# Patient Record
Sex: Male | Born: 1950 | Race: White | Hispanic: No | Marital: Married | Smoking: Former smoker
Health system: Southern US, Community
[De-identification: ages and names within clinical notes are randomized; demographics above are authoritative.]

## PROBLEM LIST (undated history)

## (undated) DIAGNOSIS — D649 Anemia, unspecified: Secondary | ICD-10-CM

## (undated) DIAGNOSIS — I2510 Atherosclerotic heart disease of native coronary artery without angina pectoris: Secondary | ICD-10-CM

## (undated) DIAGNOSIS — N289 Disorder of kidney and ureter, unspecified: Secondary | ICD-10-CM

## (undated) DIAGNOSIS — N400 Enlarged prostate without lower urinary tract symptoms: Secondary | ICD-10-CM

## (undated) DIAGNOSIS — E1140 Type 2 diabetes mellitus with diabetic neuropathy, unspecified: Secondary | ICD-10-CM

## (undated) DIAGNOSIS — I219 Acute myocardial infarction, unspecified: Secondary | ICD-10-CM

## (undated) DIAGNOSIS — M109 Gout, unspecified: Secondary | ICD-10-CM

## (undated) DIAGNOSIS — G4730 Sleep apnea, unspecified: Secondary | ICD-10-CM

## (undated) DIAGNOSIS — I10 Essential (primary) hypertension: Secondary | ICD-10-CM

## (undated) DIAGNOSIS — Z9861 Coronary angioplasty status: Secondary | ICD-10-CM

## (undated) DIAGNOSIS — M1990 Unspecified osteoarthritis, unspecified site: Secondary | ICD-10-CM

## (undated) DIAGNOSIS — R0602 Shortness of breath: Secondary | ICD-10-CM

## (undated) DIAGNOSIS — E039 Hypothyroidism, unspecified: Secondary | ICD-10-CM

## (undated) DIAGNOSIS — Z91018 Allergy to other foods: Secondary | ICD-10-CM

## (undated) DIAGNOSIS — I5030 Unspecified diastolic (congestive) heart failure: Secondary | ICD-10-CM

## (undated) DIAGNOSIS — R600 Localized edema: Secondary | ICD-10-CM

## (undated) DIAGNOSIS — E7800 Pure hypercholesterolemia, unspecified: Secondary | ICD-10-CM

## (undated) DIAGNOSIS — N189 Chronic kidney disease, unspecified: Secondary | ICD-10-CM

## (undated) DIAGNOSIS — M549 Dorsalgia, unspecified: Secondary | ICD-10-CM

## (undated) HISTORY — DX: Type 2 diabetes mellitus with diabetic neuropathy, unspecified: E11.40

## (undated) HISTORY — DX: Allergy to other foods: Z91.018

## (undated) HISTORY — DX: Dorsalgia, unspecified: M54.9

## (undated) HISTORY — DX: Benign prostatic hyperplasia without lower urinary tract symptoms: N40.0

## (undated) HISTORY — PX: APPENDECTOMY: SHX54

## (undated) HISTORY — DX: Hypothyroidism, unspecified: E03.9

## (undated) HISTORY — DX: Acute myocardial infarction, unspecified: I21.9

## (undated) HISTORY — DX: Anemia, unspecified: D64.9

## (undated) HISTORY — PX: CATARACT EXTRACTION: SUR2

## (undated) HISTORY — DX: Essential (primary) hypertension: I10

## (undated) HISTORY — DX: Disorder of kidney and ureter, unspecified: N28.9

## (undated) HISTORY — DX: Chronic kidney disease, unspecified: N18.9

## (undated) HISTORY — DX: Gout, unspecified: M10.9

## (undated) HISTORY — DX: Localized edema: R60.0

## (undated) HISTORY — DX: Pure hypercholesterolemia, unspecified: E78.00

## (undated) HISTORY — DX: Shortness of breath: R06.02

## (undated) HISTORY — DX: Sleep apnea, unspecified: G47.30

## (undated) HISTORY — DX: Unspecified diastolic (congestive) heart failure: I50.30

---

## 2003-04-18 ENCOUNTER — Encounter: Payer: Self-pay | Admitting: Family Medicine

## 2003-04-18 ENCOUNTER — Ambulatory Visit (HOSPITAL_COMMUNITY): Admission: RE | Admit: 2003-04-18 | Discharge: 2003-04-18 | Payer: Self-pay | Admitting: Family Medicine

## 2003-08-17 ENCOUNTER — Ambulatory Visit (HOSPITAL_COMMUNITY): Admission: RE | Admit: 2003-08-17 | Discharge: 2003-08-17 | Payer: Self-pay | Admitting: Family Medicine

## 2003-08-17 ENCOUNTER — Encounter: Payer: Self-pay | Admitting: Family Medicine

## 2003-08-25 ENCOUNTER — Ambulatory Visit (HOSPITAL_COMMUNITY): Admission: RE | Admit: 2003-08-25 | Discharge: 2003-08-25 | Payer: Self-pay | Admitting: *Deleted

## 2003-10-28 HISTORY — PX: CORONARY ANGIOPLASTY WITH STENT PLACEMENT: SHX49

## 2004-05-08 ENCOUNTER — Inpatient Hospital Stay (HOSPITAL_COMMUNITY): Admission: AD | Admit: 2004-05-08 | Discharge: 2004-05-10 | Payer: Self-pay | Admitting: Cardiology

## 2004-05-08 ENCOUNTER — Encounter: Payer: Self-pay | Admitting: Emergency Medicine

## 2004-11-12 ENCOUNTER — Ambulatory Visit: Payer: Self-pay | Admitting: *Deleted

## 2004-12-13 ENCOUNTER — Ambulatory Visit: Payer: Self-pay | Admitting: *Deleted

## 2004-12-30 ENCOUNTER — Ambulatory Visit: Payer: Self-pay | Admitting: *Deleted

## 2005-02-14 ENCOUNTER — Ambulatory Visit: Payer: Self-pay | Admitting: *Deleted

## 2005-02-20 ENCOUNTER — Inpatient Hospital Stay (HOSPITAL_BASED_OUTPATIENT_CLINIC_OR_DEPARTMENT_OTHER): Admission: RE | Admit: 2005-02-20 | Discharge: 2005-02-20 | Payer: Self-pay | Admitting: Cardiology

## 2005-02-20 ENCOUNTER — Ambulatory Visit: Payer: Self-pay | Admitting: Cardiology

## 2005-03-04 ENCOUNTER — Ambulatory Visit: Payer: Self-pay | Admitting: Physician Assistant

## 2006-01-06 ENCOUNTER — Ambulatory Visit (HOSPITAL_COMMUNITY): Admission: RE | Admit: 2006-01-06 | Discharge: 2006-01-06 | Payer: Self-pay | Admitting: Family Medicine

## 2006-01-19 ENCOUNTER — Ambulatory Visit: Payer: Self-pay | Admitting: *Deleted

## 2007-04-12 ENCOUNTER — Ambulatory Visit: Payer: Self-pay | Admitting: Cardiovascular Disease

## 2010-11-17 ENCOUNTER — Encounter: Payer: Self-pay | Admitting: *Deleted

## 2011-03-11 NOTE — Assessment & Plan Note (Signed)
Brandon Warren CARDIOLOGY OFFICE NOTE   NAME:Brandon Warren                      MRN:          [id number]  DATE:04/12/2007                            DOB:          1951-08-27    [name] is seen today as a new patient to me.  It took me awhile to  review his chart.  He has a history of coronary artery disease with  previous stenting of the obtuse marginal branch in 2005.  He had a  followup catheterization in 2006 as part of a research trial and had  patent stents.  At the time of his initial evaluation he had tingling in  his arms and some chest pain as his anginal equivalent.  He has not had  any recurrences of this.  His coronary risk factors are reasonably well  modified.  Dr. Caron Presume has been following his lipid profile and his LDL  cholesterols have been under 50, with normal LFTs.  He is a nonsmoker.  He quit 4 years ago.  Unfortunately when he quit, his weight ballooned  by about 100 pounds.  We had a long discussion regarding his weight.  It  was fairly obvious to both of [place] that he is not very motivated to quit  as he says he loves to eat and celebrates everything with food.  He  asked me about lap-band procedures and I told him if he was convinced  that he was not going to change his habits that he should probably look  into this.  I think currently his cardiac status is stable enough to  proceed.  The patient had been on Plavix up until about a year ago, at  which time he stopped his Plavix due to cost considerations.  He wanted  me to review his medications and indeed wanted to be on generic as much  as possible.  I told him to talk to Dr. Caron Presume about the Atacand since  he seemed to specifically want the patient on this medicine and I told  him there was no generic for this.  In regards to his Toprol 25 mg  daily, I switched him to Lopressor 12.5 b.i.d.   REVIEW OF SYSTEMS:  His review of systems is  otherwise remarkable for  some lower extremity edema.  He has had Lasix in the past which seemed a  little too strong for him and caused lightheadedness. I told him I would  give him a prescription for hydrochlorothiazide and he could take 12.5  mg as needed.  We also talked about a low-salt diet and his weight is  being contributory to his edema.  Review of systems otherwise negative.   MEDICATIONS:  1. Lipitor 20 a day.  2. Plavix has been discontinued.  He takes an aspirin a day.  3. He will be on Lopressor 12.5 b.i.d.  4. Atacand 16 a day.  5. Synthroid 200 mcg a day.   PHYSICAL EXAMINATION:  His exam is remarkable for a morbidly obese white  male with an appropriate affect, in no distress.  His weight is 316  pounds.  This is actually up another 7 pounds from March of 2007.  Blood  pressure is 155/98.  He says he did not take his blood pressure  medicines this morning.  Respiratory rate is 18, pulse is 91 and  regular.  He is afebrile.  NECK:  Supple, with no thyromegaly, no lymphadenopathy, no bruits.  LUNGS:  Clear, without wheezing, normal diaphragmatic motion.  HEART:  There is an S1, S2, distant heart sounds, PMI is not palpable.  ABDOMEN:  Protuberant, no tenderness.  Bowel sounds are positive, no  hepatosplenomegaly, no hepatojugular reflux.  Abdominal aorta is not  palpable.  EXTREMITIES:  Femorals are deep and difficult to palpate.  There is no  obvious bruit.  He has +1 lower extremity edema bilaterally.  PTs were  +2.  NEURO:  Nonfocal.  MUSCULOSKELETAL:  Shows no weakness.   IMPRESSION:  1. Coronary disease, previous stent to the OM, continue aspirin      therapy.  Followup Myoview in a year.  2. Hypercholesterolemia.  Continue Lipitor 20 mg a day.  LDL within      goal with normal LFTs.  3. Hypertension.  Well controlled on Atacand.  He will continue this      as well as his beta blocker.  Blood pressure up today as he did not      take his medicines in the  morning.  4. Hypothyroidism.  Continue current dose of Synthroid, followup TSH      in 6 months.  5. Lower extremity edema secondary to significant obesity and salt      intake.  Hydrochlorothiazide on a p.r.n. basis.  6. Essential obesity.  The patient will entertain the idea of lap-band      procedure.  I think currently he could be cleared for this without      any further cardiac testing.  He has not been on Plavix for awhile      and has been doing well.  I will see him back in a year's time and      he will call me if he has any issues.     Brandon Warren. Brandon Cancel, MD, [facility]  Electronically Signed    PCN/MedQ  DD: 04/12/2007  DT: 04/12/2007  Job #: [id number]   cc:   Bonne Dolores, M.D.

## 2011-03-14 NOTE — H&P (Signed)
NAME:  Brandon Warren, Brandon Warren                         ACCOUNT NO.:  [id number]   MEDICAL RECORD NO.:  [id number]                   PATIENT TYPE:  INP   LOCATION:  3713                                 FACILITY:  [facility]   PHYSICIAN:  Signa Kell, M.D.             DATE OF BIRTH:  April 13, 1951   DATE OF ADMISSION:  05/08/2004  DATE OF DISCHARGE:                                HISTORY & PHYSICAL   PRIMARY CARE PHYSICIAN:  Bonne Dolores, M.D.   PRIMARY CARDIOLOGIST:  Scarlett Presto, M.D.   HISTORY OF PRESENT ILLNESS:  Brandon Warren is a 60 year old male with known  coronary artery disease with history of myocardial infarction approximately  13 years ago treated with percutaneous transluminal coronary angioplasty of  the left circumflex artery reducing at 95% stenosis to 20%.  At that time he  had normal left ventricular function.  Most recently he has been seen by Dr.  Wilhemina Warren in November of 2004 with complaints of increasing dyspnea on  exertion.  At that time he was scheduled for a stress Cardiolite for  evaluation, however, this was not performed.  The patient did note some  improvement over the following few months.  Approximately one to two months  ago he noted again increasing dyspnea on exertion.  He states that some days  he could do his job easily with no dyspnea, however, on other days he would  get very short of breath with walking up only four steps.  He states over  the last three days he has noted episodes of diaphoresis and shortness of  breath with exertion.  He has also noted presyncopal episode after bending  over and standing up.  He denies any chest discomfort with these episodes.  He has also been recently treated with Lasix on a PRN basis for peripheral  edema.  He presents to the emergency department today because he woke up  this morning and again noted some diaphoresis with shortness of breath.  Upon arrival to the emergency department he was still noted to be  diaphoretic, however, his dyspnea had resolved.  the patient was not given  any medications in the emergency department.  He was placed on telemetry and  had blood work drawn and also given oxygen therapy.  Chest x-ray reveals no  acute disease with mild chronic obstructive pulmonary disease.  Chest CT  scan reveals no PE.   PAST MEDICAL HISTORY:  Coronary artery disease as outlined above.  No  objective evaluation since his percutaneous transluminal coronary  angioplasty in 1993.  Hypothyroidism.  Hypertension.  Hyperlipidemia.  Clinical symptoms of obstructive sleep apnea, however, no formal sleep study  has been done and he is receiving no treatment with CPAP.   MEDICATIONS PRIOR TO ADMISSION:  1. Synthroid 150 mcg daily.  2. Lasix 20 mg every other day.  3. Effexor 75 mg daily.  4. Altace 10 mg daily.  5. Lipitor  20 mg daily.  6. Toprol 25 mg daily.  7. Aspirin 81 mg daily.   ALLERGIES:  No known drug allergies.   SOCIAL HISTORY:  The patient lives in [place], [place] with his  wife.  He works as a Water engineer.  He is married and has three children,  one of which is with him today and is a Music therapist.  He has  approximately a 50+ pack year history of smoking cigarettes and quit one  year ago.  He denies any alcohol or drug use.   FAMILY HISTORY:  Mother is alive at 14 years of age with hypertension.  Father is alive at 22 years old with history of myocardial infarction.  He  is an only child.   REVIEW OF SYMPTOMS:  Positive for diaphoresis.  No fevers or chills.  HEENT:  No congestion, vision or hearing changes.  SKIN:  No rashes or lesions are  noted.  CARDIOPULMONARY:  Refer to history of present illness.  GENITOURINARY:  No frequency or dysuria.  NEUROPSYCHIATRIC:  Generalized  weakness with episode quickly resolves.  MUSCULOSKELETAL:  No myalgias or  arthralgias.  He does have some occasional abdominal cramping and occasional  leg cramps as well.   GASTROINTESTINAL:  Some nausea and some occasional  diarrhea.  No melena or bright red blood per rectum is noted.  He has noted  increase in his weight of approximately 65 pounds since he quit smoking a  year ago.  All other systems reviewed were negative.   PHYSICAL EXAMINATION:  VITAL SIGNS:  Temperature 97.8, pulse 89,  respirations 18, blood pressure 141/90.  Oxygen saturation 97% on 2 liters.  GENERAL:  This is a well-developed, well-nourished male in no acute  distress.  HEENT:  Normocephalic, atraumatic.  Pupils equal, round, reactive to light.  Extraocular movements intact.  NECK: No jugular venous distention.  No carotid bruits.  No lymphadenopathy  noted.  CARDIOVASCULAR:  S1 and S2 normal.  Regular rate and rhythm.  No murmurs,  rubs or gallops are appreciated.  LUNGS:  Clear to auscultation bilaterally.  ABDOMEN:  Obese, soft, nontender with active bowel sounds.  SKIN:  Warm and dry with no rashes noted.  EXTREMITIES:  No cyanosis, clubbing or edema.  Distal pulses intact  bilaterally.  GENITOURINARY/RECTAL:  Examination's are deferred.  MUSCULOSKELETAL:  No joint deformity or effusions are noted.  NEUROLOGICAL:  Patient is alert and oriented X3 and cranial nerves II-XII  are grossly intact.   CLINICAL DATA:  Chest CT scan reveals no PE.  He does have a few scattered  subpleural nodular foci, nonspecific, recommend a follow up CT scan in three  months.  Chest x-ray with no acute disease, mild chronic obstructive  pulmonary disease.   Electrocardiogram reveals sinus rhythm at 86 beats per minute with normal  axis deviation, no PR interval, QRS duration and QTC.  He does have some  nonspecific ST abnormalities.  No change from previous electrocardiogram  dated October 2004.   LABORATORY DATA:  White blood cell count 8.5, hemoglobin 15.9, hematocrit 45.3, platelet count 315,000.  Sodium 134, potassium 4.1, chloride 100, cO2  23, BUN 11, creatinine 1.0, glucose 119, total  bilirubin 0.6, AST 30, ALT  42, alkaline phosphatase 81, total protein 7.6, albumin 4.3, calcium 9.7, D-  dimer 0.36. BNP level is 31.7.  ABG's show pH 7.42, pCO2 37.0, pO2 76.1,  bicarb 23.7, SAO2 95.4% on room air.  Point of care markers:  Myoglobin  216/335/240;  CK-MB 2.0/2.1/2.0; troponin less than 0.05 X3.   IMPRESSION AND PLAN:  PROBLEM #1:  This is a 60 year old obese white male  with history of inferior wall myocardial infarction in 1993 treated with  percutaneous transluminal coronary angioplasty of a 95% occluded circumflex  reducing the stenosis to 20%.  He has recurrent dyspnea on exertion with  diaphoresis and some presyncope which is likely orthostatic.  He denies any  chest discomfort.  He has also gained approximately 65 pounds over the last  one year since he discontinued tobacco which could be contributing to his  decreased exercise tolerance.  Considering his history of coronary artery  disease and no objective evaluation for ischemia since 1993, we will admit  him to the hospital, obtain serial cardiac enzymes to rule out acute  myocardial infarction.  D-dimer is within normal limits as is his BNP.  Will  go ahead and schedule for a cardiac catheterization in the morning for  further evaluation.  Will continue him on his home medications including  aspirin.  He will be given nitroglycerin as needed for chest discomfort.   PROBLEM #2:  HYPERTENSION:  Will continue current home medications and  continue to monitor.   PROBLEM #3: HYPOTHYROIDISM:  Will continue Synthroid at his current dose and  check a TSH level.   PROBLEM #4: HYPERLIPIDEMIA:  Will continue on Lipitor.   Patient was interviewed and examined by Dr. Coralie Keens.  He agrees with the  above assessment and plan.      Amy Nelida Gores, P.A. [facility]                     Signa Kell, M.D.    AB/MEDQ  D:  05/08/2004  T:  05/08/2004  Job:  [id number]

## 2011-03-14 NOTE — Cardiovascular Report (Signed)
Brandon Warren, Brandon Warren NO.:  [id number]   MEDICAL RECORD NO.:  [id number]          PATIENT TYPE:  OIB   LOCATION:  6501                         FACILITY:  [facility]   PHYSICIAN:  Ethelle Lyon, M.D. LHCDATE OF BIRTH:  May 13, 1951   DATE OF PROCEDURE:  02/20/2005  DATE OF DISCHARGE:                              CARDIAC CATHETERIZATION   PROCEDURE:  1.  Coronary angiography.  2.  Intravascular ultrasound of the circumflex for follow up in the Zomaxx      study, StarClose closure of the right common arteriotomy site.   INDICATION:  [name] is a 61 year old gentleman who underwent stenting of  the first obtuse marginal in July 2005.  At that time, he consented to  participate in the Zomaxx trial.  He is now schedule for return angiography  and intravascular ultrasound.   TECHNIQUE:  Informed consent was obtained.  Under 1% lidocaine local  anesthesia, a 6 French sheath was placed in the right common femoral artery  using the modified Seldinger technique.  Diagnostic angiography was  performed using JR4 catheter for the native right coronary and a 6 French  EBU 3.5 guide for the native left system.  Intracoronary nitroglycerin was  administered before angiography of the left system.  Anticoagulation was  then initiated with unfractionated heparin to achieve and maintain an ACT of  greater than 275 seconds.  A Luge wire was advanced to the obtuse marginal  without difficulty.  Intravascular ultrasound probe was then advanced beyond  the stent.  Automated pull-back was performed after the administration of  intracoronary nitroglycerin.   The arteriotomy was then closed using a 6 [place] StarClose device.  Complete  hemostasis was obtained.  The patient did have a vagal episode treated with  1 mg of atropine with prompt improvement.  He was then transferred to the  holding room in stable condition.   COMPLICATIONS:  None.   FINDINGS:  1.  Left main:   Angiographically normal.  2.  LAD:  Ostial 20% stenosis.  The vessel was fairly large giving rise to a      single small diagonal.  3.  Ramus intermedius:  Large vessel which is angiographically normal.  4.  Circumflex:  Moderate size vessel giving rise to a single large obtuse      marginal.  This marginal has a stent in its proximal segment.  The stent      has less than 10% end-stent restenosis.  There is approximately 15%      restenosis just proximal to the stent.  5.  RCA:  Relatively small though dominant vessel.  It is angiographically      normal.   IMPRESSION/PLAN:  Widely patent stent in the first obtuse marginal.  There  is minimal restenosis.  We will continue with current therapy.      WED/MEDQ  D:  02/20/2005  T:  02/20/2005  Job:  [id number]   cc:   Scarlett Presto, M.D.  Fax: [phone]   Bonne Dolores, M.D.  [address], [place]  [place] [address]  Fax: [phone]

## 2011-03-14 NOTE — Procedures (Signed)
   NAME:  Brandon Warren, Brandon Warren                         ACCOUNT NO.:  [id number]   MEDICAL RECORD NO.:  [id number]                   PATIENT TYPE:  OUT   LOCATION:  RAD                                  FACILITY:  [facility]   PHYSICIAN:  Scarlett Presto, M.D.                DATE OF BIRTH:  [birth year]   DATE OF PROCEDURE:  DATE OF DISCHARGE:                                  ECHOCARDIOGRAM   REFERRING PHYSICIAN:  Bonne Dolores, M.D.   TAPE [id number], tape count [id number].   HISTORY:  This is a 60 year old male with edema, shortness of breath,  hypertension, coronary artery disease, and a history of myocardial  infarction.  The technical quality of the study is limited due to the  patient's habitus.   M-MODE TRACINGS:  1. The aorta is 30 mm.  2. The left atrium is 39 mm.  3. The septum is 16 mm.  4. Posterior wall is 11 mm.  5. The left ventricular diastolic dimension is 46 mm.  6. The left ventricular systolic dimension is 30 mm.   2-D AND DOPPLER IMAGING:  The left ventricle is normal size with mild  concentric left ventricular hypertrophy.  There are no wall motion  abnormalities seen.  Diastolic function is mildly impaired as per the  transmitral Doppler.  The estimated ejection fraction is 60 to 65%.   The right ventricle is normal size with normal systolic function.   Both atria are normal size with no obvious atrial septal defect.   The aortic valve is mildly sclerotic with no evidence of stenosis or  regurgitation.   The mitral valve is morphologically unremarkable with trace to mild  insufficiency.  No stenosis is seen.   The pulmonic valve is not well seen.   The tricuspid valve is morphologically unremarkable with no stenosis or  regurgitation.   The ascending aorta appears normal.   The inferior vena cava is not well seen.   The pericardial structures were not well seen.      ___________________________________________    Scarlett Presto, M.D.   JH/MEDQ  D:  08/25/2003  T:  08/25/2003  Job:  [id number]

## 2011-03-14 NOTE — Cardiovascular Report (Signed)
NAME:  Brandon Warren, Brandon Warren                         ACCOUNT NO.:  [id number]   MEDICAL RECORD NO.:  [id number]                   PATIENT TYPE:  INP   LOCATION:  6531                                 FACILITY:  [facility]   PHYSICIAN:  Ethelle Lyon, M.D. [facility]         DATE OF BIRTH:  1950/11/21   DATE OF PROCEDURE:  05/08/2004  DATE OF DISCHARGE:                              CARDIAC CATHETERIZATION   PROCEDURE:  Drug-eluting stent placement in the first obtuse marginal as  part of the Zomax-2 trial.   INDICATION:  Unstable angina.   PROCEDURAL TECHNIQUE:  Informed consent had been obtained by Dr. Minus Breeding prior to diagnostic angiography.  His diagnostic angiography today  demonstrated an 80% stenosis of the first-obtuse marginal.  Percutaneous  intervention was recommended, and I was consulted.   Anticoagulation was initiated with heparin and double bolus eptifibatide.  Plavix 300 mg was administered.  The preexisting 5-French sheath was upsized  to a 6-French sheath over a wire.  ACLS 3.5 guide was advanced over a wire  and engaged in the ostium of the left main.  A Luge wire was advanced to the  distal vessel without difficulty.  Repeat angiography was performed after  the administration of 200 mcg of intracoronary nitroglycerin.  The lesion  was then pre dilated using a  2.25 x 9 mm Maverick at 6 atmospheres.  The lesion was then stented using a  Zomax study stent which was 2.5 x16 mm.  It was deployed at 16 atmospheres.  The entirety of the stent was then post-dilated using a 2.5 x13 mm Power-  Sail for two sequential inflations, both at 16 atmospheres.  Intravascular  ultrasound was then performed using Galaxy system and automated pullback.  This demonstrated the stent to be less than fully expanded, and a region of  calcification in its proximal two-thirds.  The Power-Sail was then  reintroduced and used for further post-dilatation at 20 atmospheres for two  sequential  inflations covering the entirety of the proximal and mid portion  of the stent, sparring the distal 2 mm of the stent.  Repeat IVUS was then  performed.  This now showed that the stent was fully apposed and fully  expanded.  There was no evidence of dissection.  Final angiography after the  administration of 100 micrograms of intracoronary nitroglycerin demonstrated  no residual stenosis and TIMI 3 flow to the distal vasculature.   The patient tolerated the procedure well and was transferred to the holding  room in stable condition.   IMPRESSION/ PLAN:  Successful drug-eluting stent placement reducing the  stenosis from 80% to 0%.  The patient should be continued on Plavix for a  minimum of six months and preferably one year given his acute presentation.  He will be continued on aspirin indefinitely.  Eptifibatide will be  continued for 18 hours.  The sheaths will be removed with the ACT is less  than  150 seconds.                                               Ethelle Lyon, M.D. [facility]    WED/MEDQ  D:  05/09/2004  T:  05/10/2004  Job:  [id number]   cc:   Bonne Dolores, M.D.  [address] [place] [address]  Fax: [phone]   E. Raymond Gurney, M.D.

## 2011-03-14 NOTE — Discharge Summary (Signed)
NAME:  Brandon Warren, Brandon Warren                         ACCOUNT NO.:  [id number]   MEDICAL RECORD NO.:  [id number]                   PATIENT TYPE:  INP   LOCATION:  6531                                 FACILITY:  [facility]   PHYSICIAN:  Ernestine Mcmurray, M.D. [facility]             DATE OF BIRTH:  [birth year]   DATE OF ADMISSION:  05/08/2004  DATE OF DISCHARGE:  05/10/2004                                 DISCHARGE SUMMARY   DISCHARGE DIAGNOSES:  1. Coronary artery disease.     a. Unstable angina treated with drug-eluting stent to the first obtuse        marginal (ZOMAX II trial).     b. History of myocardial infarction in 1993 treated with percutaneous        transluminal coronary angioplasty to the circumflex.  2. Good left ventricular function.  3. Probable sleep apnea (needs outpatient referral).  4. Hypertension.  5. Hypothyroidism.  6. Dyslipidemia.  7. Nonspecific nodular foci noted by chest CT.     a. Needs follow-up CT in three months to assess significance.   PROCEDURES PERFORMED THIS ADMISSION:  1. Cardiac catheterization by Dr. Percival Spanish.  Please see his dictated note     for complete details.  2. Percutaneous coronary intervention by Dr. Albertine Patricia with placement of drug-     eluting stent to the first obtuse marginal as part of the [place]:  Please see the admission history and physical for complete  details.  Briefly, this 60 year old male patient with a known history of  coronary artery disease presented to [facility] with complaints of  dyspnea on exertion and associated diaphoresis.  He ruled out for myocardial  infarction.  D-dimer was negative.  BNP was negative.  Chest CT revealed no  evidence of pulmonary embolism but a few scattered subpleural nodular foci,  which were nonspecific, and a follow-up CT was recommended in three months.  His circumflex showed no evidence of active disease, mild COPD, an do  definite edema or cardiomegaly.  Dr. Velora Heckler  assessed the patient and  transferred him to [facility] for cardiac catheterization.  He also  noted that the patient was having symptoms consistent with obstructive sleep  apnea.  He will need outpatient referral.   At [facility] the patient underwent cardiac catheterization by Dr.  Percival Spanish.  This revealed a high-grade circumflex lesion, and Dr. Albertine Patricia  performed drug-eluting stent placement to the first obtuse marginal as noted  above.  He tolerated the procedure well and had no immediate complications.  On the morning of May 10, 2004, he was in stable condition.  His right  groin was without hematomas or bruits.  Postprocedure CK-MBs were negative.  He was discharged to home in stable condition.  He will need to be on Plavix  and aspirin 325 mg daily for 12 months as part of the ZOMAX-2 trial.  He  will need to follow up with Dr.  Wilhemina Cash in [place] in the next two weeks.  We will need to set him up for a sleep study referral at that time to rule  out sleep apnea.  He will need to see Dr. Caron Presume in the next two to three  weeks.   LABORATORY DATA:  White count 7400, hemoglobin 13, hematocrit 37.6, platelet  count 216,000.  INR 0.9.  D-dimer 0.36.  Sodium 139, potassium 3.7, chloride  108, CO2 26, glucose 130, BUN 16, creatinine 1, calcium 8.  Total bilirubin  0.7, alkaline phosphatase 78, AST 33, ALT 47, total protein 8, albumin 4.4.  Cardiac enzymes negative.  TSH 4.031.  Chest x-ray and chest CT as noted  above.   DISCHARGE MEDICATIONS:  1. Plavix 75 mg daily.  2. Coated aspirin 325 mg daily.  3. Synthroid 0.15 mg daily.  4. Effexor 75 mg daily.  5. Altace 10 mg daily.  6. Lipitor 20 mg daily.  7. Toprol XL 25 mg daily.  8. Lasix 20 mg daily.  9. Nitroglycerin p.r.n. chest pain.   PAIN MANAGEMENT:  Tylenol as needed.  Nitroglycerin p.r.n. chest pain.  He  is to call the office of 911 for recurrent chest pain.   ACTIVITY:  No driving, heavy lifting,  exertion, or work for three days.   DIET:  Low fat, low sodium.   WOUND CARE:  The patient is to call our office in [place] for any groin  swelling, bleeding, or bruising.   SPECIAL INSTRUCTIONS:  The patient has been enrolled in the ZOMAX-2 study  and should receive aspirin 325 mg and Plavix 75 mg daily for 12 months.  Follow-up will be with Dr. Wilhemina Cash in two weeks.  The office will contact him  with an appointment.  He will need to be set up for sleep study referral at  that time to rule out sleep apnea.  The patient should see his primary care  physician, Dr. Caron Presume, in the next two to three weeks, and he should call  for an appointment.  As noted above, he will need follow-up chest CT in  three months.      Richardson Dopp, P.A.                        Ernestine Mcmurray, M.D. [facility]    SW/MEDQ  D:  05/10/2004  T:  05/10/2004  Job:  [id number]   cc:   Scarlett Presto, M.D.  Fax: [phone]   Bonne Dolores, M.D.  [address], [place]  [place] [address]  Fax: [phone]

## 2011-03-14 NOTE — Cardiovascular Report (Signed)
NAME:  Brandon Warren, Brandon Warren                         ACCOUNT NO.:  [id number]   MEDICAL RECORD NO.:  [id number]                   PATIENT TYPE:  INP   LOCATION:  6531                                 FACILITY:  [facility]   PHYSICIAN:  Minus Breeding, M.D.                DATE OF BIRTH:  [birth year]   DATE OF PROCEDURE:  05/09/2004  DATE OF DISCHARGE:                              CARDIAC CATHETERIZATION   PRIMARY CARE PHYSICIAN:  Bonne Dolores, M.D.   PROCEDURE:  Left heart catheterization, coronary arteriography.   INDICATION:  Evaluate patient with unstable angina.  He had previous  percutaneous transluminal coronary angioplasty of the circumflex lesion in  1993.   PROCEDURE NOTE:  Left heart catheterization was performed via the right  femoral artery.  The artery was cannulated using a Smart needle.  A 5 French  arterial sheath was inserted via the modified Seldinger technique.  Preformed Judkins and a pigtail catheter were utilized.  The patient  tolerated the procedure.  He did have a vagal episode at the beginning of  the procedure, but recovered from this quickly.   RESULTS:   HEMODYNAMICS:  1. LV 136/14.  2. Aortic 138/84.   CORONARIES:  The left main had distal 25% stenosis with calcification.  The  LAD had mid long 25% stenosis and was calcified.  There were distal luminal  irregularities.  A mid diagonal was moderate size and normal.  The  circumflex and the proximal AV groove had luminal irregularities.  The  remainder of the vessel in the AV groove was small after mid obtuse  marginal.  There was a very large ramus intermediate which was branching and  had luminal irregularities.  There was large mid obtuse marginal with mid  90% stenosis.   Right coronary artery:  The right coronary artery is dominant though not  particularly large vessel.  There was a large RV branch.  There was small  PDA and posterior lateral.  This was a normal vessel.   LEFT VENTRICULOGRAM:  Left  ventriculogram was obtained in the RAO  projection.  The EF was 65% with normal wall motion.   CONCLUSION:  1. Severe single vessel coronary artery disease.  2. Well preserved ejection fraction.   PLAN:  I will review the films with Dr. Albertine Patricia to discuss percutaneous  revascularization of the circumflex.  The patient will have secondary risk  reduction.                                               Minus Breeding, M.D.    JH/MEDQ  D:  05/09/2004  T:  05/09/2004  Job:  [id number]   cc:   Bonne Dolores, M.D.  [address], [place] [address]  Fax: [phone]  [facility] in [place]

## 2013-05-02 DIAGNOSIS — H53001 Unspecified amblyopia, right eye: Secondary | ICD-10-CM | POA: Insufficient documentation

## 2013-05-02 DIAGNOSIS — H25819 Combined forms of age-related cataract, unspecified eye: Secondary | ICD-10-CM | POA: Insufficient documentation

## 2013-05-02 DIAGNOSIS — H53009 Unspecified amblyopia, unspecified eye: Secondary | ICD-10-CM | POA: Insufficient documentation

## 2013-05-19 ENCOUNTER — Encounter: Payer: Self-pay | Admitting: *Deleted

## 2013-05-19 DIAGNOSIS — E785 Hyperlipidemia, unspecified: Secondary | ICD-10-CM

## 2013-05-19 DIAGNOSIS — I25119 Atherosclerotic heart disease of native coronary artery with unspecified angina pectoris: Secondary | ICD-10-CM | POA: Insufficient documentation

## 2013-05-19 DIAGNOSIS — I10 Essential (primary) hypertension: Secondary | ICD-10-CM | POA: Insufficient documentation

## 2013-05-19 DIAGNOSIS — E669 Obesity, unspecified: Secondary | ICD-10-CM

## 2013-05-19 DIAGNOSIS — I25810 Atherosclerosis of coronary artery bypass graft(s) without angina pectoris: Secondary | ICD-10-CM

## 2013-05-19 DIAGNOSIS — R739 Hyperglycemia, unspecified: Secondary | ICD-10-CM | POA: Insufficient documentation

## 2013-05-19 DIAGNOSIS — E039 Hypothyroidism, unspecified: Secondary | ICD-10-CM | POA: Insufficient documentation

## 2013-05-20 ENCOUNTER — Encounter: Payer: Self-pay | Admitting: Cardiovascular Disease

## 2013-05-20 ENCOUNTER — Ambulatory Visit (INDEPENDENT_AMBULATORY_CARE_PROVIDER_SITE_OTHER): Payer: BC Managed Care – PPO | Admitting: Cardiovascular Disease

## 2013-05-20 VITALS — BP 118/79 | HR 52 | Ht 71.0 in | Wt 257.5 lb

## 2013-05-20 DIAGNOSIS — R42 Dizziness and giddiness: Secondary | ICD-10-CM

## 2013-05-20 DIAGNOSIS — R001 Bradycardia, unspecified: Secondary | ICD-10-CM | POA: Insufficient documentation

## 2013-05-20 DIAGNOSIS — I498 Other specified cardiac arrhythmias: Secondary | ICD-10-CM

## 2013-05-20 DIAGNOSIS — I10 Essential (primary) hypertension: Secondary | ICD-10-CM

## 2013-05-20 DIAGNOSIS — I25810 Atherosclerosis of coronary artery bypass graft(s) without angina pectoris: Secondary | ICD-10-CM

## 2013-05-20 NOTE — Patient Instructions (Addendum)
Your physician recommends that you schedule a follow-up appointment in: [place] physician has recommended you make the following change in your medication:   1) DECREASE METOPROLOL TO 12.5MG  TWICE DAILY (CUT TABLET IN HALF)

## 2013-05-20 NOTE — Progress Notes (Signed)
Patient ID: Brandon Warren, male   DOB: 01/13/1951, 62 y.o.   MRN: [id number]    CARDIOLOGY CONSULT NOTE  Patient ID: Brandon Warren MRN: [id number] DOB/AGE: [birth year] 62 y.o.  Admit date: (Not on file) Primary Physician: Elsie Lincoln, MD Reason for Consultation: CAD, bradycardia  HPI:  Brandon Warren is a 62 y.o. Gentleman with a history of coronary artery disease with  previous stenting of the obtuse marginal branch in 2005 with a Taxus DES, which occurred when he was 62 yrs old. He had a followup catheterization in 2006 as part of a research trial and had  patent stents. At the time of his initial evaluation he had tingling in  his arms and some chest pain as his anginal equivalent.   He has not had any recurrences of this. His coronary risk factors are reasonably well  modified. He has lost 46 lbs recently. He had previously put on a lot of weight when he was taking care of his wife, who was being treated for inflammatory breast cancer.  He eats 1200 calories or less a day for the past 3 months. If he's too overweight, he struggles with sciatica.  He is apparently intolerant of statins (muscle cramps).  He denies chest pain. He exercises on his exercise bike for an hour daily. He goes to the hospital every 3 days simply to climb stairs. He denies leg swelling. He denies palpitations.  He has a little bit of lightheadedness when he stands.  He's scheduled to undergo cataract surgery on Tuesday. 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   His father passed away two months ago, and had inner ear problems, and Brandon Warren thinks he has them too. He gets dizzy when he leans his head backwards and forwards.  His HR used to be in the low 80's at rest when he weighed more. Since the weight loss, his HR is in the 50's.   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  History   Social History  . Marital Status: Married    Spouse Name: N/A    Number of Children: N/A  . Years of Education: N/A   Occupational History  . Not on file.   Social History Main Topics  . Smoking status: Former Research scientist (life sciences)  . Smokeless tobacco: Not on file  . Alcohol Use: Not on file  . Drug Use: Not on file  . Sexually Active: Not on file   Other Topics Concern  . Not on file   Social History Narrative  . No narrative on file      (Not in a hospital admission)  Physical exam Blood pressure 118/79, pulse 52, height 5\' 11"  (1.803 m), weight 257 lb 8 oz (116.801 kg). General: NAD Neck: No JVD, no thyromegaly or thyroid nodule.  Lungs: Clear to auscultation bilaterally with normal respiratory effort. CV: Nondisplaced PMI.  Heart regular rhythm, bradycardic in 50's, S1/S2, no S3/S4, no murmur.  No peripheral edema.  No carotid bruit.  Normal pedal pulses.  Abdomen: Soft, nontender, no hepatosplenomegaly, no distention.  Skin: Intact without lesions or rashes.  Neurologic: Alert and oriented x 3.  Psych: Normal affect. Extremities: No clubbing or cyanosis.  HEENT: Normal.   Labs:   No results found for this basename: WBC, HGB, HCT, MCV, PLT  No results found for this basename: NA, K, CL, CO2, BUN, CREATININE, CALCIUM, LABALBU, PROT, BILITOT, ALKPHOS, ALT, AST, GLUCOSE,  in the last 168 hours No results found for this basename: CKTOTAL, CKMB, CKMBINDEX, TROPONINI    No results found for this basename: CHOL   No results found for this basename: HDL   No results found for this basename: LDLCALC   No results found for this basename: TRIG   No results found for this basename: CHOLHDL   No results found for this basename: LDLDIRECT     Lipids (7-10/2012): TC: 121 HDL: 45 LDL: 55 TG: 106  HbA1C: 5.6% TSH: 0.018  EKG: Sinus bradycardia, rate 53 bpm, axis within normal limits, intervals within normal limits, no acute  ST-T wave changes.     ASSESSMENT AND PLAN:  1. CAD with previous stenting of the OM: currently asymptomatic, and getting regular physical activity. He is on an ASA and a beta blocker, which I will reduce for his bradycardia. He is intolerant of statins (muscle cramps), and takes Zetia. I see no need for stress testing or echocardiography presently.  2. Bradycardia: I don't feel there's a need to obtain a Holter monitor at this time. I will simply reduce his Metoprolol to 12.5 mg bid. I've asked him to monitor his resting HR for the next month and to inform me of these values.  3. HTN: controlled on Amlodipine and Losartan.  4. Hypercholesterolemia: well controlled with Zetia.  5. Dizziness: may be due to inner ear disease. I've encouraged him to perform Epley maneuvers. Reducing his Metoprolol should also help.  Signed: Kate Sable, M.D., F.A.C.C. 05/20/2013, 2:52 PM

## 2013-05-23 ENCOUNTER — Encounter: Payer: Self-pay | Admitting: Cardiovascular Disease

## 2013-05-25 DIAGNOSIS — Z9849 Cataract extraction status, unspecified eye: Secondary | ICD-10-CM | POA: Insufficient documentation

## 2013-05-25 DIAGNOSIS — Z9842 Cataract extraction status, left eye: Secondary | ICD-10-CM | POA: Insufficient documentation

## 2013-06-08 DIAGNOSIS — Z961 Presence of intraocular lens: Secondary | ICD-10-CM | POA: Insufficient documentation

## 2013-06-30 ENCOUNTER — Telehealth: Payer: Self-pay | Admitting: Cardiovascular Disease

## 2013-06-30 MED ORDER — METOPROLOL TARTRATE 25 MG PO TABS: 12.5000 mg | ORAL_TABLET | Freq: Every morning | ORAL | Status: DC

## 2013-06-30 NOTE — Telephone Encounter (Signed)
Spoke to patient concerning lab/test results/instructions from provider. Patient understood.    

## 2013-06-30 NOTE — Telephone Encounter (Signed)
If he is taking Metoprolol 12.5 mg bid, he can reduce to 12.5 mg every morning.

## 2013-06-30 NOTE — Telephone Encounter (Signed)
Patient would like return phone call to discuss medications. / tgs

## 2013-06-30 NOTE — Telephone Encounter (Signed)
Pt states that he weighed 305 lbs in January, he now weighs 235 lbs. Since pt last was seen, he has lost another 10 lbs. Last appt was May 20, 2013. At that appt his HR was in the 50's and MD decreased metoprolol in 1/2. Pt states that it has been working but since losing the weight he might need at med change. Pt states he did not take meds last night or this morning and BP was 106/66 and heartrate of 52. Please advise.

## 2013-10-06 ENCOUNTER — Ambulatory Visit (INDEPENDENT_AMBULATORY_CARE_PROVIDER_SITE_OTHER): Payer: BC Managed Care – PPO | Admitting: Otolaryngology

## 2013-10-06 ENCOUNTER — Encounter (INDEPENDENT_AMBULATORY_CARE_PROVIDER_SITE_OTHER): Payer: Self-pay

## 2013-10-06 DIAGNOSIS — H903 Sensorineural hearing loss, bilateral: Secondary | ICD-10-CM

## 2013-12-27 ENCOUNTER — Telehealth: Payer: Self-pay

## 2013-12-27 NOTE — Telephone Encounter (Signed)
Pt was referred by Dr. Orson Ape for screening colonoscopy. He left Vm to call him.( Needs OV prior to TCS due to meds).

## 2014-01-23 NOTE — Telephone Encounter (Signed)
Called and lmom for a return call.

## 2014-01-24 ENCOUNTER — Encounter (INDEPENDENT_AMBULATORY_CARE_PROVIDER_SITE_OTHER): Payer: Self-pay

## 2014-01-24 ENCOUNTER — Encounter: Payer: Self-pay | Admitting: Gastroenterology

## 2014-01-24 ENCOUNTER — Ambulatory Visit (INDEPENDENT_AMBULATORY_CARE_PROVIDER_SITE_OTHER): Payer: 59 | Admitting: Gastroenterology

## 2014-01-24 VITALS — BP 131/77 | HR 79 | Temp 97.6°F | Ht 70.0 in | Wt 295.8 lb

## 2014-01-24 DIAGNOSIS — Z1211 Encounter for screening for malignant neoplasm of colon: Secondary | ICD-10-CM

## 2014-01-24 MED ORDER — PEG 3350-KCL-NA BICARB-NACL 420 G PO SOLR: 4000.0000 mL | ORAL | Status: DC

## 2014-01-24 NOTE — Patient Instructions (Signed)
We have scheduled you for a colonoscopy with Dr. Gala Romney.  Further recommendations to follow!

## 2014-01-24 NOTE — Progress Notes (Signed)
Primary Care Physician:  Leonides Grills, MD Primary Gastroenterologist:  Dr. Gala Romney   Chief Complaint  Patient presents with  . Colonoscopy    HPI:   Brandon Warren is a pleasant 63 year old male who presents today for a visit prior to scheduling a colonoscopy. No prior colonoscopy. No changes in bowel habits. No rectal bleeding, abdominal pain. Occasional indigestion related to food choices. No dysphagia.   Past Medical History  Diagnosis Date  . Hypertension   . Hypercholesterolemia   . Hypothyroidism   . MI (myocardial infarction)     in his 59s    Past Surgical History  Procedure Laterality Date  . Appendectomy    . Coronary angioplasty with stent placement    . Cataract extraction      Current Outpatient Prescriptions  Medication Sig Dispense Refill  . ALPRAZolam (XANAX) 0.5 MG tablet Take 0.5 mg by mouth at bedtime as needed for anxiety.      Marland Kitchen amLODipine (NORVASC) 5 MG tablet Take 5 mg by mouth 2 (two) times daily.      Marland Kitchen aspirin EC 81 MG tablet Take 81 mg by mouth daily.      . diazepam (VALIUM) 5 MG tablet Take 5 mg by mouth every 8 (eight) hours as needed for anxiety.      Marland Kitchen ezetimibe (ZETIA) 10 MG tablet Take 10 mg by mouth daily.      Marland Kitchen levothyroxine (SYNTHROID, LEVOTHROID) 200 MCG tablet Take 175 mcg by mouth daily before breakfast.       . losartan (COZAAR) 100 MG tablet Take 100 mg by mouth daily.      . metoprolol tartrate (LOPRESSOR) 25 MG tablet Take 0.5 tablets (12.5 mg total) by mouth every morning.  30 tablet  3  . tamsulosin (FLOMAX) 0.4 MG CAPS Take 0.4 mg by mouth daily.      . traZODone (DESYREL) 50 MG tablet Take 50 mg by mouth at bedtime.       No current facility-administered medications for this visit.    Allergies as of 01/24/2014 - Review Complete 01/24/2014  Allergen Reaction Noted  . Lipitor [atorvastatin]  05/19/2013  . Simvastatin  05/19/2013    Family History  Problem Relation Age of Onset  . Colon cancer Neg Hx   .  Colon polyps Mother     History   Social History  . Marital Status: Married    Spouse Name: N/A    Number of Children: N/A  . Years of Education: N/A   Occupational History  . self-employed    Social History Main Topics  . Smoking status: Former Research scientist (life sciences)  . Smokeless tobacco: Not on file  . Alcohol Use: Yes     Comment: occasional  . Drug Use: No  . Sexual Activity: Not on file   Other Topics Concern  . Not on file   Social History Narrative  . No narrative on file    Review of Systems: Gen: see HPI CV: Denies chest pain, heart palpitations, peripheral edema, syncope.  Resp: +DOE GI: see HPI GU : Denies urinary burning, urinary frequency, urinary hesitancy MS: sciatic pain, left hip pain  Derm: Denies rash, itching, dry skin Psych: anxiety Heme: Denies bruising, bleeding, and enlarged lymph nodes.  Physical Exam: BP 131/77  Pulse 79  Temp(Src) 97.6 F (36.4 C) (Oral)  Ht 5\' 10"  (1.778 m)  Wt 295 lb 12.8 oz (134.174 kg)  BMI 42.44 kg/m2 General:   Alert and oriented.  Pleasant and cooperative. Well-nourished and well-developed.  Head:  Normocephalic and atraumatic. Eyes:  Without icterus, sclera clear and conjunctiva pink.  Ears:  Normal auditory acuity. Nose:  No deformity, discharge,  or lesions. Mouth:  No deformity or lesions, oral mucosa pink.  Neck:  Supple, without mass or thyromegaly. Lungs:  Clear to auscultation bilaterally. No wheezes, rales, or rhonchi. No distress.  Heart:  S1, S2 present without murmurs appreciated.  Abdomen:  +BS, soft, obese with large AP diameter, difficult to appreciate HSM due to large AP diameter. non-tender and non-distended.  Rectal:  Deferred  Msk:  Symmetrical without gross deformities. Normal posture. Extremities:  Without clubbing or edema. Neurologic:  Alert and  oriented x4;  grossly normal neurologically. Skin:  Intact without significant lesions or rashes. Cervical Nodes:  No significant cervical  adenopathy. Psych:  Alert and cooperative. Normal mood and affect.

## 2014-01-25 NOTE — Assessment & Plan Note (Signed)
63 year old male with need for initial screening, average risk colonoscopy. No lower GI symptoms of concern. No FH of colon cancer. Due to multiple meds, he would likely do well with addition of Phenergan 12.5 mg IV at time of procedure.  Proceed with TCS with Dr. Gala Romney in near future: the risks, benefits, and alternatives have been discussed with the patient in detail. The patient states understanding and desires to proceed. Phenergan 12.5 mg IV

## 2014-01-26 NOTE — Progress Notes (Signed)
CC'd to pcp 

## 2014-01-27 ENCOUNTER — Encounter (HOSPITAL_COMMUNITY): Payer: Self-pay | Admitting: Pharmacy Technician

## 2014-02-01 ENCOUNTER — Encounter (HOSPITAL_COMMUNITY)
Admission: RE | Disposition: A | Discharge status: Home / Self Care | Payer: Self-pay | Source: Ambulatory Visit | Attending: Internal Medicine

## 2014-02-01 ENCOUNTER — Encounter (HOSPITAL_COMMUNITY): Payer: Self-pay

## 2014-02-01 ENCOUNTER — Ambulatory Visit (HOSPITAL_COMMUNITY)
Admission: RE | Admit: 2014-02-01 | Discharge: 2014-02-01 | Disposition: A | Discharge status: Home / Self Care | Payer: 59 | Source: Ambulatory Visit | Attending: Internal Medicine | Admitting: Internal Medicine

## 2014-02-01 DIAGNOSIS — D129 Benign neoplasm of anus and anal canal: Secondary | ICD-10-CM

## 2014-02-01 DIAGNOSIS — Z87891 Personal history of nicotine dependence: Secondary | ICD-10-CM | POA: Insufficient documentation

## 2014-02-01 DIAGNOSIS — K621 Rectal polyp: Secondary | ICD-10-CM

## 2014-02-01 DIAGNOSIS — E7800 Pure hypercholesterolemia, unspecified: Secondary | ICD-10-CM | POA: Insufficient documentation

## 2014-02-01 DIAGNOSIS — K620 Anal polyp: Secondary | ICD-10-CM | POA: Insufficient documentation

## 2014-02-01 DIAGNOSIS — Z79899 Other long term (current) drug therapy: Secondary | ICD-10-CM | POA: Insufficient documentation

## 2014-02-01 DIAGNOSIS — I10 Essential (primary) hypertension: Secondary | ICD-10-CM | POA: Insufficient documentation

## 2014-02-01 DIAGNOSIS — D126 Benign neoplasm of colon, unspecified: Secondary | ICD-10-CM | POA: Insufficient documentation

## 2014-02-01 DIAGNOSIS — D128 Benign neoplasm of rectum: Secondary | ICD-10-CM

## 2014-02-01 DIAGNOSIS — Z1211 Encounter for screening for malignant neoplasm of colon: Secondary | ICD-10-CM

## 2014-02-01 DIAGNOSIS — Z7982 Long term (current) use of aspirin: Secondary | ICD-10-CM | POA: Insufficient documentation

## 2014-02-01 HISTORY — DX: Unspecified osteoarthritis, unspecified site: M19.90

## 2014-02-01 HISTORY — PX: COLONOSCOPY: SHX5424

## 2014-02-01 SURGERY — COLONOSCOPY
Anesthesia: Moderate Sedation

## 2014-02-01 MED ORDER — ONDANSETRON HCL 4 MG/2ML IJ SOLN
INTRAMUSCULAR | Status: AC
  Filled 2014-02-01: qty 2

## 2014-02-01 MED ORDER — PROMETHAZINE HCL 25 MG/ML IJ SOLN
25.0000 mg | Freq: Once | INTRAMUSCULAR | Status: AC
  Administered 2014-02-01: 25 mg via INTRAVENOUS

## 2014-02-01 MED ORDER — ONDANSETRON HCL 4 MG/2ML IJ SOLN
INTRAMUSCULAR | Status: DC | PRN
  Administered 2014-02-01: 4 mg via INTRAVENOUS

## 2014-02-01 MED ORDER — MEPERIDINE HCL 100 MG/ML IJ SOLN
INTRAMUSCULAR | Status: DC | PRN
  Administered 2014-02-01: 50 mg via INTRAVENOUS
  Administered 2014-02-01: 25 mg via INTRAVENOUS
  Administered 2014-02-01: 50 mg via INTRAVENOUS

## 2014-02-01 MED ORDER — SODIUM CHLORIDE 0.9 % IV SOLN
INTRAVENOUS | Status: DC
  Administered 2014-02-01: 08:00:00 via INTRAVENOUS

## 2014-02-01 MED ORDER — MIDAZOLAM HCL 5 MG/5ML IJ SOLN
INTRAMUSCULAR | Status: DC | PRN
  Administered 2014-02-01: 2 mg via INTRAVENOUS
  Administered 2014-02-01: 1 mg via INTRAVENOUS
  Administered 2014-02-01: 2 mg via INTRAVENOUS

## 2014-02-01 MED ORDER — MIDAZOLAM HCL 5 MG/5ML IJ SOLN
INTRAMUSCULAR | Status: AC
  Filled 2014-02-01: qty 10

## 2014-02-01 MED ORDER — MEPERIDINE HCL 100 MG/ML IJ SOLN
INTRAMUSCULAR | Status: AC
  Filled 2014-02-01: qty 2

## 2014-02-01 MED ORDER — PROMETHAZINE HCL 25 MG/ML IJ SOLN
INTRAMUSCULAR | Status: AC
  Filled 2014-02-01: qty 1

## 2014-02-01 MED ORDER — STERILE WATER FOR IRRIGATION IR SOLN
Status: DC | PRN
  Administered 2014-02-01: 08:00:00

## 2014-02-01 MED ORDER — SODIUM CHLORIDE 0.9 % IJ SOLN
INTRAMUSCULAR | Status: AC
  Filled 2014-02-01: qty 10

## 2014-02-01 NOTE — Op Note (Signed)
[facility] [address] [place], [address]   COLONOSCOPY PROCEDURE REPORT  PATIENT: [name], [name]  MR#:         [id number] BIRTHDATE: December 02, 1950 , 89  yrs. old GENDER: Male ENDOSCOPIST: R.  Garfield Cornea, MD FACP FACG REFERRED BY:  Elsie Lincoln, M.D. PROCEDURE DATE:  02/01/2014 PROCEDURE:     Colonoscopy biopsy and snare polypectomy  INDICATIONS: First-ever average risk screening colonoscopy  INFORMED CONSENT:  The risks, benefits, alternatives and imponderables including but not limited to bleeding, perforation as well as the possibility of a missed lesion have been reviewed.  The potential for biopsy, lesion removal, etc. have also been discussed.  Questions have been answered.  All parties agreeable. Please see the history and physical in the medical record for more information.  MEDICATIONS: Versed 5 mg IV and Demerol 125 mg IV in divided doses. Phenergan 12.5 mg IV.  Zofran 4 mg IV  DESCRIPTION OF PROCEDURE:  After a digital rectal exam was performed, the [id number]  colonoscope was advanced from the anus through the rectum and colon to the area of the cecum, ileocecal valve and appendiceal orifice.  The cecum was deeply intubated.  These structures were well-seen and photographed for the record.  From the level of the cecum and ileocecal valve, the scope was slowly and cautiously withdrawn.  The mucosal surfaces were carefully surveyed utilizing scope tip deflection to facilitate fold flattening as needed.  The scope was pulled down into the rectum where a thorough examination including retroflexion was performed.    FINDINGS:  Adequate preparation (2) diminutive polyps at 5 cm from anal verge; otherwise, the remainder of the rectal mucosa appeared normal. Scattered left-sided diverticula; (1) 4 mm polyp in the mid ascending segment; otherwise, the remainder of the colonic mucosa appeared normal.  THERAPEUTIC / DIAGNOSTIC  MANEUVERS PERFORMED:  The ascending colon polyp was cold snare removed. Rectals polyps cold biopsy removed.  COMPLICATIONS: none  CECAL WITHDRAWAL TIME:  25 minutes  IMPRESSION:  Colonic polyps-removed as described above. Colonic diverticulosis  RECOMMENDATIONS: Followup on pathology.   _______________________________ eSigned:  R. Garfield Cornea, MD FACP [facility] 02/01/2014 9:38 AM   CC:    PATIENT NAME:  Brandon Warren, Brandon Warren MR#: [id number]

## 2014-02-01 NOTE — Discharge Instructions (Addendum)
Colonoscopy Discharge Instructions  Read the instructions outlined below and refer to this sheet in the next few weeks. These discharge instructions provide you with general information on caring for yourself after you leave the hospital. Your doctor may also give you specific instructions. While your treatment has been planned according to the most current medical practices available, unavoidable complications occasionally occur. If you have any problems or questions after discharge, call Dr. Gala Romney at [phone]. ACTIVITY  You may resume your regular activity, but move at a slower pace for the next 24 hours.   Take frequent rest periods for the next 24 hours.   Walking will help get rid of the air and reduce the bloated feeling in your belly (abdomen).   No driving for 24 hours (because of the medicine (anesthesia) used during the test).    Do not sign any important legal documents or operate any machinery for 24 hours (because of the anesthesia used during the test).  NUTRITION  Drink plenty of fluids.   You may resume your normal diet as instructed by your doctor.   Begin with a light meal and progress to your normal diet. Heavy or fried foods are harder to digest and may make you feel sick to your stomach (nauseated).   Avoid alcoholic beverages for 24 hours or as instructed.  MEDICATIONS  You may resume your normal medications unless your doctor tells you otherwise.  WHAT YOU CAN EXPECT TODAY  Some feelings of bloating in the abdomen.   Passage of more gas than usual.   Spotting of blood in your stool or on the toilet paper.  IF YOU HAD POLYPS REMOVED DURING THE COLONOSCOPY:  No aspirin products for 7 days or as instructed.   No alcohol for 7 days or as instructed.   Eat a soft diet for the next 24 hours.  FINDING OUT THE RESULTS OF YOUR TEST Not all test results are available during your visit. If your test results are not back during the visit, make an appointment  with your caregiver to find out the results. Do not assume everything is normal if you have not heard from your caregiver or the medical facility. It is important for you to follow up on all of your test results.  SEEK IMMEDIATE MEDICAL ATTENTION IF:  You have more than a spotting of blood in your stool.   Your belly is swollen (abdominal distention).   You are nauseated or vomiting.   You have a temperature over 101.   You have abdominal pain or discomfort that is severe or gets worse throughout the day.     Polyp and diverticulosis information provided  Further recommendations to follow pending review of pathology report       Diverticulosis Diverticulosis is a common condition that develops when small pouches (diverticula) form in the wall of the colon. The risk of diverticulosis increases with age. It happens more often in people who eat a low-fiber diet. Most individuals with diverticulosis have no symptoms. Those individuals with symptoms usually experience abdominal pain, constipation, or loose stools (diarrhea). HOME CARE INSTRUCTIONS   Increase the amount of fiber in your diet as directed by your caregiver or dietician. This may reduce symptoms of diverticulosis.  Your caregiver may recommend taking a dietary fiber supplement.  Drink at least 6 to 8 glasses of water each day to prevent constipation.  Try not to strain when you have a bowel movement.  Your caregiver may recommend avoiding nuts and seeds  to prevent complications, although this is still an uncertain benefit.  Only take over-the-counter or prescription medicines for pain, discomfort, or fever as directed by your caregiver. FOODS WITH HIGH FIBER CONTENT INCLUDE:  Fruits. Apple, peach, pear, tangerine, raisins, prunes.  Vegetables. Brussels sprouts, asparagus, broccoli, cabbage, carrot, cauliflower, romaine lettuce, spinach, summer squash, tomato, winter squash, zucchini.  Starchy Vegetables. Baked  beans, kidney beans, lima beans, split peas, lentils, potatoes (with skin).  Grains. Whole wheat bread, brown rice, bran flake cereal, plain oatmeal, white rice, shredded wheat, bran muffins. SEEK IMMEDIATE MEDICAL CARE IF:   You develop increasing pain or severe bloating.  You have an oral temperature above 102 F (38.9 C), not controlled by medicine.  You develop vomiting or bowel movements that are bloody or black. Document Released: 07/10/2004 Document Revised: 01/05/2012 Document Reviewed: 03/13/2010 [facility] Patient Information 2014 [place]. Colon Polyps Polyps are lumps of extra tissue growing inside the body. Polyps can grow in the large intestine (colon). Most colon polyps are noncancerous (benign). However, some colon polyps can become cancerous over time. Polyps that are larger than a pea may be harmful. To be safe, caregivers remove and test all polyps. CAUSES  Polyps form when mutations in the genes cause your cells to grow and divide even though no more tissue is needed. RISK FACTORS There are a number of risk factors that can increase your chances of getting colon polyps. They include:  Being older than 50 years.  Family history of colon polyps or colon cancer.  Long-term colon diseases, such as colitis or Crohn disease.  Being overweight.  Smoking.  Being inactive.  Drinking too much alcohol. SYMPTOMS  Most small polyps do not cause symptoms. If symptoms are present, they may include:  Blood in the stool. The stool may look dark red or black.  Constipation or diarrhea that lasts longer than 1 week. DIAGNOSIS People often do not know they have polyps until their caregiver finds them during a regular checkup. Your caregiver can use 4 tests to check for polyps:  Digital rectal exam. The caregiver wears gloves and feels inside the rectum. This test would find polyps only in the rectum.  Barium enema. The caregiver puts a liquid called barium into your  rectum before taking X-rays of your colon. Barium makes your colon look white. Polyps are dark, so they are easy to see in the X-ray pictures.  Sigmoidoscopy. A thin, flexible tube (sigmoidoscope) is placed into your rectum. The sigmoidoscope has a light and tiny camera in it. The caregiver uses the sigmoidoscope to look at the last third of your colon.  Colonoscopy. This test is like sigmoidoscopy, but the caregiver looks at the entire colon. This is the most common method for finding and removing polyps. TREATMENT  Any polyps will be removed during a sigmoidoscopy or colonoscopy. The polyps are then tested for cancer. PREVENTION  To help lower your risk of getting more colon polyps:  Eat plenty of fruits and vegetables. Avoid eating fatty foods.  Do not smoke.  Avoid drinking alcohol.  Exercise every day.  Lose weight if recommended by your caregiver.  Eat plenty of calcium and folate. Foods that are rich in calcium include milk, cheese, and broccoli. Foods that are rich in folate include chickpeas, kidney beans, and spinach. HOME CARE INSTRUCTIONS Keep all follow-up appointments as directed by your caregiver. You may need periodic exams to check for polyps. SEEK MEDICAL CARE IF: You notice bleeding during a bowel movement. Document Released:  07/09/2004 Document Revised: 01/05/2012 Document Reviewed: 12/23/2011 [facility] [facility] Patient Information 2014 [place].

## 2014-02-01 NOTE — Interval H&P Note (Signed)
History and Physical Interval Note:  02/01/2014 8:36 AM  Brandon Warren  has presented today for surgery, with the diagnosis of SCREENING COLONOSCOPY  The various methods of treatment have been discussed with the patient and family. After consideration of risks, benefits and other options for treatment, the patient has consented to  Procedure(s) with comments: COLONOSCOPY (N/A) - 8:30 as a surgical intervention .  The patient's history has been reviewed, patient examined, no change in status, stable for surgery.  I have reviewed the patient's chart and labs.  Questions were answered to the patient's satisfaction.     No change. Colonoscopy per plan.The risks, benefits, limitations, alternatives and imponderables have been reviewed with the patient. Questions have been answered. All parties are agreeable.   Brandon Warren

## 2014-02-01 NOTE — Telephone Encounter (Signed)
Pt had OV on 01/24/2014 with Laban Emperor, NP and has TCS scheduled for 02/01/2014 with Dr. Gala Romney.

## 2014-02-01 NOTE — H&P (View-Only) (Signed)
Primary Care Physician:  Leonides Grills, MD Primary Gastroenterologist:  Dr. Gala Romney   Chief Complaint  Patient presents with  . Colonoscopy    HPI:   Brandon Warren is a pleasant 63 year old male who presents today for a visit prior to scheduling a colonoscopy. No prior colonoscopy. No changes in bowel habits. No rectal bleeding, abdominal pain. Occasional indigestion related to food choices. No dysphagia.   Past Medical History  Diagnosis Date  . Hypertension   . Hypercholesterolemia   . Hypothyroidism   . MI (myocardial infarction)     in his 3s    Past Surgical History  Procedure Laterality Date  . Appendectomy    . Coronary angioplasty with stent placement    . Cataract extraction      Current Outpatient Prescriptions  Medication Sig Dispense Refill  . ALPRAZolam (XANAX) 0.5 MG tablet Take 0.5 mg by mouth at bedtime as needed for anxiety.      Marland Kitchen amLODipine (NORVASC) 5 MG tablet Take 5 mg by mouth 2 (two) times daily.      Marland Kitchen aspirin EC 81 MG tablet Take 81 mg by mouth daily.      . diazepam (VALIUM) 5 MG tablet Take 5 mg by mouth every 8 (eight) hours as needed for anxiety.      Marland Kitchen ezetimibe (ZETIA) 10 MG tablet Take 10 mg by mouth daily.      Marland Kitchen levothyroxine (SYNTHROID, LEVOTHROID) 200 MCG tablet Take 175 mcg by mouth daily before breakfast.       . losartan (COZAAR) 100 MG tablet Take 100 mg by mouth daily.      . metoprolol tartrate (LOPRESSOR) 25 MG tablet Take 0.5 tablets (12.5 mg total) by mouth every morning.  30 tablet  3  . tamsulosin (FLOMAX) 0.4 MG CAPS Take 0.4 mg by mouth daily.      . traZODone (DESYREL) 50 MG tablet Take 50 mg by mouth at bedtime.       No current facility-administered medications for this visit.    Allergies as of 01/24/2014 - Review Complete 01/24/2014  Allergen Reaction Noted  . Lipitor [atorvastatin]  05/19/2013  . Simvastatin  05/19/2013    Family History  Problem Relation Age of Onset  . Colon cancer Neg Hx   .  Colon polyps Mother     History   Social History  . Marital Status: Married    Spouse Name: N/A    Number of Children: N/A  . Years of Education: N/A   Occupational History  . self-employed    Social History Main Topics  . Smoking status: Former Research scientist (life sciences)  . Smokeless tobacco: Not on file  . Alcohol Use: Yes     Comment: occasional  . Drug Use: No  . Sexual Activity: Not on file   Other Topics Concern  . Not on file   Social History Narrative  . No narrative on file    Review of Systems: Gen: see HPI CV: Denies chest pain, heart palpitations, peripheral edema, syncope.  Resp: +DOE GI: see HPI GU : Denies urinary burning, urinary frequency, urinary hesitancy MS: sciatic pain, left hip pain  Derm: Denies rash, itching, dry skin Psych: anxiety Heme: Denies bruising, bleeding, and enlarged lymph nodes.  Physical Exam: BP 131/77  Pulse 79  Temp(Src) 97.6 F (36.4 C) (Oral)  Ht 5\' 10"  (1.778 m)  Wt 295 lb 12.8 oz (134.174 kg)  BMI 42.44 kg/m2 General:   Alert and oriented.  Pleasant and cooperative. Well-nourished and well-developed.  Head:  Normocephalic and atraumatic. Eyes:  Without icterus, sclera clear and conjunctiva pink.  Ears:  Normal auditory acuity. Nose:  No deformity, discharge,  or lesions. Mouth:  No deformity or lesions, oral mucosa pink.  Neck:  Supple, without mass or thyromegaly. Lungs:  Clear to auscultation bilaterally. No wheezes, rales, or rhonchi. No distress.  Heart:  S1, S2 present without murmurs appreciated.  Abdomen:  +BS, soft, obese with large AP diameter, difficult to appreciate HSM due to large AP diameter. non-tender and non-distended.  Rectal:  Deferred  Msk:  Symmetrical without gross deformities. Normal posture. Extremities:  Without clubbing or edema. Neurologic:  Alert and  oriented x4;  grossly normal neurologically. Skin:  Intact without significant lesions or rashes. Cervical Nodes:  No significant cervical  adenopathy. Psych:  Alert and cooperative. Normal mood and affect.

## 2014-02-02 ENCOUNTER — Encounter: Payer: Self-pay | Admitting: Internal Medicine

## 2014-02-06 ENCOUNTER — Encounter (HOSPITAL_COMMUNITY): Payer: Self-pay | Admitting: Internal Medicine

## 2014-04-10 ENCOUNTER — Telehealth: Payer: Self-pay | Admitting: General Practice

## 2014-04-10 NOTE — Telephone Encounter (Signed)
I called and lmom for patient to return my call.

## 2014-04-10 NOTE — Telephone Encounter (Signed)
According to Elmyra Ricks she spoke with Teia with [facility] [phone].  She stated the claim was denied because there was no referral for the procedure done online.

## 2014-04-10 NOTE — Telephone Encounter (Signed)
Per Brandon Warren with [facility], the patient will need to file an appeal for this procedure.

## 2014-04-10 NOTE — Telephone Encounter (Signed)
Patient called in with a question concerning his bill.  After researching his claim it looks like the insurance denied it because there was no referral entered.  I spoke with Elmyra Ricks with [employer] at [phone] and she stated she was going to give [facility] a call and get back to me.

## 2014-04-10 NOTE — Telephone Encounter (Signed)
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  This referral should have been done by the pcp.

## 2014-04-10 NOTE — Telephone Encounter (Signed)
I spoke with Debbie at the [facility] and she verified that Brandon Warren from the [facility] spoke with [place]. with [employer] on 01/31/14 at 9:13 am to verify benefits and to see if a PA was required.  She was told by the patient that the procedure was covered at 100% and no PAC required.

## 2014-04-11 NOTE — Telephone Encounter (Signed)
Pt is aware and he is going to start the appeal process with [facility] [facility].

## 2014-10-12 ENCOUNTER — Ambulatory Visit (INDEPENDENT_AMBULATORY_CARE_PROVIDER_SITE_OTHER): Payer: 59 | Admitting: Otolaryngology

## 2015-04-05 DIAGNOSIS — R972 Elevated prostate specific antigen [PSA]: Secondary | ICD-10-CM | POA: Diagnosis not present

## 2015-04-05 DIAGNOSIS — N390 Urinary tract infection, site not specified: Secondary | ICD-10-CM | POA: Diagnosis not present

## 2015-04-23 ENCOUNTER — Encounter (HOSPITAL_COMMUNITY): Payer: Self-pay | Admitting: Emergency Medicine

## 2015-04-23 ENCOUNTER — Emergency Department (HOSPITAL_COMMUNITY): Payer: Commercial Managed Care - HMO

## 2015-04-23 ENCOUNTER — Emergency Department (HOSPITAL_COMMUNITY)
Admission: EM | Admit: 2015-04-23 | Discharge: 2015-04-23 | Disposition: A | Discharge status: Home / Self Care | Payer: Commercial Managed Care - HMO | Attending: Emergency Medicine | Admitting: Emergency Medicine

## 2015-04-23 DIAGNOSIS — M25571 Pain in right ankle and joints of right foot: Secondary | ICD-10-CM | POA: Diagnosis not present

## 2015-04-23 DIAGNOSIS — Z79899 Other long term (current) drug therapy: Secondary | ICD-10-CM | POA: Insufficient documentation

## 2015-04-23 DIAGNOSIS — Y9289 Other specified places as the place of occurrence of the external cause: Secondary | ICD-10-CM | POA: Insufficient documentation

## 2015-04-23 DIAGNOSIS — S93401A Sprain of unspecified ligament of right ankle, initial encounter: Secondary | ICD-10-CM | POA: Insufficient documentation

## 2015-04-23 DIAGNOSIS — E039 Hypothyroidism, unspecified: Secondary | ICD-10-CM | POA: Diagnosis not present

## 2015-04-23 DIAGNOSIS — Y998 Other external cause status: Secondary | ICD-10-CM | POA: Insufficient documentation

## 2015-04-23 DIAGNOSIS — I10 Essential (primary) hypertension: Secondary | ICD-10-CM | POA: Diagnosis not present

## 2015-04-23 DIAGNOSIS — W010XXA Fall on same level from slipping, tripping and stumbling without subsequent striking against object, initial encounter: Secondary | ICD-10-CM | POA: Insufficient documentation

## 2015-04-23 DIAGNOSIS — Z7982 Long term (current) use of aspirin: Secondary | ICD-10-CM | POA: Insufficient documentation

## 2015-04-23 DIAGNOSIS — S99911A Unspecified injury of right ankle, initial encounter: Secondary | ICD-10-CM | POA: Diagnosis not present

## 2015-04-23 DIAGNOSIS — I252 Old myocardial infarction: Secondary | ICD-10-CM | POA: Diagnosis not present

## 2015-04-23 DIAGNOSIS — Z9861 Coronary angioplasty status: Secondary | ICD-10-CM | POA: Diagnosis not present

## 2015-04-23 DIAGNOSIS — Y9389 Activity, other specified: Secondary | ICD-10-CM | POA: Diagnosis not present

## 2015-04-23 DIAGNOSIS — E780 Pure hypercholesterolemia: Secondary | ICD-10-CM | POA: Insufficient documentation

## 2015-04-23 DIAGNOSIS — M1990 Unspecified osteoarthritis, unspecified site: Secondary | ICD-10-CM | POA: Diagnosis not present

## 2015-04-23 DIAGNOSIS — S8261XA Displaced fracture of lateral malleolus of right fibula, initial encounter for closed fracture: Secondary | ICD-10-CM

## 2015-04-23 DIAGNOSIS — Z9842 Cataract extraction status, left eye: Secondary | ICD-10-CM | POA: Insufficient documentation

## 2015-04-23 DIAGNOSIS — Z87891 Personal history of nicotine dependence: Secondary | ICD-10-CM | POA: Diagnosis not present

## 2015-04-23 MED ORDER — OXYCODONE-ACETAMINOPHEN 5-325 MG PO TABS: 1.0000 | ORAL_TABLET | ORAL | Status: DC | PRN

## 2015-04-23 NOTE — ED Provider Notes (Signed)
CSN: [id number]     Arrival date & time 04/23/15  0946 History    Chief Complaint  Patient presents with  . Ankle Pain   Patient is a 64 y.o. male presenting with ankle pain. The history is provided by the patient. No language interpreter was used.  Ankle Pain Location:  Ankle Time since incident:  1 week Injury: yes   Mechanism of injury: fall   Fall:    Entrapped after fall: no   Ankle location:  R ankle Pain details:    Radiates to:  Does not radiate   Severity:  Mild   Onset quality:  Sudden   Duration:  1 week   Timing:  Intermittent Chronicity:  New Associated symptoms: no back pain, no fatigue and no fever    PCP: Leonides Grills, MD HPI Comments: Brandon Warren is a 64 y.o. male, with PMH noted below, who presents to the Emergency Department complaining of traumatic, sudden onset, ongoing, intermittent, 2/10 left right pain onset one week ago after pt tripped and fell, rolling his right ankle. Pt reports using ASO ankle brace, a cane and lidocaine patch which he has due to chronic back pain (does not help his back) but has been very helpful for his ankle pain.   Past Medical History  Diagnosis Date  . Hypertension   . Hypercholesterolemia   . Hypothyroidism   . MI (myocardial infarction)     in his 16s  . Arthritis    Past Surgical History  Procedure Laterality Date  . Appendectomy    . Cataract extraction Left   . Coronary angioplasty with stent placement      1 stent  . Colonoscopy N/A 02/01/2014    Procedure: COLONOSCOPY;  Surgeon: Daneil Dolin, MD;  Location: [facility];  Service: Endoscopy;  Laterality: N/A;  8:30   Family History  Problem Relation Age of Onset  . Colon cancer Neg Hx   . Colon polyps Mother    History  Substance Use Topics  . Smoking status: Former Smoker -- 2.00 packs/day for 30 years    Types: Cigarettes    Quit date: 02/02/2004  . Smokeless tobacco: Not on file  . Alcohol Use: Yes     Comment: occasional    Review of  Systems  Constitutional: Negative for fever, appetite change and fatigue.  HENT: Negative for congestion, ear discharge and sinus pressure.   Eyes: Negative for discharge.  Respiratory: Negative for cough.   Cardiovascular: Negative for chest pain.  Gastrointestinal: Negative for abdominal pain and diarrhea.  Genitourinary: Negative for frequency and hematuria.  Musculoskeletal: Negative for back pain.       Right ankle pain  Skin: Negative for rash.  Neurological: Negative for seizures and headaches.  Psychiatric/Behavioral: Negative for hallucinations.      Allergies  Lipitor and Simvastatin  Home Medications   Prior to Admission medications   Medication Sig Start Date End Date Taking? Authorizing Provider  amLODipine (NORVASC) 5 MG tablet Take 5 mg by mouth 2 (two) times daily.   Yes Historical Provider, MD  aspirin EC 81 MG tablet Take 81 mg by mouth daily.   Yes Historical Provider, MD  ezetimibe (ZETIA) 10 MG tablet Take 10 mg by mouth at bedtime.    Yes Historical Provider, MD  levothyroxine (SYNTHROID, LEVOTHROID) 200 MCG tablet Take 200 mcg by mouth daily before breakfast.   Yes Historical Provider, MD  lidocaine (LIDODERM) 5 % Place 1 patch onto the skin  daily. Remove & Discard patch within 12 hours or as directed by MD   Yes Historical Provider, MD  losartan (COZAAR) 100 MG tablet Take 100 mg by mouth daily.   Yes Historical Provider, MD  metoprolol tartrate (LOPRESSOR) 25 MG tablet Take 12.5 mg by mouth 2 (two) times daily. 06/30/13  Yes Herminio Commons, MD  tamsulosin (FLOMAX) 0.4 MG CAPS Take 0.4 mg by mouth daily.   Yes Historical Provider, MD  traZODone (DESYREL) 50 MG tablet Take 50 mg by mouth at bedtime.   Yes Historical Provider, MD  oxyCODONE-acetaminophen (PERCOCET/ROXICET) 5-325 MG per tablet Take 1 tablet by mouth every 4 (four) hours as needed. 04/23/15   Evalee Jefferson, PA-C   Triage Vitals: BP 150/98 mmHg  Pulse 77  Temp(Src) 97.8 F (36.6 C) (Oral)   Resp 18  Ht 5\' 11"  (1.803 m)  Wt 264 lb (119.75 kg)  BMI 36.84 kg/m2  SpO2 98% Physical Exam  Constitutional: He appears well-developed and well-nourished.  HENT:  Head: Normocephalic.  Cardiovascular: Normal rate and intact distal pulses.  Exam reveals no decreased pulses.   Pulses:      Dorsalis pedis pulses are 2+ on the right side, and 2+ on the left side.       Posterior tibial pulses are 2+ on the right side, and 2+ on the left side.  Musculoskeletal: He exhibits edema and tenderness.       Right ankle: He exhibits decreased range of motion and swelling. He exhibits no ecchymosis and normal pulse. Tenderness. Lateral malleolus tenderness found. No head of 5th metatarsal and no proximal fibula tenderness found. Achilles tendon normal.  Neurological: He is alert. No sensory deficit.  Skin: Skin is warm, dry and intact.  Nursing note and vitals reviewed.   ED Course  Procedures (including critical care time) DIAGNOSTIC STUDIES: Oxygen Saturation is 98% on RA, nl by my interpretation.    COORDINATION OF CARE: 8:34 PM-Discussed treatment plan which includes xray with pt at bedside and pt agreed to plan.   Labs Review Labs Reviewed - No data to display  Imaging Review No results found.   EKG Interpretation None      MDM   Final diagnoses:  Ankle sprain, right, initial encounter  Avulsion fracture of lateral malleolus of right fibula, closed, initial encounter    Patients labs and/or radiological studies were reviewed and considered during the medical decision making and disposition process.  Results were also discussed with patient. Pt was placed in a new aso, as the aso he presented was not providing him with the support he needed, he was also placed on crutches to better minimize weight bearing. RICE, referral to Dr. Aline Brochure. (states he has already contacted his office for appt).      Evalee Jefferson, PA-C 04/25/15 2037  Brandon Ferguson, MD 04/27/15 1357

## 2015-04-23 NOTE — Discharge Instructions (Signed)

## 2015-04-23 NOTE — ED Notes (Signed)
Pt reports tripped and fell x1 week ago. Pt reports left ankle pain ever since. Pt has ASO and lidocaine patch applied to site at time of arrival.

## 2015-04-24 ENCOUNTER — Ambulatory Visit: Payer: Commercial Managed Care - HMO | Admitting: Orthopedic Surgery

## 2015-04-25 DIAGNOSIS — Z6837 Body mass index (BMI) 37.0-37.9, adult: Secondary | ICD-10-CM | POA: Diagnosis not present

## 2015-04-25 DIAGNOSIS — S8261XA Displaced fracture of lateral malleolus of right fibula, initial encounter for closed fracture: Secondary | ICD-10-CM | POA: Diagnosis not present

## 2015-04-26 DIAGNOSIS — S8261XA Displaced fracture of lateral malleolus of right fibula, initial encounter for closed fracture: Secondary | ICD-10-CM | POA: Diagnosis not present

## 2015-11-07 DIAGNOSIS — N400 Enlarged prostate without lower urinary tract symptoms: Secondary | ICD-10-CM | POA: Diagnosis not present

## 2015-11-07 DIAGNOSIS — Z6839 Body mass index (BMI) 39.0-39.9, adult: Secondary | ICD-10-CM | POA: Diagnosis not present

## 2015-11-07 DIAGNOSIS — E781 Pure hyperglyceridemia: Secondary | ICD-10-CM | POA: Diagnosis not present

## 2015-11-07 DIAGNOSIS — Z0001 Encounter for general adult medical examination with abnormal findings: Secondary | ICD-10-CM | POA: Diagnosis not present

## 2015-11-07 DIAGNOSIS — M7120 Synovial cyst of popliteal space [Baker], unspecified knee: Secondary | ICD-10-CM | POA: Diagnosis not present

## 2015-11-07 DIAGNOSIS — E669 Obesity, unspecified: Secondary | ICD-10-CM | POA: Diagnosis not present

## 2015-11-07 DIAGNOSIS — I10 Essential (primary) hypertension: Secondary | ICD-10-CM | POA: Diagnosis not present

## 2015-11-07 DIAGNOSIS — Z1389 Encounter for screening for other disorder: Secondary | ICD-10-CM | POA: Diagnosis not present

## 2015-11-07 DIAGNOSIS — E063 Autoimmune thyroiditis: Secondary | ICD-10-CM | POA: Diagnosis not present

## 2015-11-07 DIAGNOSIS — Z23 Encounter for immunization: Secondary | ICD-10-CM | POA: Diagnosis not present

## 2015-12-04 ENCOUNTER — Ambulatory Visit (INDEPENDENT_AMBULATORY_CARE_PROVIDER_SITE_OTHER): Payer: Commercial Managed Care - HMO | Admitting: Urology

## 2015-12-04 ENCOUNTER — Other Ambulatory Visit: Payer: Self-pay

## 2015-12-04 VITALS — BP 145/79 | HR 70 | Ht 70.0 in | Wt 282.3 lb

## 2015-12-04 DIAGNOSIS — R972 Elevated prostate specific antigen [PSA]: Secondary | ICD-10-CM | POA: Diagnosis not present

## 2015-12-04 DIAGNOSIS — R3129 Other microscopic hematuria: Secondary | ICD-10-CM

## 2015-12-04 LAB — URINALYSIS, COMPLETE
Bilirubin, UA: NEGATIVE
Glucose, UA: NEGATIVE
Ketones, UA: NEGATIVE
Leukocytes, UA: NEGATIVE
NITRITE UA: NEGATIVE
SPEC GRAV UA: 1.02 (ref 1.005–1.030)
Urobilinogen, Ur: 0.2 mg/dL (ref 0.2–1.0)
pH, UA: 5.5 (ref 5.0–7.5)

## 2015-12-04 LAB — MICROSCOPIC EXAMINATION

## 2015-12-04 MED ORDER — CIPROFLOXACIN HCL 500 MG PO TABS: 500.0000 mg | ORAL_TABLET | Freq: Two times a day (BID) | ORAL | Status: AC

## 2015-12-04 NOTE — Progress Notes (Signed)
12/04/2015 11:08 AM   Brandon Warren 1950-11-21 [id number]  Referring provider: Elsie Lincoln, MD [address] [place], [place] [address]  Chief Complaint  Patient presents with  . Elevated PSA    HPI: Mr Brandon Warren is a 65yo seen in consultation today for elevated PSA. His PSA on referral is 6.4. No family hx of prostate cancer. He had a PSA 18 months ago which was 5.8. He was placed on cipro at that time and it came back at <4.0.  He was placed on cipro again since the PSA of 6.4.  He has nocturia 2x, frequency q2-3hrs, weak stream. He denies hematuria, dysuria, feeling of incomplete emptying.  He has issues getting and maintaining an erection. He had an MI in 52. He has 1 cardiac stent. Since then he was placed on BP meds and has had ED. He gets mildy SOB climbing a flight of stairs.  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  He has issues with decreased libido. He was placed on TRT 3 years ago for 6 months and did notice a change in libido and LUTS.      PMH: Past Medical History  Diagnosis Date  . Hypertension   . Hypercholesterolemia   . Hypothyroidism   . MI (myocardial infarction)     in his 48s  . Arthritis     Surgical History: Past Surgical History  Procedure Laterality Date  . Appendectomy    . Cataract extraction Left   . Coronary angioplasty with stent placement      1 stent  . Colonoscopy N/A 02/01/2014    Procedure: COLONOSCOPY;  Surgeon: Daneil Dolin, MD;  [facility];  Service: Endoscopy;  Laterality: N/A;  8:30    Home Medications:    Medication List       This list is accurate as of: 12/04/15 11:08 AM.  Always use your most recent med list.               ALPRAZolam 0.5 MG tablet  Commonly known as:  XANAX  0.5 mg. Reported on 12/04/2015     amLODipine 5 MG tablet  Commonly known as:  NORVASC  Take 5 mg by mouth 2 (two) times daily.     aspirin EC 81 MG tablet  Take 81 mg by mouth daily.     ciprofloxacin 500 MG  tablet  Commonly known as:  CIPRO  Reported on 12/04/2015     DHA-EPA-VITAMIN E PO  Reported on 12/04/2015     diazepam 10 MG tablet  Commonly known as:  VALIUM  Take 10 mg by mouth every 6 (six) hours as needed for anxiety.     ezetimibe 10 MG tablet  Commonly known as:  ZETIA  Take 10 mg by mouth at bedtime.     FLOMAX 0.4 MG Caps capsule  Generic drug:  tamsulosin  Take 0.4 mg by mouth daily.     furosemide 20 MG tablet  Commonly known as:  LASIX  Take 20 mg by mouth as needed.     levothyroxine 200 MCG tablet  Commonly known as:  SYNTHROID, LEVOTHROID  Take 200 mcg by mouth daily before breakfast.     losartan 100 MG tablet  Commonly known as:  COZAAR  Take 100 mg by mouth daily.     metoprolol tartrate 25 MG tablet  Commonly known as:  LOPRESSOR  Take 12.5 mg by mouth 2 (two) times daily.     traZODone  50 MG tablet  Commonly known as:  DESYREL  Take 50 mg by mouth at bedtime.        Allergies:  Allergies  Allergen Reactions  . Lipitor [Atorvastatin] Other (See Comments)    Muscle cramps  . Simvastatin Other (See Comments)    Muscle cramps     Family History: Family History  Problem Relation Age of Onset  . Colon cancer Neg Hx   . Colon polyps Mother     Social History:  reports that he quit smoking about 11 years ago. His smoking use included Cigarettes. He has a 60 pack-year smoking history. He does not have any smokeless tobacco history on file. He reports that he drinks alcohol. He reports that he does not use illicit drugs.  ROS: UROLOGY Frequent Urination?: Yes Hard to postpone urination?: Yes Burning/pain with urination?: No Get up at night to urinate?: Yes Leakage of urine?: Yes Urine stream starts and stops?: No Trouble starting stream?: No Do you have to strain to urinate?: No Blood in urine?: No Urinary tract infection?: No Sexually transmitted disease?: No Injury to kidneys or bladder?: No Painful intercourse?: No Weak stream?:  Yes Erection problems?: Yes Penile pain?: Yes  Gastrointestinal Nausea?: No Vomiting?: No Indigestion/heartburn?: No Diarrhea?: No Constipation?: No  Constitutional Fever: No Night sweats?: No Weight loss?: No Fatigue?: No  Skin Skin rash/lesions?: No Itching?: No  Eyes Blurred vision?: No Double vision?: No  Ears/Nose/Throat Sore throat?: No Sinus problems?: No  Hematologic/Lymphatic Swollen glands?: No Easy bruising?: No  Cardiovascular Leg swelling?: No Chest pain?: No  Respiratory Cough?: No Shortness of breath?: Yes  Endocrine Excessive thirst?: No  Musculoskeletal Back pain?: Yes Joint pain?: Yes  Neurological Headaches?: No Dizziness?: No  Psychologic Depression?: No Anxiety?: Yes  Physical Exam: BP 145/79 mmHg  Pulse 70  Ht 5\' 10"  (1.778 m)  Wt 128.05 kg (282 lb 4.8 oz)  BMI 40.51 kg/m2  Constitutional:  Alert and oriented, No acute distress. HEENT: [place] AT, moist mucus membranes.  Trachea midline, no masses. Cardiovascular: No clubbing, cyanosis, or edema. Respiratory: Normal respiratory effort, no increased work of breathing. GI: Abdomen is soft, nontender, nondistended, no abdominal masses GU: No CVA tenderness. Uncircumcised phallus. No masses/lesions. 50g prostate firm bilaterally.  Skin: No rashes, bruises or suspicious lesions. Lymph: No cervical or inguinal adenopathy. Neurologic: Grossly intact, no focal deficits, moving all 4 extremities. Psychiatric: Normal mood and affect.  Laboratory Data: No results found for: WBC, HGB, HCT, MCV, PLT  No results found for: CREATININE  No results found for: PSA  No results found for: TESTOSTERONE  No results found for: HGBA1C  Urinalysis No results found for: COLORURINE, APPEARANCEUR, LABSPEC, PHURINE, GLUCOSEU, HGBUR, BILIRUBINUR, KETONESUR, PROTEINUR, UROBILINOGEN, NITRITE, LEUKOCYTESUR  Pertinent Imaging: none  Assessment & Plan:   1. Microscopic hematuria - UA normal  today. Will recheck in 3-6 months - Urinalysis, Complete  2. Elevated PSA -schedule for prostate biopsy  3. ED -pt defers treatment at this time  No Follow-up on file.  Cleon Gustin, [facility] [address] [place], [place] [address] [phone]

## 2015-12-18 ENCOUNTER — Ambulatory Visit (INDEPENDENT_AMBULATORY_CARE_PROVIDER_SITE_OTHER): Payer: Medicare HMO | Admitting: Urology

## 2015-12-18 ENCOUNTER — Other Ambulatory Visit: Payer: Self-pay | Admitting: Urology

## 2015-12-18 VITALS — BP 127/80 | HR 71 | Ht 70.0 in | Wt 283.0 lb

## 2015-12-18 DIAGNOSIS — R972 Elevated prostate specific antigen [PSA]: Secondary | ICD-10-CM | POA: Diagnosis not present

## 2015-12-18 MED ORDER — GENTAMICIN SULFATE 40 MG/ML IJ SOLN
80.0000 mg | Freq: Once | INTRAMUSCULAR | Status: AC
  Administered 2015-12-18: 80 mg via INTRAMUSCULAR

## 2015-12-18 NOTE — Progress Notes (Signed)
Prostate Biopsy Procedure   Informed consent was obtained after discussing risks/benefits of the procedure.  A time out was performed to ensure correct patient identity.  Pre-Procedure: - Last PSA Level: 6.4 - Gentamicin given prophylactically - Levaquin 500 mg administered PO -Transrectal Ultrasound performed revealing a 60.7 gm prostate -No significant hypoechoic or median lobe noted  Procedure: - Prostate block performed using 10 cc 1% lidocaine and biopsies taken from sextant areas, a total of 12 under ultrasound guidance.  Post-Procedure: - Patient tolerated the procedure well - He was counseled to seek immediate medical attention if experiences any severe pain, significant bleeding, or fevers - Return in one week to discuss biopsy results

## 2015-12-20 LAB — PATHOLOGY REPORT

## 2015-12-24 ENCOUNTER — Other Ambulatory Visit: Payer: Self-pay | Admitting: Urology

## 2015-12-25 ENCOUNTER — Ambulatory Visit: Payer: Commercial Managed Care - HMO

## 2016-04-20 ENCOUNTER — Other Ambulatory Visit: Payer: Self-pay

## 2016-04-20 ENCOUNTER — Emergency Department (HOSPITAL_COMMUNITY): Payer: Commercial Managed Care - HMO

## 2016-04-20 ENCOUNTER — Observation Stay (HOSPITAL_COMMUNITY)
Admission: EM | Admit: 2016-04-20 | Discharge: 2016-04-21 | Disposition: A | Discharge status: Home / Self Care | Payer: Commercial Managed Care - HMO | Attending: Family Medicine | Admitting: Family Medicine

## 2016-04-20 ENCOUNTER — Encounter (HOSPITAL_COMMUNITY): Payer: Self-pay | Admitting: Emergency Medicine

## 2016-04-20 DIAGNOSIS — R0789 Other chest pain: Principal | ICD-10-CM | POA: Insufficient documentation

## 2016-04-20 DIAGNOSIS — R079 Chest pain, unspecified: Secondary | ICD-10-CM | POA: Diagnosis present

## 2016-04-20 DIAGNOSIS — I252 Old myocardial infarction: Secondary | ICD-10-CM | POA: Diagnosis not present

## 2016-04-20 DIAGNOSIS — Z87891 Personal history of nicotine dependence: Secondary | ICD-10-CM | POA: Diagnosis not present

## 2016-04-20 DIAGNOSIS — R002 Palpitations: Secondary | ICD-10-CM | POA: Diagnosis present

## 2016-04-20 DIAGNOSIS — I2510 Atherosclerotic heart disease of native coronary artery without angina pectoris: Secondary | ICD-10-CM

## 2016-04-20 DIAGNOSIS — R0602 Shortness of breath: Secondary | ICD-10-CM | POA: Diagnosis not present

## 2016-04-20 DIAGNOSIS — E038 Other specified hypothyroidism: Secondary | ICD-10-CM

## 2016-04-20 DIAGNOSIS — Z7982 Long term (current) use of aspirin: Secondary | ICD-10-CM | POA: Insufficient documentation

## 2016-04-20 DIAGNOSIS — E785 Hyperlipidemia, unspecified: Secondary | ICD-10-CM | POA: Diagnosis present

## 2016-04-20 DIAGNOSIS — E781 Pure hyperglyceridemia: Secondary | ICD-10-CM | POA: Diagnosis present

## 2016-04-20 DIAGNOSIS — Z79899 Other long term (current) drug therapy: Secondary | ICD-10-CM | POA: Insufficient documentation

## 2016-04-20 DIAGNOSIS — I10 Essential (primary) hypertension: Secondary | ICD-10-CM | POA: Insufficient documentation

## 2016-04-20 DIAGNOSIS — F419 Anxiety disorder, unspecified: Secondary | ICD-10-CM

## 2016-04-20 DIAGNOSIS — I25119 Atherosclerotic heart disease of native coronary artery with unspecified angina pectoris: Secondary | ICD-10-CM | POA: Diagnosis present

## 2016-04-20 DIAGNOSIS — E039 Hypothyroidism, unspecified: Secondary | ICD-10-CM | POA: Diagnosis present

## 2016-04-20 LAB — I-STAT TROPONIN, ED: Troponin i, poc: 0.01 ng/mL (ref 0.00–0.08)

## 2016-04-20 LAB — BASIC METABOLIC PANEL
Anion gap: 9 (ref 5–15)
BUN: 15 mg/dL (ref 6–20)
CALCIUM: 9.9 mg/dL (ref 8.9–10.3)
CHLORIDE: 103 mmol/L (ref 101–111)
CO2: 27 mmol/L (ref 22–32)
CREATININE: 1.21 mg/dL (ref 0.61–1.24)
GFR calc Af Amer: >60 mL/min (ref >60)
Glucose, Bld: 126 mg/dL — ABNORMAL HIGH (ref 65–99)
Potassium: 4.2 mmol/L (ref 3.5–5.1)
SODIUM: 139 mmol/L (ref 135–145)

## 2016-04-20 LAB — CBC
HCT: 44.8 % (ref 39.0–52.0)
Hemoglobin: 15 g/dL (ref 13.0–17.0)
MCH: 28.2 pg (ref 26.0–34.0)
MCHC: 33.5 g/dL (ref 30.0–36.0)
MCV: 84.4 fL (ref 78.0–100.0)
PLATELETS: 248 10*3/uL (ref 150–400)
RBC: 5.31 MIL/uL (ref 4.22–5.81)
RDW: 13.1 % (ref 11.5–15.5)
WBC: 11.2 10*3/uL — AB (ref 4.0–10.5)

## 2016-04-20 LAB — D-DIMER, QUANTITATIVE: D-Dimer, Quant: <0.27 ug/mL-FEU (ref 0.00–0.50)

## 2016-04-20 LAB — TROPONIN I: TROPONIN I: 0.05 ng/mL — AB (ref <0.031)

## 2016-04-20 LAB — BRAIN NATRIURETIC PEPTIDE: B NATRIURETIC PEPTIDE 5: 33.7 pg/mL (ref 0.0–100.0)

## 2016-04-20 MED ORDER — ACETAMINOPHEN 325 MG PO TABS
650.0000 mg | ORAL_TABLET | ORAL | Status: DC | PRN
  Administered 2016-04-20 – 2016-04-21 (×2): 650 mg via ORAL
  Filled 2016-04-20 (×2): qty 2

## 2016-04-20 MED ORDER — LEVOTHYROXINE SODIUM 200 MCG PO TABS
200.0000 ug | ORAL_TABLET | Freq: Every day | ORAL | Status: DC
  Administered 2016-04-21: 200 ug via ORAL
  Filled 2016-04-20: qty 1

## 2016-04-20 MED ORDER — TAMSULOSIN HCL 0.4 MG PO CAPS
0.4000 mg | ORAL_CAPSULE | Freq: Every evening | ORAL | Status: DC
  Administered 2016-04-20: 0.4 mg via ORAL
  Filled 2016-04-20: qty 1

## 2016-04-20 MED ORDER — ASPIRIN EC 81 MG PO TBEC
81.0000 mg | DELAYED_RELEASE_TABLET | Freq: Every evening | ORAL | Status: DC
Start: 2016-04-21 — End: 2016-04-21

## 2016-04-20 MED ORDER — ASPIRIN 81 MG PO CHEW
324.0000 mg | CHEWABLE_TABLET | Freq: Once | ORAL | Status: AC
  Administered 2016-04-20: 324 mg via ORAL
  Filled 2016-04-20: qty 4

## 2016-04-20 MED ORDER — ONDANSETRON HCL 4 MG/2ML IJ SOLN: 4.0000 mg | Freq: Four times a day (QID) | INTRAMUSCULAR | Status: DC | PRN

## 2016-04-20 MED ORDER — AMLODIPINE BESYLATE 5 MG PO TABS
5.0000 mg | ORAL_TABLET | Freq: Two times a day (BID) | ORAL | Status: DC
  Administered 2016-04-20 – 2016-04-21 (×2): 5 mg via ORAL
  Filled 2016-04-20 (×2): qty 1

## 2016-04-20 MED ORDER — SODIUM CHLORIDE 0.9% FLUSH
3.0000 mL | Freq: Two times a day (BID) | INTRAVENOUS | Status: DC
  Administered 2016-04-20: 3 mL via INTRAVENOUS

## 2016-04-20 MED ORDER — SODIUM CHLORIDE 0.9 % IV SOLN: 250.0000 mL | INTRAVENOUS | Status: DC | PRN

## 2016-04-20 MED ORDER — EZETIMIBE 10 MG PO TABS
10.0000 mg | ORAL_TABLET | Freq: Every day | ORAL | Status: DC
  Administered 2016-04-20: 10 mg via ORAL
  Filled 2016-04-20: qty 1

## 2016-04-20 MED ORDER — SODIUM CHLORIDE 0.9% FLUSH: 3.0000 mL | INTRAVENOUS | Status: DC | PRN

## 2016-04-20 MED ORDER — LOSARTAN POTASSIUM 50 MG PO TABS
100.0000 mg | ORAL_TABLET | Freq: Every evening | ORAL | Status: DC
  Administered 2016-04-20: 100 mg via ORAL
  Filled 2016-04-20: qty 2

## 2016-04-20 MED ORDER — ALPRAZOLAM 0.25 MG PO TABS
0.5000 mg | ORAL_TABLET | Freq: Three times a day (TID) | ORAL | Status: DC | PRN
  Administered 2016-04-21: 0.5 mg via ORAL
  Filled 2016-04-20: qty 2

## 2016-04-20 MED ORDER — NITROGLYCERIN 0.4 MG SL SUBL: 0.4000 mg | SUBLINGUAL_TABLET | SUBLINGUAL | Status: DC | PRN

## 2016-04-20 MED ORDER — ALPRAZOLAM 0.25 MG PO TABS
0.5000 mg | ORAL_TABLET | Freq: Once | ORAL | Status: AC
  Administered 2016-04-20: 0.5 mg via ORAL
  Filled 2016-04-20: qty 2

## 2016-04-20 MED ORDER — METOPROLOL TARTRATE 25 MG PO TABS
12.5000 mg | ORAL_TABLET | Freq: Two times a day (BID) | ORAL | Status: DC
  Administered 2016-04-20 – 2016-04-21 (×2): 12.5 mg via ORAL
  Filled 2016-04-20 (×3): qty 1

## 2016-04-20 MED ORDER — ENOXAPARIN SODIUM 40 MG/0.4ML ~~LOC~~ SOLN
40.0000 mg | SUBCUTANEOUS | Status: DC
  Administered 2016-04-20: 40 mg via SUBCUTANEOUS
  Filled 2016-04-20: qty 0.4

## 2016-04-20 MED ORDER — ZOLPIDEM TARTRATE 5 MG PO TABS
5.0000 mg | ORAL_TABLET | Freq: Every evening | ORAL | Status: DC | PRN
  Administered 2016-04-20: 5 mg via ORAL
  Filled 2016-04-20: qty 1

## 2016-04-20 NOTE — ED Notes (Signed)
This RN attempted to move patient to a room, pt stating that he really would like to leave and does not want to go into a room, pt stating that he needs to get upstairs to see his son, pt made aware that the physician has more tests ordered for him, pt stating that he would really like to leave. This RN advised she would let MD know.  Dr. Wyvonnia Dusky made aware and advised this RN he would be over to speak with patient soon.

## 2016-04-20 NOTE — H&P (Signed)
History and Physical    Brandon Warren [id number] DOB: 02-Mar-1951 DOA: 04/20/2016  PCP: Leonides Grills, MD   Patient coming from: Home   Chief Complaint: Chest pain   HPI: Brandon Warren is a 65 y.o. male with medical history significant for hypertension, hyperlipidemia, hypothyroidism, and CAD with stent placed to the obtuse marginal were than 20 years ago now presenting to the ED with acute onset of palpitations and left-sided chest discomfort with lightheadedness, nausea, dyspnea, and diaphoresis. Patient reports being in his usual state of health until receiving news that his son was in the hospital and going to the OR with a self-inflicted gunshot wound to the head. Patient had come to the hospital to see him but developed sudden onset palpitations with a crampy discomfort in his left chest and associated lightheadedness, nausea, dyspnea, and diaphoresis. All of these symptoms resolved spontaneously after approximately 10 minutes. Patient was brought into the ED for further evaluation.   ED Course: Upon arrival to the ED, patient is found to be afebrile, saturating well on room air, mildly tachycardic in the low 100s, and with vitals otherwise stable. EKG demonstrates sinus tachycardia with rate 108 and incomplete right bundle branch block with nonspecific ST abnormality. Chest x-ray is negative for acute cardiopulmonary disease. BMP is unremarkable, CBC features a leukocytosis to 11,200, d-dimer is negative and initial troponin is undetectable. Patient was given a full dose aspirin chew in the emergency department and 0.5 mg oral Xanax. Patient remained asymptomatic in the ED and hemodynamically stable. He will be observed on the telemetry unit for ongoing evaluation and management of the aforementioned symptoms in a patient with prior MI and concern for possible acute coronary syndrome.  Review of Systems:  All other systems reviewed and apart from HPI, are negative.  Past Medical  History  Diagnosis Date  . Hypertension   . Hypercholesterolemia   . Hypothyroidism   . MI (myocardial infarction) ([place])     in his 21s  . Arthritis     Past Surgical History  Procedure Laterality Date  . Appendectomy    . Cataract extraction Left   . Coronary angioplasty with stent placement      1 stent  . Colonoscopy N/A 02/01/2014    Procedure: COLONOSCOPY;  Surgeon: Daneil Dolin, MD;  Location: [facility];  Service: Endoscopy;  Laterality: N/A;  8:30     reports that he quit smoking about 12 years ago. His smoking use included Cigarettes. He has a 60 pack-year smoking history. He does not have any smokeless tobacco history on file. He reports that he drinks alcohol. He reports that he does not use illicit drugs.  Allergies  Allergen Reactions  . Meat Extract Diarrhea, Nausea Only and Other (See Comments)    alphagal reaction  . Lipitor [Atorvastatin] Other (See Comments)    Muscle cramps  . Simvastatin Other (See Comments)    Muscle cramps     Family History  Problem Relation Age of Onset  . Colon cancer Neg Hx   . Colon polyps Mother      Prior to Admission medications   Medication Sig Start Date End Date Taking? Authorizing Provider  ALPRAZolam Duanne Moron) 0.5 MG tablet Take 0.5 mg by mouth 3 (three) times daily as needed for anxiety or sleep. Reported on 12/04/2015   Yes Historical Provider, MD  amLODipine (NORVASC) 5 MG tablet Take 5 mg by mouth 2 (two) times daily.   Yes Historical Provider, MD  aspirin  EC 81 MG tablet Take 81 mg by mouth every evening. Reported on 12/18/2015   Yes Historical Provider, MD  diazepam (VALIUM) 10 MG tablet Take 10 mg by mouth every 6 (six) hours as needed for anxiety.   Yes Historical Provider, MD  ezetimibe (ZETIA) 10 MG tablet Take 10 mg by mouth at bedtime.    Yes Historical Provider, MD  furosemide (LASIX) 20 MG tablet Take 20 mg by mouth as needed for fluid or edema.    Yes Historical Provider, MD  levothyroxine (SYNTHROID,  LEVOTHROID) 200 MCG tablet Take 200 mcg by mouth daily before breakfast.   Yes Historical Provider, MD  losartan (COZAAR) 100 MG tablet Take 100 mg by mouth every evening.    Yes Historical Provider, MD  metoprolol tartrate (LOPRESSOR) 25 MG tablet Take 12.5 mg by mouth 2 (two) times daily. 06/30/13  Yes Herminio Commons, MD  tamsulosin (FLOMAX) 0.4 MG CAPS Take 0.4 mg by mouth every evening.    Yes Historical Provider, MD    Physical Exam: Filed Vitals:   04/20/16 1831 04/20/16 1836 04/20/16 1859 04/20/16 1930  BP: 124/88   135/74  Pulse: 95 100 100 96  Temp:      TempSrc:      Resp:  19 19 17   SpO2:  94% 93% 93%      Constitutional: NAD, calm, comfortable, obese  Eyes: PERTLA, lids and conjunctivae normal ENMT: Mucous membranes are moist. Posterior pharynx clear of any exudate or lesions.   Neck: normal, supple, no masses, no thyromegaly Respiratory: clear to auscultation bilaterally, no wheezing, no crackles. Normal respiratory effort.   Cardiovascular: S1 & S2 heard, regular rate and rhythm, no significant murmurs / rubs / gallops. Trace pedal edema b/l. No significant JVD. Abdomen: No distension, no tenderness, no masses palpated. Bowel sounds normal.  Musculoskeletal: no clubbing / cyanosis. No joint deformity upper and lower extremities. Normal muscle tone.  Skin: no significant rashes, lesions, ulcers. Warm, dry, well-perfused. Neurologic: CN 2-12 grossly intact. Sensation intact, DTR normal. Strength 5/5 in all 4 limbs.  Psychiatric: Normal judgment and insight. Alert and oriented x 3. Normal mood and affect.     Labs on Admission: I have personally reviewed following labs and imaging studies  CBC:  Recent Labs Lab 04/20/16 1626  WBC 11.2*  HGB 15.0  HCT 44.8  MCV 84.4  PLT [id number]   Basic Metabolic Panel:  Recent Labs Lab 04/20/16 1626  NA 139  K 4.2  CL 103  CO2 27  GLUCOSE 126*  BUN 15  CREATININE 1.21  CALCIUM 9.9   GFR: CrCl cannot be calculated  (Unknown ideal weight.). Liver Function Tests: No results for input(s): AST, ALT, ALKPHOS, BILITOT, PROT, ALBUMIN in the last 168 hours. No results for input(s): LIPASE, AMYLASE in the last 168 hours. No results for input(s): AMMONIA in the last 168 hours. Coagulation Profile: No results for input(s): INR, PROTIME in the last 168 hours. Cardiac Enzymes:  Recent Labs Lab 04/20/16 1626  TROPONINI <0.03   BNP (last 3 results) No results for input(s): PROBNP in the last 8760 hours. HbA1C: No results for input(s): HGBA1C in the last 72 hours. CBG: No results for input(s): GLUCAP in the last 168 hours. Lipid Profile: No results for input(s): CHOL, HDL, LDLCALC, TRIG, CHOLHDL, LDLDIRECT in the last 72 hours. Thyroid Function Tests: No results for input(s): TSH, T4TOTAL, FREET4, T3FREE, THYROIDAB in the last 72 hours. Anemia Panel: No results for input(s): VITAMINB12, FOLATE, FERRITIN, TIBC,  IRON, RETICCTPCT in the last 72 hours. Urine analysis:    Component Value Date/Time   APPEARANCEUR Clear 12/04/2015 1038   GLUCOSEU Negative 12/04/2015 1038   BILIRUBINUR Negative 12/04/2015 1038   PROTEINUR 3+* 12/04/2015 1038   NITRITE Negative 12/04/2015 1038   LEUKOCYTESUR Negative 12/04/2015 1038   Sepsis Labs: @LABRCNTIP (procalcitonin:4,lacticidven:4) )No results found for this or any previous visit (from the past 240 hour(s)).   Radiological Exams on Admission: Dg Chest 2 View  04/20/2016  CLINICAL DATA:  Palpitations, left-sided chest pain onset approximately 30 minutes ago. EXAM: CHEST  2 VIEW COMPARISON:  Chest x-ray dated 01/06/2006. FINDINGS: Heart size is normal. Cardiomediastinal silhouette is normal in size and configuration. Lungs are clear. No evidence of pneumonia or pleural effusion. Osseous structures about the chest are unremarkable. IMPRESSION: No active disease. Electronically Signed   By: Franki Cabot M.D.   On: 04/20/2016 17:16    EKG: Independently reviewed. Sinus  tachycardia (rate 108), incomplete RBBB, non-specific ST abnormality   Assessment/Plan  1. Chest pain  - Occurred in the setting of exeptional psychosocial stress  - Resolved spontaneously within ~10 min - Initial EKG with incomplete RBBB and non-specific ST changes  - Initial troponin <0.03 - ASA 324 mg chew given in ED, beta-blocker ordered, statin precluded by prior intolerance  - Observe on telemetry for ischemic changes, obtain serial troponin measurements, repeat EKG in am (sooner for CP recurrence)  - Continue daily ASA 81, beta-blocker, Zetia    2. CAD - Reports hx of MI in 78's with DES to OM  - Has statin intolerance  - Continue current management with ASA 81, Lopressor, losartan, Zetia    3. Hypertension  - Mildly elevated in ED  - Continue current management with Norvasc, losartan, Lopressor   4. Hyperlipidemia  - Continue current management with Zetia    5. Hypothyroidism  - Appears to be stable; will check thyroid studies given the presentation  - Continue current-dose Synthroid for now   6. Anxiety  - Pt has chronic anxiety disorder managed with prn Xanax  - Has acute anxiety related to son currently in hospital with self-inflicted GSW  - Continue current management with prn Xanax - Consider chaplain or SW consultation prn    DVT prophylaxis: sq Lovenox Code Status: Full  Family Communication: Discussed with patient  Disposition Plan: Observe on telemetry   Consults called: None  Admission status: Observation    Vianne Bulls, MD [facility] Pager [phone]  If 7PM-7AM, please contact night-coverage [email or web address] Password [email or web address]  04/20/2016, 8:11 PM
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
## 2016-04-21 ENCOUNTER — Other Ambulatory Visit: Payer: Self-pay

## 2016-04-21 DIAGNOSIS — E039 Hypothyroidism, unspecified: Secondary | ICD-10-CM

## 2016-04-21 DIAGNOSIS — Z7982 Long term (current) use of aspirin: Secondary | ICD-10-CM | POA: Diagnosis not present

## 2016-04-21 DIAGNOSIS — E785 Hyperlipidemia, unspecified: Secondary | ICD-10-CM

## 2016-04-21 DIAGNOSIS — R079 Chest pain, unspecified: Secondary | ICD-10-CM | POA: Diagnosis not present

## 2016-04-21 DIAGNOSIS — Z87891 Personal history of nicotine dependence: Secondary | ICD-10-CM | POA: Diagnosis not present

## 2016-04-21 DIAGNOSIS — I10 Essential (primary) hypertension: Secondary | ICD-10-CM

## 2016-04-21 DIAGNOSIS — R0789 Other chest pain: Secondary | ICD-10-CM | POA: Diagnosis not present

## 2016-04-21 DIAGNOSIS — Z79899 Other long term (current) drug therapy: Secondary | ICD-10-CM | POA: Diagnosis not present

## 2016-04-21 DIAGNOSIS — R0602 Shortness of breath: Secondary | ICD-10-CM | POA: Diagnosis not present

## 2016-04-21 DIAGNOSIS — I252 Old myocardial infarction: Secondary | ICD-10-CM | POA: Diagnosis not present

## 2016-04-21 LAB — T4, FREE: Free T4: 0.97 ng/dL (ref 0.61–1.12)

## 2016-04-21 LAB — TSH: TSH: 3.6 u[IU]/mL (ref 0.350–4.500)

## 2016-04-21 LAB — TROPONIN I: TROPONIN I: 0.03 ng/mL (ref <0.031)
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
## 2016-04-21 NOTE — Care Management Obs Status (Signed)
[facility] NOTIFICATION   Patient Details  Name: Brandon Warren MRN: [id number] Date of Birth: 06-Aug-1951   Medicare Observation Status Notification Given:  Yes    Bethena Roys, RN 04/21/2016, 10:30 AM

## 2016-04-21 NOTE — Discharge Instructions (Signed)
Chest Pain Observation °It is often hard to give a specific diagnosis for the cause of chest pain. Among other possibilities your symptoms might be caused by inadequate oxygen delivery to your heart (angina). Angina that is not treated or evaluated can lead to a heart attack (myocardial infarction) or death. °Blood tests, electrocardiograms, and X-rays may have been done to help determine a possible cause of your chest pain. After evaluation and observation, your health care provider has determined that it is unlikely your pain was caused by an unstable condition that requires hospitalization. However, a full evaluation of your pain may need to be completed, with additional diagnostic testing as directed. It is very important to keep your follow-up appointments. Not keeping your follow-up appointments could result in permanent heart damage, disability, or death. If there is any problem keeping your follow-up appointments, you must call your health care provider. °HOME CARE INSTRUCTIONS  °Due to the slight chance that your pain could be angina, it is important to follow your health care provider's treatment plan and also maintain a healthy lifestyle: °· Maintain or work toward achieving a healthy weight. °· Stay physically active and exercise regularly. °· Decrease your salt intake. °· Eat a balanced, healthy diet. Talk to a dietitian to learn about heart-healthy foods. °· Increase your fiber intake by including whole grains, vegetables, fruits, and nuts in your diet. °· Avoid situations that cause stress, anger, or depression. °· Take medicines as advised by your health care provider. Report any side effects to your health care provider. Do not stop medicines or adjust the dosages on your own. °· Quit smoking. Do not use nicotine patches or gum until you check with your health care provider. °· Keep your blood pressure, blood sugar, and cholesterol levels within normal limits. °· Limit alcohol intake to no more than  1 drink per day for women who are not pregnant and 2 drinks per day for men. °· Do not abuse drugs. °SEEK IMMEDIATE MEDICAL CARE IF: °You have severe chest pain or pressure which may include symptoms such as: °· You feel pain or pressure in your arms, neck, jaw, or back. °· You have severe back or abdominal pain, feel sick to your stomach (nauseous), or throw up (vomit). °· You are sweating profusely. °· You are having a fast or irregular heartbeat. °· You feel short of breath while at rest. °· You notice increasing shortness of breath during rest, sleep, or with activity. °· You have chest pain that does not get better after rest or after taking your usual medicine. °· You wake from sleep with chest pain. °· You are unable to sleep because you cannot breathe. °· You develop a frequent cough or you are coughing up blood. °· You feel dizzy, faint, or experience extreme fatigue. °· You develop severe weakness, dizziness, fainting, or chills. °Any of these symptoms may represent a serious problem that is an emergency. Do not wait to see if the symptoms will go away. Call your local emergency services (911 in the [place] Do not drive yourself to the hospital. °MAKE SURE YOU: °· Understand these instructions. °· Will watch your condition. °· Will get help right away if you are not doing well or get worse. °  °This information is not intended to replace advice given to you by your health care provider. Make sure you discuss any questions you have with your health care provider. °  °Document Released: 11/15/2010 Document Revised: 10/18/2013 Document Reviewed: 04/14/2013 °Elsevier Interactive Patient   Education 2016 Elsevier Inc.  Chest Wall Pain Chest wall pain is pain in or around the bones and muscles of your chest. Sometimes, an injury causes this pain. Sometimes, the cause may not be known. This pain may take several weeks or longer to get better. HOME CARE Pay attention to any changes in your symptoms. Take these  actions to help with your pain:  Rest as told by your doctor.  Avoid activities that cause pain. Try not to use your chest, belly (abdominal), or side muscles to lift heavy things.  If directed, apply ice to the painful area:  Put ice in a plastic bag.  Place a towel between your skin and the bag.  Leave the ice on for 20 minutes, 2-3 times per day.  Take over-the-counter and prescription medicines only as told by your doctor.  Do not use tobacco products, including cigarettes, chewing tobacco, and e-cigarettes. If you need help quitting, ask your doctor.  Keep all follow-up visits as told by your doctor. This is important. GET HELP IF:  You have a fever.  Your chest pain gets worse.  You have new symptoms. GET HELP RIGHT AWAY IF:  You feel sick to your stomach (nauseous) or you throw up (vomit).  You feel sweaty or light-headed.  You have a cough with phlegm (sputum) or you cough up blood.  You are short of breath.   This information is not intended to replace advice given to you by your health care provider. Make sure you discuss any questions you have with your health care provider.   Document Released: 03/31/2008 Document Revised: 07/04/2015 Document Reviewed: 01/08/2015 Elsevier Interactive Patient Education 2016 Elsevier Inc.   Nonspecific Chest Pain It is often hard to find the cause of chest pain. There is always a chance that your pain could be related to something serious, such as a heart attack or a blood clot in your lungs. Chest pain can also be caused by conditions that are not life-threatening. If you have chest pain, it is very important to follow up with your doctor.  HOME CARE  If you were prescribed an antibiotic medicine, finish it all even if you start to feel better.  Avoid any activities that cause chest pain.  Do not use any tobacco products, including cigarettes, chewing tobacco, or electronic cigarettes. If you need help quitting, ask your  doctor.  Do not drink alcohol.  Take medicines only as told by your doctor.  Keep all follow-up visits as told by your doctor. This is important. This includes any further testing if your chest pain does not go away.  Your doctor may tell you to keep your head raised (elevated) while you sleep.  Make lifestyle changes as told by your doctor. These may include:  Getting regular exercise. Ask your doctor to suggest some activities that are safe for you.  Eating a heart-healthy diet. Your doctor or a diet specialist (dietitian) can help you to learn healthy eating options.  Maintaining a healthy weight.  Managing diabetes, if necessary.  Reducing stress. GET HELP IF:  Your chest pain does not go away, even after treatment.  You have a rash with blisters on your chest.  You have a fever. GET HELP RIGHT AWAY IF:  Your chest pain is worse.  You have an increasing cough, or you cough up blood.  You have severe belly (abdominal) pain.  You feel extremely weak.  You pass out (faint).  You have chills.  You have  sudden, unexplained chest discomfort.  You have sudden, unexplained discomfort in your arms, back, neck, or jaw.  You have shortness of breath at any time.  You suddenly start to sweat, or your skin gets clammy.  You feel nauseous.  You vomit.  You suddenly feel light-headed or dizzy.  Your heart begins to beat quickly, or it feels like it is skipping beats. These symptoms may be an emergency. Do not wait to see if the symptoms will go away. Get medical help right away. Call your local emergency services (911 in the [place] Do not drive yourself to the hospital.   This information is not intended to replace advice given to you by your health care provider. Make sure you discuss any questions you have with your health care provider.   Document Released: 03/31/2008 Document Revised: 11/03/2014 Document Reviewed: 05/19/2014 Elsevier Interactive Patient  Education [employer].

## 2016-04-21 NOTE — Discharge Summary (Signed)
Physician Discharge Summary  Brandon Warren [id number] DOB: December 01, 1950 DOA: 04/20/2016  PCP: Leonides Grills, MD  Admit date: 04/20/2016 Discharge date: 04/21/2016  Admitted From: Home Disposition:  Home  Recommendations for Outpatient Follow-up:  1. Follow up with PCP in 1-2 weeks  Discharge Condition: Stable CODE STATUS: FULL Diet recommendation: Heart Healthy  Brief/Interim Summary: Brandon Warren is a 65 y.o. male with a history of CAD s/p DES to OM, HTN, HL and hypothyroidism who consulted for chest pain.   History of coronary artery disease with  previous stenting of the obtuse marginal branch in 2005 with a Taxus DES in setting of MI, which occurred when he was 65 yrs old. He had a followup catheterization in 2006 as part of a research trial and had  patent stents. At the time of his initial evaluation he had tingling in  his arms and some chest pain as his anginal equivalent.   Last seen by Dr. Bronson Ing 05/20/13. At that time he was doing great on cardiac stand point without any chest pain and his beta blocker reduced due to bradycardia.  He was in usual state of health up until yesterday when he received the news that his son is in hospital and going to OR with a self inflected gunshot to his head. He drove from [facility] to [facility] in relatively short period of time. He rushed into the hospital and was outside the waiting area when he has started to having shortness of breath, diaphoresis, lightheadedness and palpitations with some crampy achy pain on his left sided chest. He told one of the nurse to check his blood pressure which was elevated and taken to the ER for further evaluation. EKG shows sinus tachycardia at rate of 108 bpm, incomplete RBBB, nonspecific ST abnormality. Point of care troponin was negative. He was admitted to hospitalist service for observation. All episode lasted less than 20 minutes. This pain is very different from his  prior cardiac pain when he had a stent placement. 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 Denies fever, chills, shortness of breath, orthopnea, PND, syncope, lower extremity edema.  Troponin 0.03--> 0.05-->0.03. Chest x-ray clear. D-dimer negative. TSH normal. BNP normal. Lytes WML. Chest pain-free since admission. From cardiology team:   1. Chest pain: - Atypical in setting of psychosocial stressor. This pain is different from prior cardiac pain. EKG with nonspecific ST changes. Troponin negative. He is chest pain-free since admission. No inpatient cardiac evaluation needed. He has ruled out. Follow-up with primary care in 2 weeks ( Already has appointment). Follow-up with cardiology as needed.  2. CAD with previous stenting of the OM:  - Asymptomatic, and getting regular physical activity at work. Continue ASA, beta blocker, ACE and statin.  3. HTN:  - Initially elevated now stable. Continue current management.   4. Hypercholesterolemia:  - He is intolerant of statins (muscle cramps), and takes Zetia. - Managed by primary care provider. Pt advised to follow up in 2 weeks.    Discharge Diagnoses:  Principal Problem:   Chest pain Active Problems:   CAD (coronary artery disease) of artery bypass graft   Hypertension, benign   Hyperlipidemia   Hypothyroidism  Discharge Instructions     Discharge Instructions    Diet - low sodium heart healthy    Complete by:  As directed      Increase activity slowly    Complete by:  As  directed             Medication List    TAKE these medications        ALPRAZolam 0.5 MG tablet  Commonly known as:  XANAX  Take 0.5 mg by mouth 3 (three) times daily as needed for anxiety or sleep. Reported on 12/04/2015     amLODipine 5 MG tablet  Commonly known as:  NORVASC  Take 5 mg by mouth 2 (two) times daily.     aspirin EC 81 MG tablet  Take 81 mg by mouth every evening. Reported on 12/18/2015      diazepam 10 MG tablet  Commonly known as:  VALIUM  Take 10 mg by mouth every 6 (six) hours as needed for anxiety.     ezetimibe 10 MG tablet  Commonly known as:  ZETIA  Take 10 mg by mouth at bedtime.     FLOMAX 0.4 MG Caps capsule  Generic drug:  tamsulosin  Take 0.4 mg by mouth every evening.     furosemide 20 MG tablet  Commonly known as:  LASIX  Take 20 mg by mouth as needed for fluid or edema.     levothyroxine 200 MCG tablet  Commonly known as:  SYNTHROID, LEVOTHROID  Take 200 mcg by mouth daily before breakfast.     losartan 100 MG tablet  Commonly known as:  COZAAR  Take 100 mg by mouth every evening.     metoprolol tartrate 25 MG tablet  Commonly known as:  LOPRESSOR  Take 12.5 mg by mouth 2 (two) times daily.       Follow-up Information    Follow up with Leonides Grills, MD In 2 weeks.   Specialty:  Family Medicine   Why:  Hospital Follow Up   Contact information:   [place] [id number] [phone]      Allergies  Allergen Reactions  . Meat Extract Diarrhea, Nausea Only and Other (See Comments)    alphagal reaction  . Lipitor [Atorvastatin] Other (See Comments)    Muscle cramps  . Simvastatin Other (See Comments)    Muscle cramps    Consultations:  cardiology  Procedures/Studies: Dg Chest 2 View  04/20/2016  CLINICAL DATA:  Palpitations, left-sided chest pain onset approximately 30 minutes ago. EXAM: CHEST  2 VIEW COMPARISON:  Chest x-ray dated 01/06/2006. FINDINGS: Heart size is normal. Cardiomediastinal silhouette is normal in size and configuration. Lungs are clear. No evidence of pneumonia or pleural effusion. Osseous structures about the chest are unremarkable. IMPRESSION: No active disease. Electronically Signed   By: Franki Cabot M.D.   On: 04/20/2016 17:16    Subjective: Pt is tearful that his son is in the hospital, he has no more chest pain or SOB.   Discharge Exam: Filed Vitals:   04/20/16 2103 04/21/16  0500  BP: 107/57 119/81  Pulse: 92 76  Temp: 98.6 F (37 C) 97.5 F (36.4 C)  Resp: 20    Filed Vitals:   04/20/16 2008 04/20/16 2059 04/20/16 2103 04/21/16 0500  BP:   107/57 119/81  Pulse:   92 76  Temp:   98.6 F (37 C) 97.5 F (36.4 C)  TempSrc:   Oral Oral  Resp:   20   Height:  5' 10.5" (1.791 m)    Weight:  298 lb 4.8 oz (135.308 kg)  293 lb 6.4 oz (133.085 kg)  SpO2: 93%  95% 94%   General: Pt is alert, awake, not in acute distress  Cardiovascular: RRR, S1/S2 +, no rubs, no gallops Respiratory: CTA bilaterally, no wheezing, no rhonchi Abdominal: Soft, NT, ND, bowel sounds + Extremities: no edema, no cyanosis  The results of significant diagnostics from this hospitalization (including imaging, microbiology, ancillary and laboratory) are listed below for reference.    Microbiology: No results found for this or any previous visit (from the past 240 hour(s)).   Labs: BNP (last 3 results)  Recent Labs  04/20/16 2238  BNP [id number]   Basic Metabolic Panel:  Recent Labs Lab 04/20/16 1626  NA 139  K 4.2  CL 103  CO2 27  GLUCOSE 126*  BUN 15  CREATININE 1.21  CALCIUM 9.9   Liver Function Tests: No results for input(s): AST, ALT, ALKPHOS, BILITOT, PROT, ALBUMIN in the last 168 hours. No results for input(s): LIPASE, AMYLASE in the last 168 hours. No results for input(s): AMMONIA in the last 168 hours. CBC:  Recent Labs Lab 04/20/16 1626  WBC 11.2*  HGB 15.0  HCT 44.8  MCV 84.4  PLT 248   Cardiac Enzymes:  Recent Labs Lab 04/20/16 1626 04/20/16 2238 04/21/16 0414  TROPONINI <0.03 0.05* 0.03   BNP: Invalid input(s): POCBNP CBG: No results for input(s): GLUCAP in the last 168 hours. D-Dimer  Recent Labs  04/20/16 1710  DDIMER <0.27   Hgb A1c No results for input(s): HGBA1C in the last 72 hours. Lipid Profile No results for input(s): CHOL, HDL, LDLCALC, TRIG, CHOLHDL, LDLDIRECT in the last 72 hours. Thyroid function studies  Recent  Labs  04/20/16 2238  TSH 3.600   Anemia work up No results for input(s): VITAMINB12, FOLATE, FERRITIN, TIBC, IRON, RETICCTPCT in the last 72 hours. Urinalysis    Component Value Date/Time   APPEARANCEUR Clear 12/04/2015 1038   GLUCOSEU Negative 12/04/2015 1038   BILIRUBINUR Negative 12/04/2015 1038   PROTEINUR 3+* 12/04/2015 1038   NITRITE Negative 12/04/2015 1038   LEUKOCYTESUR Negative 12/04/2015 1038   Sepsis Labs Invalid input(s): PROCALCITONIN,  WBC,  LACTICIDVEN Microbiology No results found for this or any previous visit (from the past 240 hour(s)).  Time coordinating discharge: 23 minutes  SIGNED:  Irwin Brakeman, MD  [facility] 04/21/2016, 9:55 AM Pager   If 7PM-7AM, please contact night-coverage [email or web address] Password [email or web address]

## 2016-04-22 ENCOUNTER — Other Ambulatory Visit: Payer: Self-pay | Admitting: *Deleted

## 2016-04-22 DIAGNOSIS — R0683 Snoring: Secondary | ICD-10-CM

## 2016-04-22 DIAGNOSIS — G4730 Sleep apnea, unspecified: Secondary | ICD-10-CM

## 2016-04-22 DIAGNOSIS — G478 Other sleep disorders: Secondary | ICD-10-CM

## 2016-04-25 ENCOUNTER — Other Ambulatory Visit: Payer: Self-pay | Admitting: *Deleted

## 2016-04-25 DIAGNOSIS — G478 Other sleep disorders: Secondary | ICD-10-CM

## 2016-04-25 DIAGNOSIS — G4700 Insomnia, unspecified: Secondary | ICD-10-CM

## 2016-04-25 DIAGNOSIS — R0683 Snoring: Secondary | ICD-10-CM

## 2016-04-25 DIAGNOSIS — G4719 Other hypersomnia: Secondary | ICD-10-CM

## 2016-05-12 DIAGNOSIS — F329 Major depressive disorder, single episode, unspecified: Secondary | ICD-10-CM | POA: Diagnosis not present

## 2016-05-12 DIAGNOSIS — N400 Enlarged prostate without lower urinary tract symptoms: Secondary | ICD-10-CM | POA: Diagnosis not present

## 2016-05-12 DIAGNOSIS — F419 Anxiety disorder, unspecified: Secondary | ICD-10-CM | POA: Diagnosis not present

## 2016-05-12 DIAGNOSIS — N3946 Mixed incontinence: Secondary | ICD-10-CM | POA: Diagnosis not present

## 2016-05-12 DIAGNOSIS — Z1389 Encounter for screening for other disorder: Secondary | ICD-10-CM | POA: Diagnosis not present

## 2016-05-12 DIAGNOSIS — Z6841 Body Mass Index (BMI) 40.0 and over, adult: Secondary | ICD-10-CM | POA: Diagnosis not present

## 2016-05-12 DIAGNOSIS — E782 Mixed hyperlipidemia: Secondary | ICD-10-CM | POA: Diagnosis not present

## 2016-05-12 DIAGNOSIS — I10 Essential (primary) hypertension: Secondary | ICD-10-CM | POA: Diagnosis not present

## 2016-06-05 ENCOUNTER — Encounter (HOSPITAL_BASED_OUTPATIENT_CLINIC_OR_DEPARTMENT_OTHER): Payer: Commercial Managed Care - HMO

## 2016-06-24 ENCOUNTER — Ambulatory Visit: Payer: Commercial Managed Care - HMO

## 2016-08-06 ENCOUNTER — Ambulatory Visit (INDEPENDENT_AMBULATORY_CARE_PROVIDER_SITE_OTHER): Payer: Commercial Managed Care - HMO | Admitting: Urology

## 2016-08-06 DIAGNOSIS — N410 Acute prostatitis: Secondary | ICD-10-CM

## 2016-08-06 DIAGNOSIS — R972 Elevated prostate specific antigen [PSA]: Secondary | ICD-10-CM | POA: Diagnosis not present

## 2016-08-29 ENCOUNTER — Encounter (HOSPITAL_BASED_OUTPATIENT_CLINIC_OR_DEPARTMENT_OTHER): Payer: Commercial Managed Care - HMO

## 2016-09-02 DIAGNOSIS — Z1389 Encounter for screening for other disorder: Secondary | ICD-10-CM | POA: Diagnosis not present

## 2016-09-02 DIAGNOSIS — E063 Autoimmune thyroiditis: Secondary | ICD-10-CM | POA: Diagnosis not present

## 2016-09-02 DIAGNOSIS — Z6841 Body Mass Index (BMI) 40.0 and over, adult: Secondary | ICD-10-CM | POA: Diagnosis not present

## 2016-09-02 DIAGNOSIS — Z23 Encounter for immunization: Secondary | ICD-10-CM | POA: Diagnosis not present

## 2016-09-02 DIAGNOSIS — E748 Other specified disorders of carbohydrate metabolism: Secondary | ICD-10-CM | POA: Diagnosis not present

## 2016-09-02 DIAGNOSIS — I10 Essential (primary) hypertension: Secondary | ICD-10-CM | POA: Diagnosis not present

## 2016-09-02 DIAGNOSIS — N401 Enlarged prostate with lower urinary tract symptoms: Secondary | ICD-10-CM | POA: Diagnosis not present

## 2016-09-02 DIAGNOSIS — N419 Inflammatory disease of prostate, unspecified: Secondary | ICD-10-CM | POA: Diagnosis not present

## 2016-10-09 DIAGNOSIS — E063 Autoimmune thyroiditis: Secondary | ICD-10-CM | POA: Diagnosis not present

## 2016-10-24 DIAGNOSIS — E039 Hypothyroidism, unspecified: Secondary | ICD-10-CM | POA: Diagnosis not present

## 2016-10-24 DIAGNOSIS — Z1389 Encounter for screening for other disorder: Secondary | ICD-10-CM | POA: Diagnosis not present

## 2016-10-24 DIAGNOSIS — Z6841 Body Mass Index (BMI) 40.0 and over, adult: Secondary | ICD-10-CM | POA: Diagnosis not present

## 2016-10-24 DIAGNOSIS — E782 Mixed hyperlipidemia: Secondary | ICD-10-CM | POA: Diagnosis not present

## 2016-10-24 DIAGNOSIS — I10 Essential (primary) hypertension: Secondary | ICD-10-CM | POA: Diagnosis not present

## 2016-10-24 DIAGNOSIS — R42 Dizziness and giddiness: Secondary | ICD-10-CM | POA: Diagnosis not present

## 2016-10-31 DIAGNOSIS — R972 Elevated prostate specific antigen [PSA]: Secondary | ICD-10-CM | POA: Diagnosis not present

## 2016-11-05 ENCOUNTER — Ambulatory Visit (INDEPENDENT_AMBULATORY_CARE_PROVIDER_SITE_OTHER): Payer: Medicare HMO | Admitting: Urology

## 2016-11-05 DIAGNOSIS — N401 Enlarged prostate with lower urinary tract symptoms: Secondary | ICD-10-CM

## 2016-11-05 DIAGNOSIS — R972 Elevated prostate specific antigen [PSA]: Secondary | ICD-10-CM

## 2016-11-11 DIAGNOSIS — H43812 Vitreous degeneration, left eye: Secondary | ICD-10-CM | POA: Diagnosis not present

## 2016-11-11 DIAGNOSIS — H53001 Unspecified amblyopia, right eye: Secondary | ICD-10-CM | POA: Diagnosis not present

## 2016-11-11 DIAGNOSIS — H26492 Other secondary cataract, left eye: Secondary | ICD-10-CM | POA: Diagnosis not present

## 2017-01-01 DIAGNOSIS — H2511 Age-related nuclear cataract, right eye: Secondary | ICD-10-CM | POA: Diagnosis not present

## 2017-01-02 ENCOUNTER — Encounter (HOSPITAL_BASED_OUTPATIENT_CLINIC_OR_DEPARTMENT_OTHER): Payer: Commercial Managed Care - HMO

## 2017-01-09 DIAGNOSIS — I10 Essential (primary) hypertension: Secondary | ICD-10-CM | POA: Diagnosis not present

## 2017-01-09 DIAGNOSIS — Z6841 Body Mass Index (BMI) 40.0 and over, adult: Secondary | ICD-10-CM | POA: Diagnosis not present

## 2017-01-09 DIAGNOSIS — R5383 Other fatigue: Secondary | ICD-10-CM | POA: Diagnosis not present

## 2017-01-09 DIAGNOSIS — E782 Mixed hyperlipidemia: Secondary | ICD-10-CM | POA: Diagnosis not present

## 2017-01-09 DIAGNOSIS — Z23 Encounter for immunization: Secondary | ICD-10-CM | POA: Diagnosis not present

## 2017-01-12 DIAGNOSIS — G4730 Sleep apnea, unspecified: Secondary | ICD-10-CM | POA: Diagnosis not present

## 2017-01-13 DIAGNOSIS — G4730 Sleep apnea, unspecified: Secondary | ICD-10-CM | POA: Diagnosis not present

## 2017-01-13 DIAGNOSIS — R0683 Snoring: Secondary | ICD-10-CM | POA: Diagnosis not present

## 2017-01-13 DIAGNOSIS — R5383 Other fatigue: Secondary | ICD-10-CM | POA: Diagnosis not present

## 2017-02-02 ENCOUNTER — Ambulatory Visit: Payer: Commercial Managed Care - HMO | Admitting: Cardiovascular Disease

## 2017-02-02 DIAGNOSIS — R972 Elevated prostate specific antigen [PSA]: Secondary | ICD-10-CM | POA: Diagnosis not present

## 2017-02-04 ENCOUNTER — Ambulatory Visit (INDEPENDENT_AMBULATORY_CARE_PROVIDER_SITE_OTHER): Payer: Medicare HMO | Admitting: Urology

## 2017-02-04 DIAGNOSIS — R972 Elevated prostate specific antigen [PSA]: Secondary | ICD-10-CM | POA: Diagnosis not present

## 2017-02-04 DIAGNOSIS — N401 Enlarged prostate with lower urinary tract symptoms: Secondary | ICD-10-CM | POA: Diagnosis not present

## 2017-02-04 DIAGNOSIS — R351 Nocturia: Secondary | ICD-10-CM | POA: Diagnosis not present

## 2017-02-12 DIAGNOSIS — H53001 Unspecified amblyopia, right eye: Secondary | ICD-10-CM | POA: Diagnosis not present

## 2017-02-12 DIAGNOSIS — R5383 Other fatigue: Secondary | ICD-10-CM | POA: Diagnosis not present

## 2017-02-16 ENCOUNTER — Ambulatory Visit (INDEPENDENT_AMBULATORY_CARE_PROVIDER_SITE_OTHER): Payer: Medicare HMO | Admitting: Cardiovascular Disease

## 2017-02-16 ENCOUNTER — Encounter: Payer: Self-pay | Admitting: Cardiovascular Disease

## 2017-02-16 VITALS — BP 136/86 | HR 102 | Ht 70.0 in | Wt 302.0 lb

## 2017-02-16 DIAGNOSIS — E782 Mixed hyperlipidemia: Secondary | ICD-10-CM

## 2017-02-16 DIAGNOSIS — I10 Essential (primary) hypertension: Secondary | ICD-10-CM | POA: Diagnosis not present

## 2017-02-16 DIAGNOSIS — I25118 Atherosclerotic heart disease of native coronary artery with other forms of angina pectoris: Secondary | ICD-10-CM

## 2017-02-16 DIAGNOSIS — R0609 Other forms of dyspnea: Secondary | ICD-10-CM

## 2017-02-16 NOTE — Patient Instructions (Addendum)
Your physician wants you to follow-up in:  1 year You will receive a reminder letter in the mail two months in advance. If you don't receive a letter, please call our office to schedule the follow-up appointment.      Your physician has requested that you have an echocardiogram. Echocardiography is a painless test that uses sound waves to create images of your heart. It provides your doctor with information about the size and shape of your heart and how well your heart's chambers and valves are working. This procedure takes approximately one hour. There are no restrictions for this procedure.     Your physician recommends that you continue on your current medications as directed. Please refer to the Current Medication list given to you today.    If you need a refill on your cardiac medications before your next appointment, please call your pharmacy.     Thank you for choosing [facility] !

## 2017-02-16 NOTE — Progress Notes (Signed)
CARDIOLOGY CONSULT NOTE  Patient ID: Brandon Warren MRN: [id number] DOB/AGE: [birth year] 66 y.o.  Admit date: (Not on file) Primary Physician: Glo Herring, MD Referring Physician: Gerarda Fraction  Reason for Consultation: CAD  HPI: Brandon Warren is a 66 y.o. male who is being seen today for the evaluation of CAD at the request of Dr. Gerarda Fraction. He has a history of coronary artery disease with previous stenting of the obtuse marginal branch in 2005 with a Taxus drug-eluting stent. A follow-up catheterization in 2006 as part of a research trial showed that his stents were patent. I saw him once before in July 2014.  He denies exertional chest pain. He has not had to use nitroglycerin. He was initially diagnosed sleep apnea and has an appointment tomorrow to get CPAP.  His son tried to commit suicide in 2017 and the patient has experienced depression since that time. He does not see a behavioral health specialist.   He has had a 20-25 pound weight gain in the past 6 months. Coinciding with that he has had exertional dyspnea with activities such as bathing and climbing stairs. Denies orthopnea and leg swelling. Has occasional ankle swelling at the end of the day.  He checks his blood pressure every 3-4 days and says it has been controlled. I have requested that he drop off his blood pressure log for my personal review.  I personally interpreted ECG performed on 04/21/2016 which demonstrated normal sinus rhythm with an incomplete right bundle-branch block, QRS duration 100 ms.  ECG performed in the office today which I ordered and personally interpreted demonstrates normal sinus rhythm with an incomplete RBBB, QRS dur 106 ms.  His daughter is a Music therapist whom he says takes good care of him.    Allergies  Allergen Reactions  . Meat Extract Diarrhea, Nausea Only and Other (See Comments)    alphagal reaction  . Lipitor [Atorvastatin] Other (See Comments)    Muscle cramps  .  Simvastatin Other (See Comments)    Muscle cramps     Current Outpatient Prescriptions  Medication Sig Dispense Refill  . ALPRAZolam (XANAX) 0.5 MG tablet Take 0.5 mg by mouth 3 (three) times daily as needed for anxiety or sleep. Reported on 12/04/2015    . amLODipine (NORVASC) 5 MG tablet Take 5 mg by mouth 2 (two) times daily.    Marland Kitchen aspirin EC 81 MG tablet Take 81 mg by mouth every evening. Reported on 12/18/2015    . diazepam (VALIUM) 10 MG tablet Take 10 mg by mouth every 6 (six) hours as needed for anxiety.    Marland Kitchen ezetimibe (ZETIA) 10 MG tablet Take 10 mg by mouth at bedtime.     . furosemide (LASIX) 20 MG tablet Take 20 mg by mouth as needed for fluid or edema.     Marland Kitchen levothyroxine (SYNTHROID, LEVOTHROID) 200 MCG tablet Take 250 mcg by mouth daily before breakfast.     . losartan (COZAAR) 100 MG tablet Take 100 mg by mouth every evening.     . metoprolol tartrate (LOPRESSOR) 25 MG tablet Take 25 mg by mouth 2 (two) times daily.     . tamsulosin (FLOMAX) 0.4 MG CAPS Take 0.4 mg by mouth every evening.      No current facility-administered medications for this visit.     Past Medical History:  Diagnosis Date  . Arthritis   . Hypercholesterolemia   . Hypertension   . Hypothyroidism   . MI (  myocardial infarction) ([place])    in his 58s    Past Surgical History:  Procedure Laterality Date  . APPENDECTOMY    . CATARACT EXTRACTION Left   . COLONOSCOPY N/A 02/01/2014   Procedure: COLONOSCOPY;  Surgeon: Daneil Dolin, MD;  Location: [facility];  Service: Endoscopy;  Laterality: N/A;  8:30  . CORONARY ANGIOPLASTY WITH STENT PLACEMENT     1 stent    Social History   Social History  . Marital status: Married    Spouse name: N/A  . Number of children: N/A  . Years of education: N/A   Occupational History  . self-employed    Social History Main Topics  . Smoking status: Former Smoker    Packs/day: 2.00    Years: 30.00    Types: Cigarettes    Quit date: 02/02/2004  . Smokeless  tobacco: Never Used  . Alcohol use Yes     Comment: occasional  . Drug use: No  . Sexual activity: Yes    Birth control/ protection: None   Other Topics Concern  . Not on file   Social History Narrative  . No narrative on file     No family history of premature CAD in 1st degree relatives.  Current Meds  Medication Sig  . ALPRAZolam (XANAX) 0.5 MG tablet Take 0.5 mg by mouth 3 (three) times daily as needed for anxiety or sleep. Reported on 12/04/2015  . amLODipine (NORVASC) 5 MG tablet Take 5 mg by mouth 2 (two) times daily.  Marland Kitchen aspirin EC 81 MG tablet Take 81 mg by mouth every evening. Reported on 12/18/2015  . diazepam (VALIUM) 10 MG tablet Take 10 mg by mouth every 6 (six) hours as needed for anxiety.  Marland Kitchen ezetimibe (ZETIA) 10 MG tablet Take 10 mg by mouth at bedtime.   . furosemide (LASIX) 20 MG tablet Take 20 mg by mouth as needed for fluid or edema.   Marland Kitchen levothyroxine (SYNTHROID, LEVOTHROID) 200 MCG tablet Take 250 mcg by mouth daily before breakfast.   . losartan (COZAAR) 100 MG tablet Take 100 mg by mouth every evening.   . metoprolol tartrate (LOPRESSOR) 25 MG tablet Take 25 mg by mouth 2 (two) times daily.   . tamsulosin (FLOMAX) 0.4 MG CAPS Take 0.4 mg by mouth every evening.       Review of systems complete and found to be negative unless listed above in HPI    Physical exam Blood pressure 136/86, pulse (!) 102, height 5\' 10"  (1.778 m), weight (!) 302 lb (137 kg), SpO2 92 %. General: NAD Neck: No JVD, no thyromegaly or thyroid nodule.  Lungs: Clear to auscultation bilaterally with normal respiratory effort. CV: Nondisplaced PMI. Regular rate and rhythm, normal S1/S2, no S3/S4, no murmur.  No peripheral edema.  No carotid bruit.    Abdomen: Firm, nontender, obese.  Skin: Intact without lesions or rashes.  Neurologic: Alert and oriented x 3.  Psych: Normal affect. Extremities: No clubbing or cyanosis.  HEENT: Normal.   ECG: Most recent ECG reviewed.   Labs: Lab  Results  Component Value Date/Time   K 4.2 04/20/2016 04:26 PM   BUN 15 04/20/2016 04:26 PM   CREATININE 1.21 04/20/2016 04:26 PM   TSH 3.600 04/20/2016 10:38 PM   HGB 15.0 04/20/2016 04:26 PM     Lipids: No results found for: LDLCALC, LDLDIRECT, CHOL, TRIG, HDL      ASSESSMENT AND PLAN:  1. Coronary artery disease with prior stenting of the obtuse  marginal with exertional dyspnea: ECG is unremarkable. Exertional dyspnea may be due to excess weight gain in the context of morbid obesity. Denies chest pain. I will order a 2-D echocardiogram with Doppler to evaluate cardiac structure, function, and regional wall motion. Continue ASA, Zetia, and metoprolol.  2. Hypertension: Reasonably controlled. No changes. Needs weight loss.  3. Hyperlipidemia: Continue Zetia. I will obtain copy of lipids.  4. Morbid obesity: Needs significant weight loss. Needs to exercise. Also needs treatment for depression which will lead to fatigue and obesity.  Disposition: Follow up in 1 year  Signed: Kate Sable, M.D., F.A.C.C.  02/16/2017, 8:50 AM

## 2017-02-17 DIAGNOSIS — G4733 Obstructive sleep apnea (adult) (pediatric): Secondary | ICD-10-CM | POA: Diagnosis not present

## 2017-02-19 ENCOUNTER — Ambulatory Visit (HOSPITAL_COMMUNITY)
Admission: RE | Admit: 2017-02-19 | Discharge: 2017-02-19 | Disposition: A | Discharge status: Home / Self Care | Payer: Medicare HMO | Source: Ambulatory Visit | Attending: Cardiovascular Disease | Admitting: Cardiovascular Disease

## 2017-02-19 ENCOUNTER — Telehealth: Payer: Self-pay | Admitting: *Deleted

## 2017-02-19 ENCOUNTER — Encounter: Payer: Self-pay | Admitting: Cardiovascular Disease

## 2017-02-19 DIAGNOSIS — I119 Hypertensive heart disease without heart failure: Secondary | ICD-10-CM | POA: Diagnosis not present

## 2017-02-19 DIAGNOSIS — Z6841 Body Mass Index (BMI) 40.0 and over, adult: Secondary | ICD-10-CM | POA: Insufficient documentation

## 2017-02-19 DIAGNOSIS — R0609 Other forms of dyspnea: Secondary | ICD-10-CM | POA: Insufficient documentation

## 2017-02-19 DIAGNOSIS — E784 Other hyperlipidemia: Secondary | ICD-10-CM | POA: Insufficient documentation

## 2017-02-19 DIAGNOSIS — I2510 Atherosclerotic heart disease of native coronary artery without angina pectoris: Secondary | ICD-10-CM | POA: Diagnosis not present

## 2017-02-19 LAB — ECHOCARDIOGRAM COMPLETE
AV Area mean vel: 2.61 cm2
AV Mean grad: 3 mmHg
AV Peak grad: 9 mmHg
AV area mean vel ind: 1.05 cm2/m2
AV peak Index: 1.08
AV vel: 2.85
AVA: 2.85 cm2
AVAREAVTI: 2.7 cm2
AVAREAVTIIND: 1.14 cm2/m2
AVCELMEANRAT: 0.92
AVLVOTPG: 8 mmHg
AVPKVEL: 147 cm/s
Ao pk vel: 0.95 m/s
CHL CUP AV VALUE AREA INDEX: 1.14
CHL CUP DOP CALC LVOT VTI: 20.9 cm
CHL CUP STROKE VOLUME: 53 mL
DOP CAL AO MEAN VELOCITY: 79.5 cm/s
EERAT: 9.02
EWDT: 229 ms
FS: 33 % (ref 28–44)
IV/PV OW: 1.04
LA ID, A-P, ES: 37 mm
LA diam index: 1.49 cm/m2
LA vol A4C: 24.5 ml
LAVOL: 27.6 mL
LAVOLIN: 11.1 mL/m2
LDCA: 2.84 cm2
LEFT ATRIUM END SYS DIAM: 37 mm
LV E/e' medial: 9.02
LV E/e'average: 9.02
LV SIMPSON'S DISK: 63
LV e' LATERAL: 6.85 cm/s
LV sys vol: 32 mL
LVDIAVOL: 85 mL (ref 62–150)
LVDIAVOLIN: 34 mL/m2
LVOT SV: 59 mL
LVOT diameter: 19 mm
LVOT peak VTI: 1 cm
LVOTPV: 140 cm/s
LVSYSVOLIN: 13 mL/m2
Lateral S' vel: 17.5 cm/s
MV Dec: 229
MV pk E vel: 61.8 m/s
MVPKAVEL: 85.2 m/s
PW: 11.7 mm — AB (ref 0.6–1.1)
RV TAPSE: 23.9 mm
TDI e' lateral: 6.85
TDI e' medial: 6.74
VTI: 20.8 cm

## 2017-02-19 NOTE — Telephone Encounter (Signed)
-----   Message from Herminio Commons, MD sent at 02/19/2017  4:03 PM EDT ----- Normal pumping function.

## 2017-02-19 NOTE — Progress Notes (Signed)
*  PRELIMINARY RESULTS* Echocardiogram 2D Echocardiogram has been performed.  Brandon Warren 02/19/2017, 3:37 PM

## 2017-02-19 NOTE — Telephone Encounter (Signed)
Called patient with test results. No answer. Left message to call back.  

## 2017-02-26 ENCOUNTER — Encounter: Payer: Self-pay | Admitting: Cardiovascular Disease

## 2017-02-26 DIAGNOSIS — E291 Testicular hypofunction: Secondary | ICD-10-CM | POA: Diagnosis not present

## 2017-02-27 ENCOUNTER — Telehealth: Payer: Self-pay

## 2017-02-27 NOTE — Telephone Encounter (Signed)
Brandon Warren is not willing to write rx, they told him to see cardiologist.Will you write the rx for him?

## 2017-02-27 NOTE — Telephone Encounter (Signed)
  Originally asked 02/19/17, received no reply.Perhaps I'm asking in the wrong manor.Please advise.   Visit Follow-Up Question Message body: Forgot to ask at last visit to Dr. Bronson Ing   If today's echocardiogram has a positive result; because I have a BMI of 43.33; and because I am willing to have monthly follow up visits (or as directed).  Is it appropriate to ask for a diet suppressant such as Phentermine?  Setting a goal of getting my BMI back into the mid 30s.

## 2017-02-27 NOTE — Telephone Encounter (Signed)
I believe I already responded to this. Yes, that would be fine.

## 2017-03-01 NOTE — Telephone Encounter (Signed)
No, would recommend he see endocrinology if PCP unwilling to manage obesity.

## 2017-03-19 DIAGNOSIS — G4733 Obstructive sleep apnea (adult) (pediatric): Secondary | ICD-10-CM | POA: Diagnosis not present

## 2017-03-25 DIAGNOSIS — G4733 Obstructive sleep apnea (adult) (pediatric): Secondary | ICD-10-CM | POA: Diagnosis not present

## 2017-04-17 DIAGNOSIS — G4733 Obstructive sleep apnea (adult) (pediatric): Secondary | ICD-10-CM | POA: Diagnosis not present

## 2017-04-17 DIAGNOSIS — E782 Mixed hyperlipidemia: Secondary | ICD-10-CM | POA: Diagnosis not present

## 2017-04-17 DIAGNOSIS — Z91018 Allergy to other foods: Secondary | ICD-10-CM | POA: Diagnosis not present

## 2017-04-17 DIAGNOSIS — E063 Autoimmune thyroiditis: Secondary | ICD-10-CM | POA: Diagnosis not present

## 2017-04-17 DIAGNOSIS — I10 Essential (primary) hypertension: Secondary | ICD-10-CM | POA: Diagnosis not present

## 2017-04-17 DIAGNOSIS — E221 Hyperprolactinemia: Secondary | ICD-10-CM | POA: Diagnosis not present

## 2017-04-17 DIAGNOSIS — Z6841 Body Mass Index (BMI) 40.0 and over, adult: Secondary | ICD-10-CM | POA: Diagnosis not present

## 2017-04-17 DIAGNOSIS — Z1389 Encounter for screening for other disorder: Secondary | ICD-10-CM | POA: Diagnosis not present

## 2017-04-19 DIAGNOSIS — G4733 Obstructive sleep apnea (adult) (pediatric): Secondary | ICD-10-CM | POA: Diagnosis not present

## 2017-05-12 ENCOUNTER — Ambulatory Visit (INDEPENDENT_AMBULATORY_CARE_PROVIDER_SITE_OTHER): Payer: Self-pay | Admitting: "Endocrinology

## 2017-05-12 ENCOUNTER — Encounter: Payer: Self-pay | Admitting: "Endocrinology

## 2017-05-12 VITALS — BP 152/80 | HR 83 | Ht 70.0 in | Wt 312.0 lb

## 2017-05-12 DIAGNOSIS — E221 Hyperprolactinemia: Secondary | ICD-10-CM | POA: Diagnosis not present

## 2017-05-12 DIAGNOSIS — R739 Hyperglycemia, unspecified: Secondary | ICD-10-CM | POA: Diagnosis not present

## 2017-05-12 LAB — T4, FREE: FREE T4: 1.5 ng/dL (ref 0.8–1.8)

## 2017-05-12 LAB — TSH: TSH: 1.33 m[IU]/L (ref 0.40–4.50)

## 2017-05-12 NOTE — Patient Instructions (Signed)

## 2017-05-12 NOTE — Progress Notes (Signed)
Subjective:    Patient ID: Brandon Warren, male    DOB: 06/05/1951, PCP Redmond School, MD   Past Medical History:  Diagnosis Date  . Arthritis   . Hypercholesterolemia   . Hypertension   . Hypothyroidism   . MI (myocardial infarction) ([place])    in his 66s   Past Surgical History:  Procedure Laterality Date  . APPENDECTOMY    . CATARACT EXTRACTION Left   . COLONOSCOPY N/A 02/01/2014   Procedure: COLONOSCOPY;  Surgeon: Daneil Dolin, MD;  Location: [facility];  Service: Endoscopy;  Laterality: N/A;  8:30  . CORONARY ANGIOPLASTY WITH STENT PLACEMENT     1 stent   Social History   Social History  . Marital status: Married    Spouse name: N/A  . Number of children: N/A  . Years of education: N/A   Occupational History  . self-employed    Social History Main Topics  . Smoking status: Former Smoker    Packs/day: 2.00    Years: 30.00    Types: Cigarettes    Quit date: 02/02/2004  . Smokeless tobacco: Never Used  . Alcohol use Yes     Comment: occasional  . Drug use: No  . Sexual activity: Yes    Birth control/ protection: None   Other Topics Concern  . None   Social History Narrative  . None   Outpatient Encounter Prescriptions as of 05/12/2017  Medication Sig  . alfuzosin (UROXATRAL) 10 MG 24 hr tablet Take 10 mg by mouth 2 (two) times daily.  Marland Kitchen amLODipine (NORVASC) 5 MG tablet Take 5 mg by mouth 2 (two) times daily.  Marland Kitchen aspirin EC 81 MG tablet Take 81 mg by mouth every evening. Reported on 12/18/2015  . diazepam (VALIUM) 10 MG tablet Take 10 mg by mouth every 6 (six) hours as needed for anxiety.  Marland Kitchen ezetimibe (ZETIA) 10 MG tablet Take 10 mg by mouth at bedtime.   . finasteride (PROSCAR) 5 MG tablet Take 10 mg by mouth daily.  Marland Kitchen levothyroxine (SYNTHROID, LEVOTHROID) 200 MCG tablet Take 250 mcg by mouth daily before breakfast.   . losartan (COZAAR) 100 MG tablet Take 100 mg by mouth every evening.   . metoprolol tartrate (LOPRESSOR) 25 MG tablet Take 25 mg by  mouth 2 (two) times daily.   . [DISCONTINUED] ALPRAZolam (XANAX) 0.5 MG tablet Take 0.5 mg by mouth 3 (three) times daily as needed for anxiety or sleep. Reported on 12/04/2015  . [DISCONTINUED] furosemide (LASIX) 20 MG tablet Take 20 mg by mouth as needed for fluid or edema.   . [DISCONTINUED] tamsulosin (FLOMAX) 0.4 MG CAPS Take 0.4 mg by mouth every evening.    No facility-administered encounter medications on file as of 05/12/2017.    ALLERGIES: Allergies  Allergen Reactions  . Meat Extract Diarrhea, Nausea Only and Other (See Comments)    alphagal reaction  . Lipitor [Atorvastatin] Other (See Comments)    Muscle cramps  . Simvastatin Other (See Comments)    Muscle cramps     VACCINATION STATUS:  There is no immunization history on file for this patient.  HPI 66 year old gentleman with medical history as above. He is being seen in consultation for hyperprolactinemia requested by Dr. Gerarda Fraction. -He denies any prior history of pituitary, adrenal dysfunction. - He was found to have high prolactin level of 19.1 (normal 4-15.2) on 02/26/2017. -He is not on any antipsychotics or antidepressants. -He is known to have hypothyroidism for the last  25 years currently on a large dose of levothyroxine at 250 g by mouth every morning. He reports family history of thyroid dysfunction in distant cousins. 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 - He fathers 2 grown children. - He complains of progressive weight gain over the years, gained approximately 50 pounds over the last 6 years. -  He has history of coronary artery disease which required angioplasty in 1993 and 2005 - at age 66.  He has significant family history of coronary artery disease.  Review of Systems  Constitutional: + progressive weight gain, + fatigue, + subjective hyperthermia, no subjective hypothermia Eyes: no blurry vision, no  xerophthalmia ENT: no sore throat, no nodules palpated in throat, no dysphagia/odynophagia, no hoarseness Cardiovascular: no Chest Pain, no Shortness of Breath, no palpitations, no leg swelling Respiratory: no cough, no SOB Gastrointestinal: no Nausea/Vomiting/Diarhhea Musculoskeletal: no muscle/joint aches Skin: no rashes Neurological: no tremors, no numbness, no tingling, no dizziness Psychiatric: no depression, no anxiety  Objective:    BP (!) 152/80   Pulse 83   Ht 5\' 10"  (1.778 m)   Wt (!) 312 lb (141.5 kg)   BMI 44.77 kg/m   Wt Readings from Last 3 Encounters:  05/12/17 (!) 312 lb (141.5 kg)  02/16/17 (!) 302 lb (137 kg)  04/21/16 293 lb 6.4 oz (133.1 kg)    Physical Exam  Constitutional: + Obese with a BMI of 44.77, not in acute distress, normal state of mind Eyes: PERRLA, EOMI, no exophthalmos ENT: moist mucous membranes, no thyromegaly, no cervical lymphadenopathy Cardiovascular: normal precordial activity, distant heart sounds due to body habitus, no Murmur/Rubs/Gallops Respiratory:  adequate breathing efforts, no gross chest deformity, Clear to auscultation bilaterally Gastrointestinal: abdomen is obese, soft, Non -tender, +distension, Bowel Sounds present Musculoskeletal: no gross deformities, strength intact in all four extremities Skin: moist, warm, no rashes Neurological: no tremor with outstretched hands, Deep tendon reflexes normal in all four extremities.  CMP ( most recent) CMP     Component Value Date/Time   NA 139 04/20/2016 1626   K 4.2 04/20/2016 1626   CL 103 04/20/2016 1626   CO2 27 04/20/2016 1626   GLUCOSE 126 (H) 04/20/2016 1626   BUN 15 04/20/2016 1626   CREATININE 1.21 04/20/2016 1626   CALCIUM 9.9 04/20/2016 1626   GFRNONAA >60 04/20/2016 1626   GFRAA >60 04/20/2016 1626    02/26/2017 labs show: LH 5.3, total testosterone 192 TSH 0.66, prolactin 19.1 ( 4-15.2), estradiol 20.1       Assessment & Plan:   1. Hyperprolactinemia  ([place]) - He has mild hyperprolactinemia of 19.1, etiology unclear at this time. Patient is not on antipsychotics nor antidepressants. 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 - In light of hypogonadism, he may benefit from prolactin lowering medications. However, I will proceed to repeat his labs including rocking, FSH/LH, TSH/free T4. - Suspicion for significant pituitary neoplasm is low at this time, hence we will defer imaging of pituitary/sella.  2. Hypothyroidism:  - He seems to have well settled diagnosis of hypothyroidism since age 32. The dose of his levothyroxine have been adjusted over the years to current high-dose of 250 g by mouth every morning. - This dose may be appropriate for his body habitus. -However he will need thyroid function tests including free T4 along with TSH.  - We discussed about correct intake of levothyroxine,  at fasting, with water, separated by at least 30 minutes from breakfast, and separated by more than 4 hours from calcium, iron, multivitamins, acid reflux medications (PPIs). -Patient is made aware of the fact that thyroid hormone replacement is needed for life, dose to be adjusted by periodic monitoring of thyroid function tests.   3. Morbid obesity ([place]) -  He is interested and discussion about weight control.  - Heavy weight is  his biggest risk factor for more cardiovascular morbidity. In light of the fact that he did have premature coronary artery disease at age 77, he will benefit the most from weight loss. I will also add A1c to screen him for diabetes type 2. - I have counseled him on diet management  by adopting a carbohydrate restricted/protein rich diet. - He advised to pick up moderate physical activity by walking 30-60 minutes at least 4 days a week.  - Suggestion is made for him to avoid simple carbohydrates  from his diet including Cakes , Sweet Desserts, Ice Cream,  Soda (  diet and regular) , Sweet Tea ,  Candies,  Chips, Cookies, Artificial Sweeteners, alcohol/beer,   and "Sugar-free" Products .  - I encouraged him to switch to  unprocessed or minimally processed complex starch and increased protein intake (animal or plant source), fruits, and vegetables.  -he is advised to stick to a routine mealtimes to eat 3 balanced meals  a day and avoid unnecessary snacks. - The patient will be scheduled with Jearld Fenton, RDN, CDE for individualized DM education.  -We discussed at length about other choices of weight management including considering  one of the recently approved weight loss medications and weight loss surgery, which would be considered step-by-step.  - 45 minutes of time was spent on the care of this patient , 50% of which was applied for counseling on diabetes complications and their preventions.  - I advised patient to maintain close follow up with Redmond School, MD for primary care needs. Follow up plan: Return in about 1 week (around 05/19/2017) for labs today.  Glade Lloyd, MD Phone: [phone]  Fax: [phone]   05/12/2017, 9:49 AM

## 2017-05-13 LAB — HEMOGLOBIN A1C
HEMOGLOBIN A1C: 7 % — AB (ref <5.7)
Mean Plasma Glucose: 154 mg/dL

## 2017-05-13 LAB — FSH/LH
FSH: 8.7 m[IU]/mL — AB (ref 1.6–8.0)
LH: 6.1 m[IU]/mL (ref 1.6–15.2)

## 2017-05-13 LAB — PROLACTIN: Prolactin: 8.6 ng/mL (ref 2.0–18.0)

## 2017-05-19 DIAGNOSIS — G4733 Obstructive sleep apnea (adult) (pediatric): Secondary | ICD-10-CM | POA: Diagnosis not present

## 2017-05-21 ENCOUNTER — Ambulatory Visit (INDEPENDENT_AMBULATORY_CARE_PROVIDER_SITE_OTHER): Payer: Medicare HMO | Admitting: "Endocrinology

## 2017-05-21 ENCOUNTER — Other Ambulatory Visit: Payer: Self-pay

## 2017-05-21 ENCOUNTER — Encounter: Payer: Self-pay | Admitting: "Endocrinology

## 2017-05-21 VITALS — BP 156/80 | HR 76 | Ht 70.0 in | Wt 311.0 lb

## 2017-05-21 DIAGNOSIS — E1169 Type 2 diabetes mellitus with other specified complication: Secondary | ICD-10-CM | POA: Insufficient documentation

## 2017-05-21 DIAGNOSIS — E1159 Type 2 diabetes mellitus with other circulatory complications: Secondary | ICD-10-CM | POA: Diagnosis not present

## 2017-05-21 DIAGNOSIS — E039 Hypothyroidism, unspecified: Secondary | ICD-10-CM | POA: Diagnosis not present

## 2017-05-21 DIAGNOSIS — E782 Mixed hyperlipidemia: Secondary | ICD-10-CM | POA: Diagnosis not present

## 2017-05-21 DIAGNOSIS — I10 Essential (primary) hypertension: Secondary | ICD-10-CM

## 2017-05-21 DIAGNOSIS — E221 Hyperprolactinemia: Secondary | ICD-10-CM

## 2017-05-21 DIAGNOSIS — E669 Obesity, unspecified: Secondary | ICD-10-CM

## 2017-05-21 MED ORDER — METFORMIN HCL 500 MG PO TABS: 500.0000 mg | ORAL_TABLET | Freq: Two times a day (BID) | ORAL | 3 refills | Status: DC

## 2017-05-21 MED ORDER — METOPROLOL TARTRATE 50 MG PO TABS: 50.0000 mg | ORAL_TABLET | Freq: Two times a day (BID) | ORAL | 3 refills | Status: DC

## 2017-05-21 MED ORDER — LOSARTAN POTASSIUM-HCTZ 100-25 MG PO TABS: 1.0000 | ORAL_TABLET | Freq: Every day | ORAL | 3 refills | Status: DC

## 2017-05-21 NOTE — Patient Instructions (Signed)

## 2017-05-21 NOTE — Progress Notes (Signed)
Subjective:    Patient ID: Merlyn Albert, male    DOB: 1951-05-06, PCP Redmond School, MD   Past Medical History:  Diagnosis Date  . Arthritis   . Hypercholesterolemia   . Hypertension   . Hypothyroidism   . MI (myocardial infarction) ([place])    in his 66s   Past Surgical History:  Procedure Laterality Date  . APPENDECTOMY    . CATARACT EXTRACTION Left   . COLONOSCOPY N/A 02/01/2014   Procedure: COLONOSCOPY;  Surgeon: Daneil Dolin, MD;  Location[facility];  Service: Endoscopy;  Laterality: N/A;  8:30  . CORONARY ANGIOPLASTY WITH STENT PLACEMENT     1 stent   Social History   Social History  . Marital status: Married    Spouse name: N/A  . Number of children: N/A  . Years of education: N/A   Occupational History  . self-employed    Social History Main Topics  . Smoking status: Former Smoker    Packs/day: 2.00    Years: 30.00    Types: Cigarettes    Quit date: 02/02/2004  . Smokeless tobacco: Never Used  . Alcohol use Yes     Comment: occasional  . Drug use: No  . Sexual activity: Yes    Birth control/ protection: None   Other Topics Concern  . None   Social History Narrative  . None   Outpatient Encounter Prescriptions as of 05/21/2017  Medication Sig  . alfuzosin (UROXATRAL) 10 MG 24 hr tablet Take 10 mg by mouth 2 (two) times daily.  Marland Kitchen aspirin EC 81 MG tablet Take 81 mg by mouth every evening. Reported on 12/18/2015  . diazepam (VALIUM) 10 MG tablet Take 10 mg by mouth every 6 (six) hours as needed for anxiety.  Marland Kitchen ezetimibe (ZETIA) 10 MG tablet Take 10 mg by mouth at bedtime.   . finasteride (PROSCAR) 5 MG tablet Take 10 mg by mouth daily.  Marland Kitchen levothyroxine (SYNTHROID, LEVOTHROID) 200 MCG tablet Take 250 mcg by mouth daily before breakfast.   . losartan (COZAAR) 100 MG tablet Take 100 mg by mouth every evening.   Marland Kitchen losartan-hydrochlorothiazide (HYZAAR) 100-25 MG tablet Take 1 tablet by mouth daily.  . metFORMIN (GLUCOPHAGE) 500 MG tablet Take 1  tablet (500 mg total) by mouth 2 (two) times daily with a meal.  . metoprolol tartrate (LOPRESSOR) 50 MG tablet Take 1 tablet (50 mg total) by mouth 2 (two) times daily.  . [DISCONTINUED] amLODipine (NORVASC) 5 MG tablet Take 5 mg by mouth 2 (two) times daily.  . [DISCONTINUED] metoprolol tartrate (LOPRESSOR) 25 MG tablet Take 50 mg by mouth 2 (two) times daily.   No facility-administered encounter medications on file as of 05/21/2017.    ALLERGIES: Allergies  Allergen Reactions  . Meat Extract Diarrhea, Nausea Only and Other (See Comments)    alphagal reaction  . Lipitor [Atorvastatin] Other (See Comments)    Muscle cramps  . Simvastatin Other (See Comments)    Muscle cramps     VACCINATION STATUS:  There is no immunization history on file for this patient.  Diabetes  He presents for his initial diabetic visit. He has type 2 diabetes mellitus. Onset time: Being diagnosed at age 23 years. His disease course has been stable. There are no hypoglycemic associated symptoms. There are no diabetic associated symptoms. There are no hypoglycemic complications. Symptoms are stable. Diabetic complications include heart disease. Risk factors for coronary artery disease include diabetes mellitus, dyslipidemia, hypertension, obesity, sedentary  lifestyle and tobacco exposure. When asked about current treatments, none were reported. His weight is increasing steadily. He is following a generally unhealthy diet. When asked about meal planning, he reported none. He has not had a previous visit with a dietitian. He rarely participates in exercise. An ACE inhibitor/angiotensin II receptor blocker is being taken.  Hypertension  This is a chronic problem. The current episode started more than 1 year ago. The problem is uncontrolled. Risk factors for coronary artery disease include diabetes mellitus, dyslipidemia, obesity, sedentary lifestyle, smoking/tobacco exposure and family history. Past treatments include  angiotensin blockers and diuretics. Hypertensive end-organ damage includes CAD/MI.  Hyperlipidemia  This is a chronic problem. The current episode started more than 1 year ago. The problem is uncontrolled. Exacerbating diseases include diabetes, hypothyroidism and obesity. Risk factors for coronary artery disease include diabetes mellitus, dyslipidemia, hypertension, male sex, obesity and a sedentary lifestyle.   Hyperprolactinemia -He denies any prior history of pituitary, adrenal dysfunction. - He was found to have high prolactin level of 19.1 (normal 4-15.2) on 02/26/2017, his repeat labs show normal prolactin of 8.6. -He is not on any antipsychotics or antidepressants. Hypothyroidism -He is known to have hypothyroidism since age of 66 yrs currently on a large dose of levothyroxine at 250 g by mouth every morning. He reports family history of thyroid dysfunction in distant cousins. 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 - He fathers 2 grown children. - He complains of progressive weight gain over the years, gained approximately 50 pounds over the last 6 years. -  He has history of coronary artery disease which required angioplasty in 1993 and 2005 - at age 66.  He has significant family history of coronary artery disease.  Review of Systems  Constitutional: + progressive weight gain, + fatigue, + subjective hyperthermia, no subjective hypothermia Eyes: no blurry vision, no xerophthalmia ENT: no sore throat, no nodules palpated in throat, no dysphagia/odynophagia, no hoarseness Cardiovascular: no Chest Pain, no Shortness of Breath, no palpitations, no leg swelling Respiratory: no cough, no SOB Gastrointestinal: no Nausea/Vomiting/Diarhhea Musculoskeletal: no muscle/joint aches Skin: no rashes Neurological: no tremors, no numbness, no tingling, no dizziness Psychiatric: no depression, no  anxiety  Objective:    BP (!) 156/80   Pulse 76   Ht 5\' 10"  (1.778 m)   Wt (!) 311 lb (141.1 kg)   BMI 44.62 kg/m   Wt Readings from Last 3 Encounters:  05/21/17 (!) 311 lb (141.1 kg)  05/12/17 (!) 312 lb (141.5 kg)  02/16/17 (!) 302 lb (137 kg)    Physical Exam  Constitutional: + Obese with a BMI of 44.77, not in acute distress, normal state of mind Eyes: PERRLA, EOMI, no exophthalmos ENT: moist mucous membranes, no thyromegaly, no cervical lymphadenopathy Cardiovascular: normal precordial activity, distant heart sounds due to body habitus, no Murmur/Rubs/Gallops Respiratory:  adequate breathing efforts, no gross chest deformity, Clear to auscultation bilaterally Gastrointestinal: abdomen is obese, soft, Non -tender, +distension, Bowel Sounds present Musculoskeletal: no gross deformities, strength intact in all four extremities Skin: moist, warm, no rashes Neurological: no tremor with outstretched hands, Deep tendon reflexes normal in all four extremities.  Recent Results (from the past 2160 hour(s))  Prolactin     Status: None   Collection Time: 05/12/17  9:52 AM  Result Value Ref Range   Prolactin 8.6 2.0 - 18.0 ng/mL  TSH     Status: None  Collection Time: 05/12/17  9:52 AM  Result Value Ref Range   TSH 1.33 0.40 - 4.50 mIU/L  T4, free     Status: None   Collection Time: 05/12/17  9:52 AM  Result Value Ref Range   Free T4 1.5 0.8 - 1.8 ng/dL  FSH/LH     Status: Abnormal   Collection Time: 05/12/17  9:52 AM  Result Value Ref Range   FSH 8.7 (H) 1.6 - 8.0 mIU/mL   LH 6.1 1.6 - 15.2 mIU/mL  Hemoglobin A1c     Status: Abnormal   Collection Time: 05/12/17  9:52 AM  Result Value Ref Range   Hgb A1c MFr Bld 7.0 (H) <5.7 %    Comment:   For someone without known diabetes, a hemoglobin A1c value of 6.5% or greater indicates that they may have diabetes and this should be confirmed with a follow-up test.   For someone with known diabetes, a value <7% indicates that  their diabetes is well controlled and a value greater than or equal to 7% indicates suboptimal control. A1c targets should be individualized based on duration of diabetes, age, comorbid conditions, and other considerations.   Currently, no consensus exists for use of hemoglobin A1c for diagnosis of diabetes for children.      Mean Plasma Glucose 154 mg/dL    CMP     Component Value Date/Time   NA 139 04/20/2016 1626   K 4.2 04/20/2016 1626   CL 103 04/20/2016 1626   CO2 27 04/20/2016 1626   GLUCOSE 126 (H) 04/20/2016 1626   BUN 15 04/20/2016 1626   CREATININE 1.21 04/20/2016 1626   CALCIUM 9.9 04/20/2016 1626   GFRNONAA >60 04/20/2016 1626   GFRAA >60 04/20/2016 1626    02/26/2017 labs show: LH 5.3, total testosterone 192 TSH 0.66, prolactin 19.1 ( 4-15.2), estradiol 20.1       Assessment & Plan:  1) type 2 diabetes- no diagnosis with A1c of 7% - His diabetes is  complicated by coronary artery disease which required angioplasty, obesity/sedentary life, hypertension which is uncontrolled, hyperlipidemia and patient remains at a high risk for more acute and chronic complications of diabetes which include CAD, CVA, CKD, retinopathy, and neuropathy. These are all discussed in detail with the patient.  Patient came with A1c of 7 %.    - I have re-counseled the patient on diet management and  weight loss  by adopting a carbohydrate restricted / protein rich  Diet.  - Suggestion is made for patient to avoid simple carbohydrates   from his diet including Cakes , Desserts, Ice Cream,  Soda (  diet and regular) , Sweet Tea , Candies,  Chips, Cookies, Artificial Sweeteners,   and "Sugar-free" Products .  This will help patient to have stable blood glucose profile and potentially avoid unintended  Weight gain.  - Patient is advised to stick to a routine mealtimes to eat 3 meals  a day and avoid unnecessary snacks .  - The patient  is  scheduled with Jearld Fenton, RDN, CDE for  individualized DM education.  - I have approached patient with the following individualized plan to manage diabetes and patient agrees.  - I will initiate metformin 500 mg by mouth twice a day. Side effects and precautions discussed with him.  - Patient specific target  for A1c; LDL, HDL, Triglycerides, and  Waist Circumference were discussed in detail.  2) BP/HTN:  Uncontrolled. his blood pressure today is 156/80 unchanged from last visit. -  I have changed his losartan to Losartan /HCTZ 100/25 grams by mouth daily, increase his metoprolol to 50 mg by mouth twice a day, discontinue amlodipine.  3) Lipids/HPL: Recent lipid panel unknown, patient reports intolerance to statins. I have advised him to continue Zetia 10 mg by mouth daily. 4)  Morbid obesity ([place]) -  He is interested in discussion about weight control.  - Heavy weight is  his biggest risk factor for more cardiovascular morbidity and mortality. In light of the fact that he did have premature coronary artery disease at age 54,  and type 2 diabetes he will benefit the most from weight loss.  - I have counseled him on diet management  by adopting a carbohydrate restricted/protein rich diet. - He advised to pick up moderate physical activity by walking 30-60 minutes at least 4 days a week.  -We discussed at length about other choices of weight management including considering  one of the recently approved weight loss medications and weight loss surgery, which would be considered step-by-step.  5. Hyperprolactinemia ([place]) - He has had mild hyperprolactinemia of 19.1,  which has improved to 8.6 on repeat labs. etiology unclear at this time. Patient is not on antipsychotics nor antidepressants. He would not need any intervention at this time. 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 He is not suitable candidate for testosterone replacement. - Suspicion for significant pituitary neoplasm is low at this time,  hence we will defer imaging of pituitary/sella.  6. Hypothyroidism:  - He seems to have well settled diagnosis of hypothyroidism since age 35. The dose of his levothyroxine have been adjusted over the years to current high-dose of 250 g by mouth every morning. -  Aged on his recent thyroid function test, this dose is appropriate for his body habitus.   - We discussed about correct intake of levothyroxine, at fasting, with water, separated by at least 30 minutes from breakfast, and separated by more than 4 hours from calcium, iron, multivitamins, acid reflux medications (PPIs). -Patient is made aware of the fact that thyroid hormone replacement is needed for life, dose to be adjusted by periodic monitoring of thyroid function tests.   7) Chronic Care/Health Maintenance:  -Patient is on ACEI/ARB and not on Statin medications due to intolerance and encouraged to continue to follow up with Ophthalmology, Podiatrist at least yearly or according to recommendations, and advised to  stay away from smoking. I have recommended yearly flu vaccine and pneumonia vaccination at least every 5 years; moderate intensity exercise for up to 150 minutes weekly; and  sleep for at least 7 hours a day.  - 45 minutes of time was spent on the care of this patient , 50% of which was applied for counseling on diabetes complications and their preventions.  - I advised patient to maintain close follow up with Redmond School, MD for primary care needs. Follow up plan: Return in about 4 months (around 09/21/2017) for follow up with pre-visit labs.  Glade Lloyd, MD Phone: [phone]  Fax: [phone]   05/21/2017, 9:40 AM

## 2017-05-27 ENCOUNTER — Ambulatory Visit: Payer: Self-pay | Admitting: Urology

## 2017-06-03 ENCOUNTER — Encounter: Payer: Medicare HMO | Attending: Internal Medicine | Admitting: Nutrition

## 2017-06-03 VITALS — Ht 70.0 in | Wt 303.0 lb

## 2017-06-03 DIAGNOSIS — E119 Type 2 diabetes mellitus without complications: Secondary | ICD-10-CM | POA: Diagnosis not present

## 2017-06-03 DIAGNOSIS — IMO0002 Reserved for concepts with insufficient information to code with codable children: Secondary | ICD-10-CM

## 2017-06-03 DIAGNOSIS — E118 Type 2 diabetes mellitus with unspecified complications: Secondary | ICD-10-CM

## 2017-06-03 DIAGNOSIS — Z713 Dietary counseling and surveillance: Secondary | ICD-10-CM | POA: Diagnosis not present

## 2017-06-03 DIAGNOSIS — E1165 Type 2 diabetes mellitus with hyperglycemia: Secondary | ICD-10-CM

## 2017-06-03 NOTE — Progress Notes (Signed)
Diabetes Self-Management Education  Visit Type: First/Initial  Appt. Start Time: 0800 Appt. End Time: 930  06/03/2017  [name], identified by name and date of birth, is a 66 y.o. male with a diagnosis of Diabetes: Type 2. He lives with his wife. They are remodeling their home and so having to eat out more often than normal. Usually baked and broils food at home. Admits to being an emotional eater and late night eating. Limited physical activity due to back problems and increased shortness of breath from deconditioning.     Just diagnosed with Type 2 DM a few weeks ago. A1C 7%. Had been prediabetic for a few years. Now seeing DR. Dorris Fetch, Endocrinology.  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  ASSESSMENT  Height 5\' 10"  (1.778 m), weight (!) 303 lb (137.4 kg). Body mass index is 43.48 kg/m.      Diabetes Self-Management Education - 06/03/17 0258      Visit Information   Visit Type First/Initial     Initial Visit   Diabetes Type Type 2   Are you currently following a meal plan? Yes   Are you taking your medications as prescribed? Yes   Date Diagnosed 2007     Health Coping   How would you rate your overall health? Good     Psychosocial Assessment   Patient Belief/Attitude about Diabetes Motivated to manage diabetes   Self-care barriers None   Self-management support Family   Other persons present Patient   Patient Concerns Nutrition/Meal planning;Monitoring;Healthy Lifestyle;Medication;Weight Control   Special Needs None   Preferred Learning Style No preference indicated   Learning Readiness Ready   How often do you need to have someone help you when you read instructions, pamphlets, or other written materials from your doctor or pharmacy? 1 - Never   What is the last grade level you completed in school? 12     Pre-Education Assessment   Patient understands the diabetes disease and treatment process. Needs  Instruction   Patient understands incorporating nutritional management into lifestyle. Needs Instruction   Patient undertands incorporating physical activity into lifestyle. Needs Instruction   Patient understands using medications safely. Needs Instruction   Patient understands monitoring blood glucose, interpreting and using results Needs Instruction   Patient understands prevention, detection, and treatment of acute complications. Needs Instruction   Patient understands prevention, detection, and treatment of chronic complications. Needs Instruction   Patient understands how to develop strategies to address psychosocial issues. Needs Instruction   Patient understands how to develop strategies to promote health/change behavior. Needs Instruction     Complications   Last HgB A1C per patient/outside source 7 %   Have you had a dilated eye exam in the past 12 months? Yes   Have you had a dental exam in the past 12 months? Yes   Are you checking your feet? Yes   How many days per week are you checking your feet? 7     Dietary Intake   Breakfast Coffee, boiled egg 2 with mayo and 1/2 slice toast   Lunch Vegetables plate at [employer] or chicken and vegetables or omelets veggie, water or unsweet tea   Dinner [employer] or waffle house, water   Beverage(s) water, unsweet tea     Exercise   Exercise Type ADL's     Patient Education   Previous Diabetes Education No   Disease state  Factors that contribute to the development  of diabetes;Definition of diabetes, type 1 and 2, and the diagnosis of diabetes;Explored patient's options for treatment of their diabetes   Nutrition management  Role of diet in the treatment of diabetes and the relationship between the three main macronutrients and blood glucose level;Carbohydrate counting;Food label reading, portion sizes and measuring food.;Meal timing in regards to the patients' current diabetes medication.;Meal options for control of blood glucose level and chronic  complications.   Physical activity and exercise  Role of exercise on diabetes management, blood pressure control and cardiac health.;Identified with patient nutritional and/or medication changes necessary with exercise.   Medications Reviewed patients medication for diabetes, action, purpose, timing of dose and side effects.   Monitoring Taught/evaluated SMBG meter.;Purpose and frequency of SMBG.;Taught/discussed recording of test results and interpretation of SMBG.;Identified appropriate SMBG and/or A1C goals.   Chronic complications Relationship between chronic complications and blood glucose control;Assessed and discussed foot care and prevention of foot problems;Reviewed with patient heart disease, higher risk of, and prevention   Psychosocial adjustment Worked with patient to identify barriers to care and solutions;Identified and addressed patients feelings and concerns about diabetes   Personal strategies to promote health Lifestyle issues that need to be addressed for better diabetes care;Helped patient develop diabetes management plan for (enter comment)     Individualized Goals (developed by patient)   Nutrition Follow meal plan discussed;General guidelines for healthy choices and portions discussed;Adjust meds/carbs with exercise as discussed   Physical Activity Exercise 3-5 times per week;30 minutes per day   Medications take my medication as prescribed   Monitoring  test my blood glucose as discussed;send in my blood glucose log as discussed   Reducing Risk examine blood glucose patterns;do foot checks daily     Post-Education Assessment   Patient understands the diabetes disease and treatment process. Needs Review   Patient understands incorporating nutritional management into lifestyle. Needs Review   Patient undertands incorporating physical activity into lifestyle. Needs Review   Patient understands using medications safely. Needs Review   Patient understands monitoring blood  glucose, interpreting and using results Needs Review   Patient understands prevention, detection, and treatment of acute complications. Needs Review   Patient understands prevention, detection, and treatment of chronic complications. Needs Review   Patient understands how to develop strategies to address psychosocial issues. Needs Review   Patient understands how to develop strategies to promote health/change behavior. Needs Review     Outcomes   Expected Outcomes Demonstrated interest in learning. Expect positive outcomes   Future DMSE 4-6 wks   Program Status Completed      Individualized Plan for Diabetes Self-Management Training:   Learning Objective:  Patient will have a greater understanding of diabetes self-management. Patient education plan is to attend individual and/or group sessions per assessed needs and concerns.   Plan:   Patient Instructions  Goals 1. Follow My Plate 2. Watch portion sizes 3. Test blood sugars in am or before bed. 4. Eat 45-60 grams per meal Avoid snacks between meals unless protein and veggies. Drink 84 oz of water per day and cut back on coffee Lose 1-2 lbs per week.    Expected Outcomes:  Demonstrated interest in learning. Expect positive outcomes  Education material provided: Living Well with Diabetes, Food label handouts, A1C conversion sheet, Meal plan card, My Plate and Carbohydrate counting sheet  If problems or questions, patient to contact team via:  Phone and Email  Future DSME appointment: 4-6 wks

## 2017-06-03 NOTE — Patient Instructions (Signed)
Goals 1. Follow My Plate 2. Watch portion sizes 3. Test blood sugars in am or before bed. 4. Eat 45-60 grams per meal Avoid snacks between meals unless protein and veggies. Drink 84 oz of water per day and cut back on coffee Lose 1-2 lbs per week.

## 2017-06-05 ENCOUNTER — Other Ambulatory Visit: Payer: Self-pay | Admitting: "Endocrinology

## 2017-06-05 ENCOUNTER — Telehealth: Payer: Self-pay | Admitting: *Deleted

## 2017-06-05 NOTE — Telephone Encounter (Signed)
Called requesting Lancets & test strips Dr Dorris Fetch sent to pharmacy

## 2017-06-19 ENCOUNTER — Other Ambulatory Visit: Payer: Self-pay

## 2017-06-19 DIAGNOSIS — G4733 Obstructive sleep apnea (adult) (pediatric): Secondary | ICD-10-CM | POA: Diagnosis not present

## 2017-06-19 MED ORDER — LOSARTAN POTASSIUM-HCTZ 100-25 MG PO TABS: 1.0000 | ORAL_TABLET | Freq: Every day | ORAL | 3 refills | Status: DC

## 2017-06-19 MED ORDER — METFORMIN HCL 500 MG PO TABS: 500.0000 mg | ORAL_TABLET | Freq: Two times a day (BID) | ORAL | 3 refills | Status: DC

## 2017-06-24 DIAGNOSIS — E063 Autoimmune thyroiditis: Secondary | ICD-10-CM | POA: Diagnosis not present

## 2017-06-24 DIAGNOSIS — Z6826 Body mass index (BMI) 26.0-26.9, adult: Secondary | ICD-10-CM | POA: Diagnosis not present

## 2017-06-24 DIAGNOSIS — Z0001 Encounter for general adult medical examination with abnormal findings: Secondary | ICD-10-CM | POA: Diagnosis not present

## 2017-06-24 DIAGNOSIS — E119 Type 2 diabetes mellitus without complications: Secondary | ICD-10-CM | POA: Diagnosis not present

## 2017-07-01 ENCOUNTER — Other Ambulatory Visit: Payer: Self-pay

## 2017-07-01 DIAGNOSIS — E663 Overweight: Secondary | ICD-10-CM | POA: Diagnosis not present

## 2017-07-01 DIAGNOSIS — Z1389 Encounter for screening for other disorder: Secondary | ICD-10-CM | POA: Diagnosis not present

## 2017-07-01 DIAGNOSIS — Z6826 Body mass index (BMI) 26.0-26.9, adult: Secondary | ICD-10-CM | POA: Diagnosis not present

## 2017-07-01 DIAGNOSIS — E782 Mixed hyperlipidemia: Secondary | ICD-10-CM | POA: Diagnosis not present

## 2017-07-01 DIAGNOSIS — Z91018 Allergy to other foods: Secondary | ICD-10-CM | POA: Diagnosis not present

## 2017-07-01 DIAGNOSIS — I10 Essential (primary) hypertension: Secondary | ICD-10-CM | POA: Diagnosis not present

## 2017-07-01 MED ORDER — METOPROLOL TARTRATE 50 MG PO TABS: 50.0000 mg | ORAL_TABLET | Freq: Two times a day (BID) | ORAL | 0 refills | Status: DC

## 2017-07-13 ENCOUNTER — Ambulatory Visit: Payer: Medicare HMO | Admitting: Nutrition

## 2017-07-15 ENCOUNTER — Telehealth: Payer: Self-pay | Admitting: "Endocrinology

## 2017-07-15 NOTE — Telephone Encounter (Signed)
Called pt no answer °

## 2017-07-15 NOTE — Telephone Encounter (Signed)
It is unlikely for metformin to cause hot flushes- I suggest for him to continue.  if he is losing weight, he may need adjustment in his thyroid hormone.  Low testosterone also causes hot flushes.

## 2017-07-15 NOTE — Telephone Encounter (Signed)
Started Metformin over 2 wks ago now having bad hot flashes,  Symptoms x's 1 wk, hes asking could this be a side effect of the Metformin, please advise?

## 2017-07-16 NOTE — Telephone Encounter (Signed)
Called pt. No answer °

## 2017-07-20 DIAGNOSIS — G4733 Obstructive sleep apnea (adult) (pediatric): Secondary | ICD-10-CM | POA: Diagnosis not present

## 2017-07-21 NOTE — Telephone Encounter (Signed)
Awaiting return call for pt

## 2017-07-23 DIAGNOSIS — G4733 Obstructive sleep apnea (adult) (pediatric): Secondary | ICD-10-CM | POA: Diagnosis not present

## 2017-07-29 ENCOUNTER — Other Ambulatory Visit: Payer: Self-pay

## 2017-07-29 MED ORDER — METOPROLOL TARTRATE 50 MG PO TABS: 50.0000 mg | ORAL_TABLET | Freq: Two times a day (BID) | ORAL | 0 refills | Status: DC

## 2017-08-13 DIAGNOSIS — R5383 Other fatigue: Secondary | ICD-10-CM | POA: Diagnosis not present

## 2017-08-13 DIAGNOSIS — R972 Elevated prostate specific antigen [PSA]: Secondary | ICD-10-CM | POA: Diagnosis not present

## 2017-08-19 ENCOUNTER — Ambulatory Visit (INDEPENDENT_AMBULATORY_CARE_PROVIDER_SITE_OTHER): Payer: Medicare HMO | Admitting: Urology

## 2017-08-19 DIAGNOSIS — R351 Nocturia: Secondary | ICD-10-CM

## 2017-08-19 DIAGNOSIS — N401 Enlarged prostate with lower urinary tract symptoms: Secondary | ICD-10-CM

## 2017-08-19 DIAGNOSIS — G4733 Obstructive sleep apnea (adult) (pediatric): Secondary | ICD-10-CM | POA: Diagnosis not present

## 2017-08-19 DIAGNOSIS — R972 Elevated prostate specific antigen [PSA]: Secondary | ICD-10-CM | POA: Diagnosis not present

## 2017-08-26 DIAGNOSIS — Z23 Encounter for immunization: Secondary | ICD-10-CM | POA: Diagnosis not present

## 2017-09-14 DIAGNOSIS — E221 Hyperprolactinemia: Secondary | ICD-10-CM | POA: Diagnosis not present

## 2017-09-14 DIAGNOSIS — E1159 Type 2 diabetes mellitus with other circulatory complications: Secondary | ICD-10-CM | POA: Diagnosis not present

## 2017-09-14 DIAGNOSIS — E039 Hypothyroidism, unspecified: Secondary | ICD-10-CM | POA: Diagnosis not present

## 2017-09-14 DIAGNOSIS — E782 Mixed hyperlipidemia: Secondary | ICD-10-CM | POA: Diagnosis not present

## 2017-09-15 LAB — RENAL FUNCTION PANEL
Albumin: 4.4 g/dL (ref 3.6–5.1)
BUN/Creatinine Ratio: 18 (calc) (ref 6–22)
BUN: 24 mg/dL (ref 7–25)
CHLORIDE: 102 mmol/L (ref 98–110)
CO2: 24 mmol/L (ref 20–32)
CREATININE: 1.31 mg/dL — AB (ref 0.70–1.25)
Calcium: 9.4 mg/dL (ref 8.6–10.3)
GLUCOSE: 125 mg/dL — AB (ref 65–99)
POTASSIUM: 4.4 mmol/L (ref 3.5–5.3)
Phosphorus: 2.7 mg/dL (ref 2.1–4.3)
Sodium: 138 mmol/L (ref 135–146)

## 2017-09-15 LAB — LIPID PANEL
CHOL/HDL RATIO: 4 (calc) (ref <5.0)
Cholesterol: 162 mg/dL (ref <200)
HDL: 41 mg/dL (ref >40)
LDL CHOLESTEROL (CALC): 79 mg/dL
Non-HDL Cholesterol (Calc): 121 mg/dL (calc) (ref <130)
TRIGLYCERIDES: 346 mg/dL — AB (ref <150)

## 2017-09-15 LAB — T4, FREE: FREE T4: 1.7 ng/dL (ref 0.8–1.8)

## 2017-09-15 LAB — TSH: TSH: 0.02 mIU/L — ABNORMAL LOW (ref 0.40–4.50)

## 2017-09-15 LAB — HEMOGLOBIN A1C
HEMOGLOBIN A1C: 5.9 %{Hb} — AB (ref <5.7)
MEAN PLASMA GLUCOSE: 123 (calc)
eAG (mmol/L): 6.8 (calc)

## 2017-09-15 LAB — PROLACTIN: PROLACTIN: 9.2 ng/mL (ref 2.0–18.0)

## 2017-09-15 LAB — VITAMIN D 25 HYDROXY (VIT D DEFICIENCY, FRACTURES): VIT D 25 HYDROXY: 19 ng/mL — AB (ref 30–100)

## 2017-09-19 DIAGNOSIS — G4733 Obstructive sleep apnea (adult) (pediatric): Secondary | ICD-10-CM | POA: Diagnosis not present

## 2017-09-22 ENCOUNTER — Ambulatory Visit: Payer: Medicare HMO | Admitting: "Endocrinology

## 2017-09-22 ENCOUNTER — Ambulatory Visit: Payer: Medicare HMO | Admitting: Nutrition

## 2017-09-23 DIAGNOSIS — R809 Proteinuria, unspecified: Secondary | ICD-10-CM | POA: Diagnosis not present

## 2017-09-23 DIAGNOSIS — N183 Chronic kidney disease, stage 3 (moderate): Secondary | ICD-10-CM | POA: Diagnosis not present

## 2017-09-23 DIAGNOSIS — E1129 Type 2 diabetes mellitus with other diabetic kidney complication: Secondary | ICD-10-CM | POA: Diagnosis not present

## 2017-09-23 DIAGNOSIS — I10 Essential (primary) hypertension: Secondary | ICD-10-CM | POA: Diagnosis not present

## 2017-09-30 ENCOUNTER — Ambulatory Visit (INDEPENDENT_AMBULATORY_CARE_PROVIDER_SITE_OTHER): Payer: Medicare HMO | Admitting: "Endocrinology

## 2017-09-30 ENCOUNTER — Encounter: Payer: Self-pay | Admitting: "Endocrinology

## 2017-09-30 VITALS — BP 146/88 | HR 61 | Ht 70.0 in | Wt 279.0 lb

## 2017-09-30 DIAGNOSIS — E221 Hyperprolactinemia: Secondary | ICD-10-CM | POA: Diagnosis not present

## 2017-09-30 DIAGNOSIS — I10 Essential (primary) hypertension: Secondary | ICD-10-CM

## 2017-09-30 DIAGNOSIS — E782 Mixed hyperlipidemia: Secondary | ICD-10-CM

## 2017-09-30 DIAGNOSIS — E1159 Type 2 diabetes mellitus with other circulatory complications: Secondary | ICD-10-CM | POA: Diagnosis not present

## 2017-09-30 DIAGNOSIS — E039 Hypothyroidism, unspecified: Secondary | ICD-10-CM

## 2017-09-30 DIAGNOSIS — E119 Type 2 diabetes mellitus without complications: Secondary | ICD-10-CM | POA: Diagnosis not present

## 2017-09-30 MED ORDER — VITAMIN D3 125 MCG (5000 UT) PO CAPS: 5000.0000 [IU] | ORAL_CAPSULE | Freq: Every day | ORAL | 0 refills | Status: DC

## 2017-09-30 NOTE — Patient Instructions (Signed)

## 2017-09-30 NOTE — Progress Notes (Signed)
Subjective:    Patient ID: Brandon Warren, male    DOB: Dec 19, 1950, PCP Redmond School, MD   Past Medical History:  Diagnosis Date  . Arthritis   . Hypercholesterolemia   . Hypertension   . Hypothyroidism   . MI (myocardial infarction) ([place])    in his 51s   Past Surgical History:  Procedure Laterality Date  . APPENDECTOMY    . CATARACT EXTRACTION Left   . COLONOSCOPY N/A 02/01/2014   Procedure: COLONOSCOPY;  Surgeon: Daneil Dolin, MD;  Location: [facility];  Service: Endoscopy;  Laterality: N/A;  8:30  . CORONARY ANGIOPLASTY WITH STENT PLACEMENT     1 stent   Social History   Socioeconomic History  . Marital status: Married    Spouse name: None  . Number of children: None  . Years of education: None  . Highest education level: None  Social Needs  . Financial resource strain: None  . Food insecurity - worry: None  . Food insecurity - inability: None  . Transportation needs - medical: None  . Transportation needs - non-medical: None  Occupational History  . Occupation: self-employed  Tobacco Use  . Smoking status: Former Smoker    Packs/day: 2.00    Years: 30.00    Pack years: 60.00    Types: Cigarettes    Last attempt to quit: 02/02/2004    Years since quitting: 13.6  . Smokeless tobacco: Never Used  Substance and Sexual Activity  . Alcohol use: Yes    Comment: occasional  . Drug use: No  . Sexual activity: Yes    Birth control/protection: None  Other Topics Concern  . None  Social History Narrative  . None   Outpatient Encounter Medications as of 09/30/2017  Medication Sig  . alfuzosin (UROXATRAL) 10 MG 24 hr tablet Take 10 mg by mouth 2 (two) times daily.  Marland Kitchen aspirin EC 81 MG tablet Take 81 mg by mouth every evening. Reported on 12/18/2015  . Cholecalciferol (VITAMIN D3) 5000 units CAPS Take 1 capsule (5,000 Units total) by mouth daily.  . diazepam (VALIUM) 10 MG tablet Take 10 mg by mouth every 6 (six) hours as needed for anxiety.  Marland Kitchen ezetimibe  (ZETIA) 10 MG tablet Take 10 mg by mouth at bedtime.   . finasteride (PROSCAR) 5 MG tablet Take 10 mg by mouth daily.  Marland Kitchen levothyroxine (SYNTHROID, LEVOTHROID) 200 MCG tablet Take 250 mcg by mouth daily before breakfast.   . losartan (COZAAR) 100 MG tablet Take 100 mg by mouth every evening.   Marland Kitchen losartan-hydrochlorothiazide (HYZAAR) 100-25 MG tablet Take 1 tablet by mouth daily.  . metFORMIN (GLUCOPHAGE) 500 MG tablet Take 1 tablet (500 mg total) by mouth 2 (two) times daily with a meal.  . metoprolol tartrate (LOPRESSOR) 50 MG tablet Take 1 tablet (50 mg total) by mouth 2 (two) times daily.   No facility-administered encounter medications on file as of 09/30/2017.    ALLERGIES: Allergies  Allergen Reactions  . Meat Extract Diarrhea, Nausea Only and Other (See Comments)    alphagal reaction  . Lipitor [Atorvastatin] Other (See Comments)    Muscle cramps  . Simvastatin Other (See Comments)    Muscle cramps     VACCINATION STATUS:  There is no immunization history on file for this patient.  Diabetes  He presents for his follow-up diabetic visit. He has type 2 diabetes mellitus. Onset time: Being diagnosed at age 42 years. His disease course has been improving. There  are no hypoglycemic associated symptoms. There are no diabetic associated symptoms. There are no hypoglycemic complications. Symptoms are improving. Diabetic complications include heart disease. Risk factors for coronary artery disease include diabetes mellitus, dyslipidemia, hypertension, obesity, sedentary lifestyle and tobacco exposure. When asked about current treatments, none were reported. His weight is decreasing steadily. He is following a generally unhealthy diet. When asked about meal planning, he reported none. He has not had a previous visit with a dietitian. He rarely participates in exercise. An ACE inhibitor/angiotensin II receptor blocker is being taken.  Hypertension  This is a chronic problem. The current  episode started more than 1 year ago. The problem is uncontrolled. Risk factors for coronary artery disease include diabetes mellitus, dyslipidemia, obesity, sedentary lifestyle, smoking/tobacco exposure and family history. Past treatments include angiotensin blockers and diuretics. Hypertensive end-organ damage includes CAD/MI.  Hyperlipidemia  This is a chronic problem. The current episode started more than 1 year ago. The problem is uncontrolled. Exacerbating diseases include diabetes, hypothyroidism and obesity. Risk factors for coronary artery disease include diabetes mellitus, dyslipidemia, hypertension, male sex, obesity and a sedentary lifestyle.   Hyperprolactinemia -He denies any prior history of pituitary, adrenal dysfunction. - He was found to have high prolactin level of 19.1 (normal 4-15.2) on 02/26/2017, his repeat labs show normal prolactin of 8.6. - His most recent labs showed stable prolactin of 9.2 from 09/14/2017. -He is not on any antipsychotics or antidepressants.  Hypothyroidism -He is known to have hypothyroidism since age of 79 yrs currently on a large dose of levothyroxine at 250 g by mouth every morning. He reports family history of thyroid dysfunction in distant cousins. 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 - He fathers 2 grown children. - He went on ketogenic diet and lost 32 pounds since last visit, after he gained  approximately 50 pounds over the previous 6 years. -  He has history of coronary artery disease which required angioplasty in 1993 and 2005 - at age 66.  He has significant family history of coronary artery disease.  Review of Systems  Constitutional: + Rapid  weight loss of 32 pounds recently,  + fatigue, + subjective hyperthermia, no subjective hypothermia Eyes: no blurry vision, no xerophthalmia ENT: no sore throat, no nodules palpated in throat, no  dysphagia/odynophagia, no hoarseness Cardiovascular: no Chest Pain, no Shortness of Breath, no palpitations, no leg swelling Respiratory: no cough, no SOB Gastrointestinal: no Nausea/Vomiting/Diarhhea Musculoskeletal: no muscle/joint aches Skin: no rashes Neurological: no tremors, no numbness, no tingling, no dizziness Psychiatric: no depression, no anxiety  Objective:    BP (!) 146/88   Pulse 61   Ht 5\' 10"  (1.778 m)   Wt 279 lb (126.6 kg)   BMI 40.03 kg/m   Wt Readings from Last 3 Encounters:  09/30/17 279 lb (126.6 kg)  06/03/17 (!) 303 lb (137.4 kg)  05/21/17 (!) 311 lb (141.1 kg)    Physical Exam  Constitutional: + Obese with a BMI of 40,  not in acute distress, normal state of mind Eyes: PERRLA, EOMI, no exophthalmos ENT: moist mucous membranes, no thyromegaly, no cervical lymphadenopathy Cardiovascular: normal precordial activity, distant heart sounds due to body habitus, no Murmur/Rubs/Gallops Respiratory:  adequate breathing efforts, no gross chest deformity, Clear to auscultation bilaterally Gastrointestinal: abdomen is obese, soft, Non -tender, +distension, Bowel Sounds present Musculoskeletal: no gross deformities, strength intact in all four extremities Skin: moist, warm, no  rashes Neurological: no tremor with outstretched hands, Deep tendon reflexes normal in all four extremities.  Recent Results (from the past 2160 hour(s))  Renal function panel     Status: Abnormal   Collection Time: 09/14/17  9:18 AM  Result Value Ref Range   Glucose, Bld 125 (H) 65 - 99 mg/dL    Comment: .            Fasting reference interval . For someone without known diabetes, a glucose value between 100 and 125 mg/dL is consistent with prediabetes and should be confirmed with a follow-up test. .    BUN 24 7 - 25 mg/dL   Creat 1.31 (H) 0.70 - 1.25 mg/dL    Comment: For patients >41 years of age, the reference limit for Creatinine is approximately 13% higher for  people identified as African-American. .    BUN/Creatinine Ratio 18 6 - 22 (calc)   Sodium 138 135 - 146 mmol/L   Potassium 4.4 3.5 - 5.3 mmol/L   Chloride 102 98 - 110 mmol/L   CO2 24 20 - 32 mmol/L   Calcium 9.4 8.6 - 10.3 mg/dL   Phosphorus 2.7 2.1 - 4.3 mg/dL   Albumin 4.4 3.6 - 5.1 g/dL  Hemoglobin A1c     Status: Abnormal   Collection Time: 09/14/17  9:18 AM  Result Value Ref Range   Hgb A1c MFr Bld 5.9 (H) <5.7 % of total Hgb    Comment: For someone without known diabetes, a hemoglobin  A1c value between 5.7% and 6.4% is consistent with prediabetes and should be confirmed with a  follow-up test. . For someone with known diabetes, a value <7% indicates that their diabetes is well controlled. A1c targets should be individualized based on duration of diabetes, age, comorbid conditions, and other considerations. . This assay result is consistent with an increased risk of diabetes. . Currently, no consensus exists regarding use of hemoglobin A1c for diagnosis of diabetes for children. .    Mean Plasma Glucose 123 (calc)   eAG (mmol/L) 6.8 (calc)  TSH     Status: Abnormal   Collection Time: 09/14/17  9:18 AM  Result Value Ref Range   TSH 0.02 (L) 0.40 - 4.50 mIU/L  T4, free     Status: None   Collection Time: 09/14/17  9:18 AM  Result Value Ref Range   Free T4 1.7 0.8 - 1.8 ng/dL  Lipid panel     Status: Abnormal   Collection Time: 09/14/17  9:18 AM  Result Value Ref Range   Cholesterol 162 <200 mg/dL   HDL 41 >40 mg/dL   Triglycerides 346 (H) <150 mg/dL   LDL Cholesterol (Calc) 79 mg/dL (calc)    Comment: Reference range: <100 . Desirable range <100 mg/dL for primary prevention;   <70 mg/dL for patients with CHD or diabetic patients  with > or = 2 CHD risk factors. Marland Kitchen LDL-C is now calculated using the Martin-Hopkins  calculation, which is a validated novel method providing  better accuracy than the Friedewald equation in the  estimation of LDL-C.  Cresenciano Genre et al. Annamaria Helling. 3500;938(18): 2061-2068  (http://education.[facility].com/faq/FAQ164)    Total CHOL/HDL Ratio 4.0 <5.0 (calc)   Non-HDL Cholesterol (Calc) 121 <130 mg/dL (calc)    Comment: For patients with diabetes plus 1 major ASCVD risk  factor, treating to a non-HDL-C goal of <100 mg/dL  (LDL-C of <70 mg/dL) is considered a therapeutic  option.   Prolactin     Status: None  Collection Time: 09/14/17  9:18 AM  Result Value Ref Range   Prolactin 9.2 2.0 - 18.0 ng/mL  VITAMIN D 25 Hydroxy (Vit-D Deficiency, Fractures)     Status: Abnormal   Collection Time: 09/14/17  9:18 AM  Result Value Ref Range   Vit D, 25-Hydroxy 19 (L) 30 - 100 ng/mL    Comment: Vitamin D Status         25-OH Vitamin D: . Deficiency:                    <20 ng/mL Insufficiency:             20 - 29 ng/mL Optimal:                 > or = 30 ng/mL . For 25-OH Vitamin D testing on patients on  D2-supplementation and patients for whom quantitation  of D2 and D3 fractions is required, the [facility]ureD(TM) 25-OH VIT D, (D2,D3), LC/MS/MS is recommended: order  code [phone] (patients >80yrs). . For more information on this test, go to: http://education.[facility].com/faq/FAQ163 (This link is being provided for  informational/educational purposes only.)     CMP     Component Value Date/Time   NA 138 09/14/2017 0918   K 4.4 09/14/2017 0918   CL 102 09/14/2017 0918   CO2 24 09/14/2017 0918   GLUCOSE 125 (H) 09/14/2017 0918   BUN 24 09/14/2017 0918   CREATININE 1.31 (H) 09/14/2017 0918   CALCIUM 9.4 09/14/2017 0918   GFRNONAA >60 04/20/2016 1626   GFRAA >60 04/20/2016 1626    02/26/2017 labs show: LH 5.3, total testosterone 192 TSH 0.66, prolactin 19.1 ( 4-15.2), estradiol 20.1       Assessment & Plan:  1) type 2 diabetes. - He has improved his A1c to 5.9% from 7%. - His diabetes is  complicated by coronary artery disease which required angioplasty, obesity/sedentary life, hypertension  which is uncontrolled, hyperlipidemia and patient remains at a high risk for more acute and chronic complications of diabetes which include CAD, CVA, CKD, retinopathy, and neuropathy. These are all discussed in detail with the patient.  - I have re-counseled the patient on diet management and  weight loss  by adopting a carbohydrate restricted / protein rich  Diet.  -  Suggestion is made for him to avoid simple carbohydrates  from his diet including Cakes, Sweet Desserts / Pastries, Ice Cream, Soda (diet and regular), Sweet Tea, Candies, Chips, Cookies, Store Bought Juices, Alcohol in Excess of  1-2 drinks a day, Artificial Sweeteners, and "Sugar-free" Products. This will help patient to have stable blood glucose profile and potentially avoid unintended weight gain.   - Patient is advised to stick to a routine mealtimes to eat 3 meals  a day and avoid unnecessary snacks .  - I have approached patient with the following individualized plan to manage diabetes and patient agrees.  - I advised him to stay on  metformin 500 mg by mouth twice a day. Side effects and precautions discussed with him.  - Patient specific target  for A1c; LDL, HDL, Triglycerides, and  Waist Circumference were discussed in detail.  2) BP/HTN:  Uncontrolled. His blood pressure today is 146/88, improving from last visit.  - I advised him to continue Losartan /HCTZ 100/25 grams by mouth daily, increase his metoprolol to 50 mg by mouth twice a day.  3) Lipids/HPL: Recent lipid panel shows significant hypertriglyceridemia of 346 increasing from 106. This could be due to  his recent engagement with ketogenic diet. - I have advised him to continue Zetia 10 mg by mouth daily.  4)  Morbid obesity ([place]) -  He  has achieved rather rapid weight loss of 32 pounds, there is risk of rebound weight gain since this has happened relatively too fast.  - Heavy weight is still   his biggest risk factor for more cardiovascular morbidity and  mortality. In light of the fact that he did have premature coronary artery disease at age 43,  and type 2 diabetes he will benefit the most from weight loss.  - I have counseled him on diet management  by adopting a carbohydrate restricted/protein rich diet. - He advised to pick up moderate physical activity by walking 30-60 minutes at least 4 days a week. - I advised him he would benefit more from more balanced intake of micronutrients more so than from ketogenic diet. However he seemed to be more focused on ketogenic diet for now and does not want to change.   5. Hyperprolactinemia ([place]) -  's repeat labs show prolactin within normal range at 19.2, progressively improving from 19.1.  etiology unclear at this time. Patient is not on antipsychotics nor antidepressants. He will not need any intervention at this time. 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 He is not suitable candidate for testosterone replacement. - Suspicion for significant pituitary neoplasm is low at this time, hence we will defer imaging of pituitary/sella.  6. Hypothyroidism:  - He seems to have well settled diagnosis of hypothyroidism since age 13.  -  Based  on his recent thyroid function test, the current doses of levothyroxine is too high.  - I approached him for lower dose of levothyroxine 225 micrograms by mouth every morning.    - We discussed about correct intake of levothyroxine, at fasting, with water, separated by at least 30 minutes from breakfast, and separated by more than 4 hours from calcium, iron, multivitamins, acid reflux medications (PPIs). -Patient is made aware of the fact that thyroid hormone replacement is needed for life, dose to be adjusted by periodic monitoring of thyroid function tests.  7) vitamin D deficiency: New diagnosis for him - I discussed and initiated vitamin D 3 5000 units daily for the next 90 days.   8) Chronic Care/Health Maintenance:  -Patient  is on ACEI/ARB and not on Statin medications due to intolerance and encouraged to continue to follow up with Ophthalmology, Podiatrist at least yearly or according to recommendations, and advised to  stay away from smoking. I have recommended yearly flu vaccine and pneumonia vaccination at least every 5 years; moderate intensity exercise for up to 150 minutes weekly; and  sleep for at least 7 hours a day.   - I advised patient to maintain close follow up with Redmond School, MD for primary care needs. Follow up plan: Return in about 6 months (around 03/31/2018) for follow up with pre-visit labs.  Glade Lloyd, MD Phone: [phone]  Fax: [phone]  -  This note was partially dictated with voice recognition software. Similar sounding words can be transcribed inadequately or may not  be corrected upon review.  09/30/2017, 1:42 PM

## 2017-10-06 ENCOUNTER — Other Ambulatory Visit (HOSPITAL_COMMUNITY): Payer: Self-pay | Admitting: Nephrology

## 2017-10-06 DIAGNOSIS — N183 Chronic kidney disease, stage 3 unspecified: Secondary | ICD-10-CM

## 2017-10-13 ENCOUNTER — Other Ambulatory Visit: Payer: Self-pay | Admitting: "Endocrinology

## 2017-10-14 ENCOUNTER — Other Ambulatory Visit: Payer: Self-pay

## 2017-10-14 MED ORDER — LEVOTHYROXINE SODIUM 200 MCG PO TABS: 200.0000 ug | ORAL_TABLET | Freq: Every day | ORAL | 0 refills | Status: DC

## 2017-10-15 ENCOUNTER — Other Ambulatory Visit: Payer: Self-pay | Admitting: "Endocrinology

## 2017-10-16 ENCOUNTER — Ambulatory Visit (HOSPITAL_COMMUNITY): Payer: Medicare HMO

## 2017-10-19 DIAGNOSIS — G4733 Obstructive sleep apnea (adult) (pediatric): Secondary | ICD-10-CM | POA: Diagnosis not present

## 2017-10-26 ENCOUNTER — Other Ambulatory Visit: Payer: Self-pay

## 2017-10-26 MED ORDER — LEVOTHYROXINE SODIUM 25 MCG PO TABS: 25.0000 ug | ORAL_TABLET | Freq: Every day | ORAL | 0 refills | Status: DC

## 2017-10-30 ENCOUNTER — Ambulatory Visit (HOSPITAL_COMMUNITY): Admission: RE | Admit: 2017-10-30 | Payer: Medicare HMO | Source: Ambulatory Visit

## 2017-11-04 DIAGNOSIS — R809 Proteinuria, unspecified: Secondary | ICD-10-CM | POA: Diagnosis not present

## 2017-11-04 DIAGNOSIS — I10 Essential (primary) hypertension: Secondary | ICD-10-CM | POA: Diagnosis not present

## 2017-11-04 DIAGNOSIS — E559 Vitamin D deficiency, unspecified: Secondary | ICD-10-CM | POA: Diagnosis not present

## 2017-11-04 DIAGNOSIS — Z79899 Other long term (current) drug therapy: Secondary | ICD-10-CM | POA: Diagnosis not present

## 2017-11-04 DIAGNOSIS — D649 Anemia, unspecified: Secondary | ICD-10-CM | POA: Diagnosis not present

## 2017-11-04 DIAGNOSIS — Z1159 Encounter for screening for other viral diseases: Secondary | ICD-10-CM | POA: Diagnosis not present

## 2017-11-05 ENCOUNTER — Ambulatory Visit (HOSPITAL_COMMUNITY)
Admission: RE | Admit: 2017-11-05 | Discharge: 2017-11-05 | Disposition: A | Discharge status: Home / Self Care | Payer: Medicare HMO | Source: Ambulatory Visit | Attending: Nephrology | Admitting: Nephrology

## 2017-11-05 DIAGNOSIS — N183 Chronic kidney disease, stage 3 unspecified: Secondary | ICD-10-CM

## 2017-11-16 DIAGNOSIS — I509 Heart failure, unspecified: Secondary | ICD-10-CM | POA: Diagnosis not present

## 2017-11-16 DIAGNOSIS — E669 Obesity, unspecified: Secondary | ICD-10-CM | POA: Diagnosis not present

## 2017-11-16 DIAGNOSIS — N183 Chronic kidney disease, stage 3 (moderate): Secondary | ICD-10-CM | POA: Diagnosis not present

## 2017-11-19 DIAGNOSIS — G4733 Obstructive sleep apnea (adult) (pediatric): Secondary | ICD-10-CM | POA: Diagnosis not present

## 2017-12-20 DIAGNOSIS — G4733 Obstructive sleep apnea (adult) (pediatric): Secondary | ICD-10-CM | POA: Diagnosis not present

## 2017-12-30 ENCOUNTER — Other Ambulatory Visit: Payer: Self-pay | Admitting: "Endocrinology

## 2018-01-11 ENCOUNTER — Other Ambulatory Visit: Payer: Self-pay | Admitting: "Endocrinology

## 2018-01-15 DIAGNOSIS — M546 Pain in thoracic spine: Secondary | ICD-10-CM | POA: Diagnosis not present

## 2018-01-15 DIAGNOSIS — M9902 Segmental and somatic dysfunction of thoracic region: Secondary | ICD-10-CM | POA: Diagnosis not present

## 2018-01-15 DIAGNOSIS — M9901 Segmental and somatic dysfunction of cervical region: Secondary | ICD-10-CM | POA: Diagnosis not present

## 2018-01-15 DIAGNOSIS — M542 Cervicalgia: Secondary | ICD-10-CM | POA: Diagnosis not present

## 2018-01-17 DIAGNOSIS — G4733 Obstructive sleep apnea (adult) (pediatric): Secondary | ICD-10-CM | POA: Diagnosis not present

## 2018-01-23 ENCOUNTER — Other Ambulatory Visit: Payer: Self-pay | Admitting: "Endocrinology

## 2018-01-28 ENCOUNTER — Other Ambulatory Visit: Payer: Self-pay | Admitting: "Endocrinology

## 2018-02-12 DIAGNOSIS — E559 Vitamin D deficiency, unspecified: Secondary | ICD-10-CM | POA: Diagnosis not present

## 2018-02-12 DIAGNOSIS — Z79899 Other long term (current) drug therapy: Secondary | ICD-10-CM | POA: Diagnosis not present

## 2018-02-12 DIAGNOSIS — I10 Essential (primary) hypertension: Secondary | ICD-10-CM | POA: Diagnosis not present

## 2018-02-12 DIAGNOSIS — D509 Iron deficiency anemia, unspecified: Secondary | ICD-10-CM | POA: Diagnosis not present

## 2018-02-12 DIAGNOSIS — N183 Chronic kidney disease, stage 3 (moderate): Secondary | ICD-10-CM | POA: Diagnosis not present

## 2018-02-17 DIAGNOSIS — N182 Chronic kidney disease, stage 2 (mild): Secondary | ICD-10-CM | POA: Diagnosis not present

## 2018-02-17 DIAGNOSIS — R809 Proteinuria, unspecified: Secondary | ICD-10-CM | POA: Diagnosis not present

## 2018-02-17 DIAGNOSIS — E669 Obesity, unspecified: Secondary | ICD-10-CM | POA: Diagnosis not present

## 2018-02-17 DIAGNOSIS — G4733 Obstructive sleep apnea (adult) (pediatric): Secondary | ICD-10-CM | POA: Diagnosis not present

## 2018-02-17 DIAGNOSIS — M25512 Pain in left shoulder: Secondary | ICD-10-CM | POA: Diagnosis not present

## 2018-03-09 ENCOUNTER — Ambulatory Visit: Payer: Medicare HMO | Admitting: Cardiovascular Disease

## 2018-03-11 ENCOUNTER — Telehealth: Payer: Self-pay | Admitting: Cardiovascular Disease

## 2018-03-11 ENCOUNTER — Encounter: Payer: Self-pay | Admitting: Cardiovascular Disease

## 2018-03-11 ENCOUNTER — Ambulatory Visit: Payer: Medicare HMO | Admitting: Cardiovascular Disease

## 2018-03-11 VITALS — BP 144/80 | HR 82 | Ht 70.0 in | Wt 298.0 lb

## 2018-03-11 DIAGNOSIS — I10 Essential (primary) hypertension: Secondary | ICD-10-CM | POA: Diagnosis not present

## 2018-03-11 DIAGNOSIS — E785 Hyperlipidemia, unspecified: Secondary | ICD-10-CM | POA: Diagnosis not present

## 2018-03-11 DIAGNOSIS — I25118 Atherosclerotic heart disease of native coronary artery with other forms of angina pectoris: Secondary | ICD-10-CM | POA: Diagnosis not present

## 2018-03-11 DIAGNOSIS — R0609 Other forms of dyspnea: Secondary | ICD-10-CM | POA: Diagnosis not present

## 2018-03-11 MED ORDER — AMLODIPINE BESYLATE 5 MG PO TABS: 5.0000 mg | ORAL_TABLET | Freq: Every day | ORAL | 0 refills | Status: DC

## 2018-03-11 NOTE — Patient Instructions (Signed)
Medication Instructions:  Your physician has recommended you make the following change in your medication:   START Amlodipine 5 mg daily  Please continue all other medications as prescribed  Labwork: NONE  Testing/Procedures:   [place] [address] Dept: [phone] Loc: [place]  03/11/2018  You are scheduled for a Cardiac Catheterization on Monday, May 20 with Dr. [name].  1. Please arrive at the [facility] (Main Entrance A) at [facility]: [address] [place], [place] [address] at 8:00 AM (two hours before your procedure to ensure your preparation). Free valet parking service is available.   Special note: Every effort is made to have your procedure done on time. Please understand that emergencies sometimes delay scheduled procedures.  2. Diet: Do not eat or drink anything after midnight prior to your procedure except sips of water to take medications.  3. Labs: Your labs will be performed at the hospital after you arrive for your procedure.  4. Medication instructions in preparation for your procedure:  METFORMIN SHOULD BE HELD 24 HOURS PRIOR TO PROCEDURE AND 48 HOURS AFTER PROCEDURE   On the morning of your procedure, take your Aspirin and any morning medicines NOT listed above.  You may use sips of water.  5. Plan for one night stay--bring personal belongings. 6. Bring a current list of your medications and current insurance cards. 7. You MUST have a responsible person to drive you home. 8. Someone MUST be with you the first 24 hours after you arrive home or your discharge will be delayed. 9. Please wear clothes that are easy to get on and off and wear slip-on shoes.  Thank you for allowing [place] to care for you!   -- [facility]  Follow-Up: Your physician recommends that  you schedule a follow-up appointment in: 1-2 MONTHS  Any Other Special Instructions Will Be Listed Below (If Applicable).  If you need a refill on your cardiac medications before your next appointment, please call your pharmacy.

## 2018-03-11 NOTE — H&P (View-Only) (Signed)
SUBJECTIVE: The patient presents for routine follow-up. He has a history of coronary artery disease with previous stenting of the obtuse marginal branch in 2005 with a Taxus drug-eluting stent. A follow-up catheterization in 2006 as part of a research trial showed that his stents were patent.  His son tried to commit suicide in 2017 and the patient has experienced depression since that time.  Echocardiogram 02/19/2017: Normal left ventricular systolic function and regional wall motion, LVEF 60 to 65%, mild LVH, grade 1 diastolic dysfunction.  He is here with his wife, Hildred Priest.  For the past few months he has been experience and progressive exertional dyspnea when walking from one side of the house to the other.  He went to a concert at [facility] 2 nights ago and had to climb 3 flights of stairs.  After coming to flights he had to stop due to shortness of breath and palpitations.  He also describes an occasional "brain fog".  He denies exertional chest pain.  He initially went on the Atkins diet and lost 40 pounds.  He then developed kidney disease and was told to stop this diet due to it being a high protein diet.  Since then he is put back on the weight.  His blood pressure had been staying in the 120/70-80 range but now stays in the mid 140/80 range consistently.  He was at [employer] 3 weeks ago with his family.  He developed back pain and left shoulder pain.  His daughter was concerned he might be experience in a cardiac event and encouraged him to go to the hospital.  He did not.  When he came back home to [place], he saw his orthopedic surgeon who diagnosed left rotator cuff tendinitis and some trigger points in his back.  ECG performed today which I personally reviewed demonstrates normal sinus rhythm with baseline artifact.   Soc Hx: His daughter is a Music therapist.    Review of Systems: As per "subjective", otherwise negative.  Allergies  Allergen Reactions  . Meat  Extract Diarrhea, Nausea Only and Other (See Comments)    alphagal reaction  . Lipitor [Atorvastatin] Other (See Comments)    Muscle cramps  . Simvastatin Other (See Comments)    Muscle cramps     Current Outpatient Medications  Medication Sig Dispense Refill  . aspirin EC 81 MG tablet Take 81 mg by mouth every evening. Reported on 12/18/2015    . Cholecalciferol (VITAMIN D3) 5000 units CAPS Take 1 capsule (5,000 Units total) by mouth daily. 90 capsule 0  . diazepam (VALIUM) 10 MG tablet Take 10 mg by mouth every 6 (six) hours as needed for anxiety.    Marland Kitchen ezetimibe (ZETIA) 10 MG tablet Take 10 mg by mouth at bedtime.     . finasteride (PROSCAR) 5 MG tablet Take 10 mg by mouth daily.    Marland Kitchen levothyroxine (SYNTHROID, LEVOTHROID) 200 MCG tablet Take 200 mcg by mouth daily before breakfast.    . levothyroxine (SYNTHROID, LEVOTHROID) 25 MCG tablet TAKE ONE TABLET (25 MCG TOTAL) BY MOUTH DAILY BEFORE BREAKFAST. TAKE ALONG WITH 200 MCG FOR TOTAL DOSE OF 225 MCG. 90 tablet 0  . levothyroxine (SYNTHROID, LEVOTHROID) 25 MCG tablet Take 25 mcg by mouth daily before breakfast.    . losartan-hydrochlorothiazide (HYZAAR) 100-25 MG tablet TAKE 1 TABLET BY MOUTH DAILY 90 tablet 0  . metFORMIN (GLUCOPHAGE) 500 MG tablet TAKE 1 TABLET BY MOUTH TWICE DAILY WITH A MEAL 180 tablet 0  .  metoprolol tartrate (LOPRESSOR) 50 MG tablet TAKE ONE TABLET BY MOUTH TWICE A DAY 180 tablet 0   No current facility-administered medications for this visit.     Past Medical History:  Diagnosis Date  . Arthritis   . Hypercholesterolemia   . Hypertension   . Hypothyroidism   . MI (myocardial infarction) ([place])    in his 16s    Past Surgical History:  Procedure Laterality Date  . APPENDECTOMY    . CATARACT EXTRACTION Left   . COLONOSCOPY N/A 02/01/2014   Procedure: COLONOSCOPY;  Surgeon: Daneil Dolin, MD;  Location[facility];  Service: Endoscopy;  Laterality: N/A;  8:30  . CORONARY ANGIOPLASTY WITH STENT PLACEMENT      1 stent    Social History   Socioeconomic History  . Marital status: Married    Spouse name: Not on file  . Number of children: Not on file  . Years of education: Not on file  . Highest education level: Not on file  Occupational History  . Occupation: self-employed  Social Needs  . Financial resource strain: Not on file  . Food insecurity:    Worry: Not on file    Inability: Not on file  . Transportation needs:    Medical: Not on file    Non-medical: Not on file  Tobacco Use  . Smoking status: Former Smoker    Packs/day: 2.00    Years: 30.00    Pack years: 60.00    Types: Cigarettes    Last attempt to quit: 02/02/2004    Years since quitting: 14.1  . Smokeless tobacco: Never Used  Substance and Sexual Activity  . Alcohol use: Yes    Comment: occasional  . Drug use: No  . Sexual activity: Yes    Birth control/protection: None  Lifestyle  . Physical activity:    Days per week: Not on file    Minutes per session: Not on file  . Stress: Not on file  Relationships  . Social connections:    Talks on phone: Not on file    Gets together: Not on file    Attends religious service: Not on file    Active member of club or organization: Not on file    Attends meetings of clubs or organizations: Not on file    Relationship status: Not on file  . Intimate partner violence:    Fear of current or ex partner: Not on file    Emotionally abused: Not on file    Physically abused: Not on file    Forced sexual activity: Not on file  Other Topics Concern  . Not on file  Social History Narrative  . Not on file     Vitals:   03/11/18 1458  BP: (!) 144/80  Pulse: 82  SpO2: 98%  Weight: 298 lb (135.2 kg)  Height: 5\' 10"  (1.778 m)    Wt Readings from Last 3 Encounters:  03/11/18 298 lb (135.2 kg)  09/30/17 279 lb (126.6 kg)  06/03/17 (!) 303 lb (137.4 kg)     PHYSICAL EXAM General: NAD HEENT: Normal. Neck: No JVD, no thyromegaly. Lungs: Clear to auscultation  bilaterally with normal respiratory effort. CV: Regular rate and rhythm, normal S1/S2, no S3/S4, no murmur. No pretibial or periankle edema.  No carotid bruit.   Abdomen: Firm, obese.  Neurologic: Alert and oriented.  Psych: Normal affect. Skin: Normal. Musculoskeletal: No gross deformities.    ECG: Most recent ECG reviewed.   Labs: Lab Results  Component Value Date/Time   K 4.4 09/14/2017 09:18 AM   BUN 24 09/14/2017 09:18 AM   CREATININE 1.31 (H) 09/14/2017 09:18 AM   TSH 0.02 (L) 09/14/2017 09:18 AM   HGB 15.0 04/20/2016 04:26 PM     Lipids: Lab Results  Component Value Date/Time   LDLCALC 79 09/14/2017 09:18 AM   CHOL 162 09/14/2017 09:18 AM   TRIG 346 (H) 09/14/2017 09:18 AM   HDL 41 09/14/2017 09:18 AM       ASSESSMENT AND PLAN:  1. Coronary artery disease with prior stenting of the obtuse marginal with progressive exertional dyspnea: ECG is unremarkable. While exertional dyspnea may be due to excess weight gain in the context of morbid obesity, symptoms are also suspicious for an anginal equivalent. He denies chest pain. I think a nuclear stress test is likely to show a significant degree of soft tissue attenuation artifact.  He is not a candidate for coronary CT angiography due to his prior stent. I will arrange for left heart catheterization and coronary angiography. Risks and benefits of cardiac catheterization have been discussed with the patient.  These include bleeding, infection, kidney damage, stroke, heart attack, death.  The patient understands these risks and is willing to proceed. Continue ASA, Zetia, and metoprolol.  2. Hypertension:  Persistently elevated.  I will start amlodipine 5 mg daily.  3. Hyperlipidemia: Continue Zetia. I will obtain copy of lipids.  4. Morbid obesity: Needs significant weight loss. Needs to exercise.     Disposition: Follow up after cardiac catheterization  Time spent: 40 minutes, of which greater than 50% was spent  reviewing symptoms, relevant blood tests and studies, and discussing management plan with the patient.    Kate Sable, M.D., F.A.C.C.

## 2018-03-11 NOTE — Progress Notes (Signed)
SUBJECTIVE: The patient presents for routine follow-up. He has a history of coronary artery disease with previous stenting of the obtuse marginal branch in 2005 with a Taxus drug-eluting stent. A follow-up catheterization in 2006 as part of a research trial showed that his stents were patent.  His son tried to commit suicide in 2017 and the patient has experienced depression since that time.  Echocardiogram 02/19/2017: Normal left ventricular systolic function and regional wall motion, LVEF 60 to 65%, mild LVH, grade 1 diastolic dysfunction.  He is here with his wife, Hildred Priest.  For the past few months he has been experience and progressive exertional dyspnea when walking from one side of the house to the other.  He went to a concert at [facility] 2 nights ago and had to climb 3 flights of stairs.  After coming to flights he had to stop due to shortness of breath and palpitations.  He also describes an occasional "brain fog".  He denies exertional chest pain.  He initially went on the Atkins diet and lost 40 pounds.  He then developed kidney disease and was told to stop this diet due to it being a high protein diet.  Since then he is put back on the weight.  His blood pressure had been staying in the 120/70-80 range but now stays in the mid 140/80 range consistently.  He was at [employer] 3 weeks ago with his family.  He developed back pain and left shoulder pain.  His daughter was concerned he might be experience in a cardiac event and encouraged him to go to the hospital.  He did not.  When he came back home to [place], he saw his orthopedic surgeon who diagnosed left rotator cuff tendinitis and some trigger points in his back.  ECG performed today which I personally reviewed demonstrates normal sinus rhythm with baseline artifact.   Soc Hx: His daughter is a Music therapist.    Review of Systems: As per "subjective", otherwise negative.  Allergies  Allergen Reactions  . Meat  Extract Diarrhea, Nausea Only and Other (See Comments)    alphagal reaction  . Lipitor [Atorvastatin] Other (See Comments)    Muscle cramps  . Simvastatin Other (See Comments)    Muscle cramps     Current Outpatient Medications  Medication Sig Dispense Refill  . aspirin EC 81 MG tablet Take 81 mg by mouth every evening. Reported on 12/18/2015    . Cholecalciferol (VITAMIN D3) 5000 units CAPS Take 1 capsule (5,000 Units total) by mouth daily. 90 capsule 0  . diazepam (VALIUM) 10 MG tablet Take 10 mg by mouth every 6 (six) hours as needed for anxiety.    Marland Kitchen ezetimibe (ZETIA) 10 MG tablet Take 10 mg by mouth at bedtime.     . finasteride (PROSCAR) 5 MG tablet Take 10 mg by mouth daily.    Marland Kitchen levothyroxine (SYNTHROID, LEVOTHROID) 200 MCG tablet Take 200 mcg by mouth daily before breakfast.    . levothyroxine (SYNTHROID, LEVOTHROID) 25 MCG tablet TAKE ONE TABLET (25 MCG TOTAL) BY MOUTH DAILY BEFORE BREAKFAST. TAKE ALONG WITH 200 MCG FOR TOTAL DOSE OF 225 MCG. 90 tablet 0  . levothyroxine (SYNTHROID, LEVOTHROID) 25 MCG tablet Take 25 mcg by mouth daily before breakfast.    . losartan-hydrochlorothiazide (HYZAAR) 100-25 MG tablet TAKE 1 TABLET BY MOUTH DAILY 90 tablet 0  . metFORMIN (GLUCOPHAGE) 500 MG tablet TAKE 1 TABLET BY MOUTH TWICE DAILY WITH A MEAL 180 tablet 0  .  metoprolol tartrate (LOPRESSOR) 50 MG tablet TAKE ONE TABLET BY MOUTH TWICE A DAY 180 tablet 0   No current facility-administered medications for this visit.     Past Medical History:  Diagnosis Date  . Arthritis   . Hypercholesterolemia   . Hypertension   . Hypothyroidism   . MI (myocardial infarction) ([place])    in his 35s    Past Surgical History:  Procedure Laterality Date  . APPENDECTOMY    . CATARACT EXTRACTION Left   . COLONOSCOPY N/A 02/01/2014   Procedure: COLONOSCOPY;  Surgeon: Daneil Dolin, MD;  Location: [facility];  Service: Endoscopy;  Laterality: N/A;  8:30  . CORONARY ANGIOPLASTY WITH STENT PLACEMENT      1 stent    Social History   Socioeconomic History  . Marital status: Married    Spouse name: Not on file  . Number of children: Not on file  . Years of education: Not on file  . Highest education level: Not on file  Occupational History  . Occupation: self-employed  Social Needs  . Financial resource strain: Not on file  . Food insecurity:    Worry: Not on file    Inability: Not on file  . Transportation needs:    Medical: Not on file    Non-medical: Not on file  Tobacco Use  . Smoking status: Former Smoker    Packs/day: 2.00    Years: 30.00    Pack years: 60.00    Types: Cigarettes    Last attempt to quit: 02/02/2004    Years since quitting: 14.1  . Smokeless tobacco: Never Used  Substance and Sexual Activity  . Alcohol use: Yes    Comment: occasional  . Drug use: No  . Sexual activity: Yes    Birth control/protection: None  Lifestyle  . Physical activity:    Days per week: Not on file    Minutes per session: Not on file  . Stress: Not on file  Relationships  . Social connections:    Talks on phone: Not on file    Gets together: Not on file    Attends religious service: Not on file    Active member of club or organization: Not on file    Attends meetings of clubs or organizations: Not on file    Relationship status: Not on file  . Intimate partner violence:    Fear of current or ex partner: Not on file    Emotionally abused: Not on file    Physically abused: Not on file    Forced sexual activity: Not on file  Other Topics Concern  . Not on file  Social History Narrative  . Not on file     Vitals:   03/11/18 1458  BP: (!) 144/80  Pulse: 82  SpO2: 98%  Weight: 298 lb (135.2 kg)  Height: 5\' 10"  (1.778 m)    Wt Readings from Last 3 Encounters:  03/11/18 298 lb (135.2 kg)  09/30/17 279 lb (126.6 kg)  06/03/17 (!) 303 lb (137.4 kg)     PHYSICAL EXAM General: NAD HEENT: Normal. Neck: No JVD, no thyromegaly. Lungs: Clear to auscultation  bilaterally with normal respiratory effort. CV: Regular rate and rhythm, normal S1/S2, no S3/S4, no murmur. No pretibial or periankle edema.  No carotid bruit.   Abdomen: Firm, obese.  Neurologic: Alert and oriented.  Psych: Normal affect. Skin: Normal. Musculoskeletal: No gross deformities.    ECG: Most recent ECG reviewed.   Labs: Lab Results  Component Value Date/Time   K 4.4 09/14/2017 09:18 AM   BUN 24 09/14/2017 09:18 AM   CREATININE 1.31 (H) 09/14/2017 09:18 AM   TSH 0.02 (L) 09/14/2017 09:18 AM   HGB 15.0 04/20/2016 04:26 PM     Lipids: Lab Results  Component Value Date/Time   LDLCALC 79 09/14/2017 09:18 AM   CHOL 162 09/14/2017 09:18 AM   TRIG 346 (H) 09/14/2017 09:18 AM   HDL 41 09/14/2017 09:18 AM       ASSESSMENT AND PLAN:  1. Coronary artery disease with prior stenting of the obtuse marginal with progressive exertional dyspnea: ECG is unremarkable. While exertional dyspnea may be due to excess weight gain in the context of morbid obesity, symptoms are also suspicious for an anginal equivalent. He denies chest pain. I think a nuclear stress test is likely to show a significant degree of soft tissue attenuation artifact.  He is not a candidate for coronary CT angiography due to his prior stent. I will arrange for left heart catheterization and coronary angiography. Risks and benefits of cardiac catheterization have been discussed with the patient.  These include bleeding, infection, kidney damage, stroke, heart attack, death.  The patient understands these risks and is willing to proceed. Continue ASA, Zetia, and metoprolol.  2. Hypertension:  Persistently elevated.  I will start amlodipine 5 mg daily.  3. Hyperlipidemia: Continue Zetia. I will obtain copy of lipids.  4. Morbid obesity: Needs significant weight loss. Needs to exercise.     Disposition: Follow up after cardiac catheterization  Time spent: 40 minutes, of which greater than 50% was spent  reviewing symptoms, relevant blood tests and studies, and discussing management plan with the patient.    Kate Sable, M.D., F.A.C.C.

## 2018-03-11 NOTE — Telephone Encounter (Signed)
Pre-cert Verification for the following procedure     L heart cath with harding 5/20 at 10:30

## 2018-03-15 ENCOUNTER — Ambulatory Visit (HOSPITAL_COMMUNITY)
Admission: RE | Disposition: A | Discharge status: Home / Self Care | Payer: Self-pay | Source: Ambulatory Visit | Attending: Cardiology

## 2018-03-15 ENCOUNTER — Encounter (HOSPITAL_COMMUNITY): Payer: Self-pay | Admitting: Cardiology

## 2018-03-15 ENCOUNTER — Ambulatory Visit (HOSPITAL_COMMUNITY)
Admission: RE | Admit: 2018-03-15 | Discharge: 2018-03-15 | Disposition: A | Discharge status: Home / Self Care | Payer: Medicare HMO | Source: Ambulatory Visit | Attending: Cardiology | Admitting: Cardiology

## 2018-03-15 DIAGNOSIS — E7800 Pure hypercholesterolemia, unspecified: Secondary | ICD-10-CM | POA: Diagnosis not present

## 2018-03-15 DIAGNOSIS — I25119 Atherosclerotic heart disease of native coronary artery with unspecified angina pectoris: Secondary | ICD-10-CM | POA: Insufficient documentation

## 2018-03-15 DIAGNOSIS — E039 Hypothyroidism, unspecified: Secondary | ICD-10-CM | POA: Diagnosis not present

## 2018-03-15 DIAGNOSIS — Z9889 Other specified postprocedural states: Secondary | ICD-10-CM | POA: Insufficient documentation

## 2018-03-15 DIAGNOSIS — Z955 Presence of coronary angioplasty implant and graft: Secondary | ICD-10-CM | POA: Diagnosis not present

## 2018-03-15 DIAGNOSIS — Z9842 Cataract extraction status, left eye: Secondary | ICD-10-CM | POA: Insufficient documentation

## 2018-03-15 DIAGNOSIS — Z888 Allergy status to other drugs, medicaments and biological substances status: Secondary | ICD-10-CM | POA: Diagnosis not present

## 2018-03-15 DIAGNOSIS — I10 Essential (primary) hypertension: Secondary | ICD-10-CM | POA: Insufficient documentation

## 2018-03-15 DIAGNOSIS — Z79890 Hormone replacement therapy (postmenopausal): Secondary | ICD-10-CM | POA: Insufficient documentation

## 2018-03-15 DIAGNOSIS — F329 Major depressive disorder, single episode, unspecified: Secondary | ICD-10-CM | POA: Diagnosis not present

## 2018-03-15 DIAGNOSIS — I252 Old myocardial infarction: Secondary | ICD-10-CM | POA: Diagnosis not present

## 2018-03-15 DIAGNOSIS — I25118 Atherosclerotic heart disease of native coronary artery with other forms of angina pectoris: Secondary | ICD-10-CM

## 2018-03-15 DIAGNOSIS — M1990 Unspecified osteoarthritis, unspecified site: Secondary | ICD-10-CM | POA: Insufficient documentation

## 2018-03-15 DIAGNOSIS — Z6841 Body Mass Index (BMI) 40.0 and over, adult: Secondary | ICD-10-CM | POA: Insufficient documentation

## 2018-03-15 DIAGNOSIS — Z79899 Other long term (current) drug therapy: Secondary | ICD-10-CM | POA: Diagnosis not present

## 2018-03-15 DIAGNOSIS — E785 Hyperlipidemia, unspecified: Secondary | ICD-10-CM | POA: Diagnosis present

## 2018-03-15 DIAGNOSIS — E669 Obesity, unspecified: Secondary | ICD-10-CM

## 2018-03-15 DIAGNOSIS — R0609 Other forms of dyspnea: Secondary | ICD-10-CM | POA: Insufficient documentation

## 2018-03-15 DIAGNOSIS — Z87891 Personal history of nicotine dependence: Secondary | ICD-10-CM | POA: Diagnosis not present

## 2018-03-15 DIAGNOSIS — E781 Pure hyperglyceridemia: Secondary | ICD-10-CM | POA: Diagnosis present

## 2018-03-15 DIAGNOSIS — Z91018 Allergy to other foods: Secondary | ICD-10-CM | POA: Diagnosis not present

## 2018-03-15 DIAGNOSIS — Z7984 Long term (current) use of oral hypoglycemic drugs: Secondary | ICD-10-CM | POA: Insufficient documentation

## 2018-03-15 DIAGNOSIS — Z7982 Long term (current) use of aspirin: Secondary | ICD-10-CM | POA: Diagnosis not present

## 2018-03-15 DIAGNOSIS — I2584 Coronary atherosclerosis due to calcified coronary lesion: Secondary | ICD-10-CM | POA: Insufficient documentation

## 2018-03-15 DIAGNOSIS — E1169 Type 2 diabetes mellitus with other specified complication: Secondary | ICD-10-CM | POA: Diagnosis present

## 2018-03-15 HISTORY — PX: LEFT HEART CATH AND CORONARY ANGIOGRAPHY: CATH118249

## 2018-03-15 HISTORY — DX: Atherosclerotic heart disease of native coronary artery without angina pectoris: I25.10

## 2018-03-15 HISTORY — DX: Coronary angioplasty status: Z98.61

## 2018-03-15 LAB — CBC
HCT: 44.7 % (ref 39.0–52.0)
Hemoglobin: 14.7 g/dL (ref 13.0–17.0)
MCH: 28 pg (ref 26.0–34.0)
MCHC: 32.9 g/dL (ref 30.0–36.0)
MCV: 85.1 fL (ref 78.0–100.0)
Platelets: 250 10*3/uL (ref 150–400)
RBC: 5.25 MIL/uL (ref 4.22–5.81)
RDW: 13.8 % (ref 11.5–15.5)
WBC: 8.9 10*3/uL (ref 4.0–10.5)

## 2018-03-15 LAB — BASIC METABOLIC PANEL
ANION GAP: 13 (ref 5–15)
BUN: 19 mg/dL (ref 6–20)
CO2: 22 mmol/L (ref 22–32)
Calcium: 9.2 mg/dL (ref 8.9–10.3)
Chloride: 103 mmol/L (ref 101–111)
Creatinine, Ser: 1.26 mg/dL — ABNORMAL HIGH (ref 0.61–1.24)
GFR calc Af Amer: >60 mL/min (ref >60)
GFR, EST NON AFRICAN AMERICAN: 57 mL/min — AB (ref >60)
GLUCOSE: 151 mg/dL — AB (ref 65–99)
Potassium: 4 mmol/L (ref 3.5–5.1)
SODIUM: 138 mmol/L (ref 135–145)

## 2018-03-15 LAB — GLUCOSE, CAPILLARY: GLUCOSE-CAPILLARY: 151 mg/dL — AB (ref 65–99)

## 2018-03-15 SURGERY — LEFT HEART CATH AND CORONARY ANGIOGRAPHY
Anesthesia: LOCAL

## 2018-03-15 MED ORDER — MIDAZOLAM HCL 2 MG/2ML IJ SOLN
INTRAMUSCULAR | Status: AC
  Filled 2018-03-15: qty 2

## 2018-03-15 MED ORDER — HEPARIN (PORCINE) IN NACL 1000-0.9 UT/500ML-% IV SOLN
INTRAVENOUS | Status: AC
Start: 2018-03-15 — End: ?
  Filled 2018-03-15: qty 1000

## 2018-03-15 MED ORDER — VERAPAMIL HCL 2.5 MG/ML IV SOLN
INTRAVENOUS | Status: DC | PRN
  Administered 2018-03-15: 10 mL via INTRA_ARTERIAL

## 2018-03-15 MED ORDER — ACETAMINOPHEN 325 MG PO TABS: 650.0000 mg | ORAL_TABLET | ORAL | Status: DC | PRN

## 2018-03-15 MED ORDER — ONDANSETRON HCL 4 MG/2ML IJ SOLN: 4.0000 mg | Freq: Four times a day (QID) | INTRAMUSCULAR | Status: DC | PRN

## 2018-03-15 MED ORDER — SODIUM CHLORIDE 0.9 % IV SOLN: 250.0000 mL | INTRAVENOUS | Status: DC | PRN

## 2018-03-15 MED ORDER — ASPIRIN 81 MG PO CHEW
81.0000 mg | CHEWABLE_TABLET | ORAL | Status: AC
  Administered 2018-03-15: 81 mg via ORAL

## 2018-03-15 MED ORDER — SODIUM CHLORIDE 0.9% FLUSH
3.0000 mL | INTRAVENOUS | Status: DC | PRN
Start: 2018-03-15 — End: 2018-03-15

## 2018-03-15 MED ORDER — HEPARIN (PORCINE) IN NACL 2-0.9 UNITS/ML
INTRAMUSCULAR | Status: AC | PRN
  Administered 2018-03-15 (×2): 500 mL

## 2018-03-15 MED ORDER — HEPARIN SODIUM (PORCINE) 1000 UNIT/ML IJ SOLN
INTRAMUSCULAR | Status: DC | PRN
  Administered 2018-03-15: 6000 [IU] via INTRAVENOUS

## 2018-03-15 MED ORDER — MIDAZOLAM HCL 2 MG/2ML IJ SOLN
INTRAMUSCULAR | Status: DC | PRN
  Administered 2018-03-15: 1 mg via INTRAVENOUS

## 2018-03-15 MED ORDER — SODIUM CHLORIDE 0.9% FLUSH: 3.0000 mL | Freq: Two times a day (BID) | INTRAVENOUS | Status: DC

## 2018-03-15 MED ORDER — LIDOCAINE HCL (PF) 1 % IJ SOLN
INTRAMUSCULAR | Status: AC
  Filled 2018-03-15: qty 30

## 2018-03-15 MED ORDER — VERAPAMIL HCL 2.5 MG/ML IV SOLN
INTRAVENOUS | Status: AC
  Filled 2018-03-15: qty 2

## 2018-03-15 MED ORDER — LIDOCAINE HCL (PF) 1 % IJ SOLN
INTRAMUSCULAR | Status: DC | PRN
  Administered 2018-03-15: 2 mL via SUBCUTANEOUS

## 2018-03-15 MED ORDER — FENTANYL CITRATE (PF) 100 MCG/2ML IJ SOLN
INTRAMUSCULAR | Status: AC
  Filled 2018-03-15: qty 2

## 2018-03-15 MED ORDER — HEPARIN SODIUM (PORCINE) 1000 UNIT/ML IJ SOLN
INTRAMUSCULAR | Status: AC
Start: 2018-03-15 — End: ?
  Filled 2018-03-15: qty 1

## 2018-03-15 MED ORDER — FENTANYL CITRATE (PF) 100 MCG/2ML IJ SOLN
INTRAMUSCULAR | Status: DC | PRN
  Administered 2018-03-15: 25 ug via INTRAVENOUS

## 2018-03-15 MED ORDER — IOHEXOL 350 MG/ML SOLN
INTRAVENOUS | Status: DC | PRN
  Administered 2018-03-15: 90 mL via INTRA_ARTERIAL

## 2018-03-15 MED ORDER — ASPIRIN 81 MG PO CHEW
CHEWABLE_TABLET | ORAL | Status: AC
  Filled 2018-03-15: qty 1

## 2018-03-15 MED ORDER — SODIUM CHLORIDE 0.9 % IV SOLN: INTRAVENOUS | Status: AC

## 2018-03-15 MED ORDER — SODIUM CHLORIDE 0.9 % IV SOLN
INTRAVENOUS | Status: DC
  Administered 2018-03-15: 09:00:00 via INTRAVENOUS

## 2018-03-15 MED ORDER — SODIUM CHLORIDE 0.9% FLUSH: 3.0000 mL | INTRAVENOUS | Status: DC | PRN

## 2018-03-15 SURGICAL SUPPLY — 12 items
CATH INFINITI 5FR ANG PIGTAIL (CATHETERS) ×1 IMPLANT
CATH OPTITORQUE TIG 4.0 5F (CATHETERS) ×1 IMPLANT
DEVICE RAD COMP TR BAND LRG (VASCULAR PRODUCTS) ×1 IMPLANT
GLIDESHEATH SLEND A-KIT 6F 22G (SHEATH) ×1 IMPLANT
GUIDEWIRE INQWIRE 1.5J.035X260 (WIRE) IMPLANT
HOVERMATT SINGLE USE (MISCELLANEOUS) ×2 IMPLANT
INQWIRE 1.5J .035X260CM (WIRE) ×2
KIT HEART LEFT (KITS) ×2 IMPLANT
PACK CARDIAC CATHETERIZATION (CUSTOM PROCEDURE TRAY) ×2 IMPLANT
SYR MEDRAD MARK V 150ML (SYRINGE) ×2 IMPLANT
TRANSDUCER W/STOPCOCK (MISCELLANEOUS) ×2 IMPLANT
TUBING CIL FLEX 10 FLL-RA (TUBING) ×2 IMPLANT

## 2018-03-15 NOTE — Interval H&P Note (Signed)
History and Physical Interval Note:  03/15/2018 10:30 AM  Brandon Warren  has presented today for surgery, with the diagnosis of progressive/unstable angina  The various methods of treatment have been discussed with the patient and family. After consideration of risks, benefits and other options for treatment, the patient has consented to  Procedure(s): LEFT HEART CATH AND CORONARY ANGIOGRAPHY (N/A) as a surgical intervention .  The patient's history has been reviewed, patient examined, no change in status, stable for surgery.  I have reviewed the patient's chart and labs.  Questions were answered to the patient's satisfaction.    Cath Lab Visit (complete for each Cath Lab visit)  Clinical Evaluation Leading to the Procedure:   ACS: No.  Non-ACS:    Anginal Classification: CCS III  Anti-ischemic medical therapy: Minimal Therapy (1 class of medications)  Non-Invasive Test Results: No non-invasive testing performed  Prior CABG: No previous CABG    Brandon Warren

## 2018-03-15 NOTE — Research (Signed)
OPTIMIZE Informed Consent   Subject Name: Brandon Warren  Subject met inclusion and exclusion criteria.  The informed consent form, study requirements and expectations were reviewed with the subject and questions and concerns were addressed prior to the signing of the consent form.  The subject verbalized understanding of the trial requirements.  The subject agreed to participate in the OPTIMIZE trial and signed the informed consent.  The informed consent was obtained prior to performance of any protocol-specific procedures for the subject.  A copy of the signed informed consent was given to the subject and a copy was placed in the subject's medical record. Only applicable if randomized.   [name] D 03/15/2018, 1025 am

## 2018-03-15 NOTE — Discharge Instructions (Signed)
**Note Brandon Warren-identified via Obfuscation** Radial Site Care °Refer to this sheet in the next few weeks. These instructions provide you with information about caring for yourself after your procedure. Your health care provider may also give you more specific instructions. Your treatment has been planned according to current medical practices, but problems sometimes occur. Call your health care provider if you have any problems or questions after your procedure. °What can I expect after the procedure? °After your procedure, it is typical to have the following: °· Bruising at the radial site that usually fades within 1-2 weeks. °· Blood collecting in the tissue (hematoma) that may be painful to the touch. It should usually decrease in size and tenderness within 1-2 weeks. ° °Follow these instructions at home: °· Take medicines only as directed by your health care provider. °· You may shower 24-48 hours after the procedure or as directed by your health care provider. Remove the bandage (dressing) and gently wash the site with plain soap and water. Pat the area dry with a clean towel. Do not rub the site, because this may cause bleeding. °· Do not take baths, swim, or use a hot tub until your health care provider approves. °· Check your insertion site every day for redness, swelling, or drainage. °· Do not apply powder or lotion to the site. °· Do not flex or bend the affected arm for 24 hours or as directed by your health care provider. °· Do not push or pull heavy objects with the affected arm for 24 hours or as directed by your health care provider. °· Do not lift over 10 lb (4.5 kg) for 5 days after your procedure or as directed by your health care provider. °· Ask your health care provider when it is okay to: °? Return to work or school. °? Resume usual physical activities or sports. °? Resume sexual activity. °· Do not drive home if you are discharged the same day as the procedure. Have someone else drive you. °· You may drive 24 hours after the procedure  unless otherwise instructed by your health care provider. °· Do not operate machinery or power tools for 24 hours after the procedure. °· If your procedure was done as an outpatient procedure, which means that you went home the same day as your procedure, a responsible adult should be with you for the first 24 hours after you arrive home. °· Keep all follow-up visits as directed by your health care provider. This is important. °Contact a health care provider if: °· You have a fever. °· You have chills. °· You have increased bleeding from the radial site. Hold pressure on the site. °Get help right away if: °· You have unusual pain at the radial site. °· You have redness, warmth, or swelling at the radial site. °· You have drainage (other than a small amount of blood on the dressing) from the radial site. °· The radial site is bleeding, and the bleeding does not stop after 30 minutes of holding steady pressure on the site. °· Your arm or hand becomes pale, cool, tingly, or numb. °This information is not intended to replace advice given to you by your health care provider. Make sure you discuss any questions you have with your health care provider. °Document Released: 11/15/2010 Document Revised: 03/20/2016 Document Reviewed: 05/01/2014 °Elsevier Interactive Patient Education © 2018 [employer] ° °

## 2018-03-16 ENCOUNTER — Encounter (HOSPITAL_COMMUNITY): Payer: Self-pay | Admitting: Cardiology

## 2018-03-16 MED FILL — Heparin Sod (Porcine)-NaCl IV Soln 1000 Unit/500ML-0.9%: INTRAVENOUS | Qty: 1000 | Status: AC

## 2018-03-22 ENCOUNTER — Encounter: Payer: Self-pay | Admitting: Cardiovascular Disease

## 2018-03-23 ENCOUNTER — Telehealth: Payer: Self-pay | Admitting: Cardiovascular Disease

## 2018-03-23 DIAGNOSIS — R972 Elevated prostate specific antigen [PSA]: Secondary | ICD-10-CM | POA: Diagnosis not present

## 2018-03-23 DIAGNOSIS — E039 Hypothyroidism, unspecified: Secondary | ICD-10-CM | POA: Diagnosis not present

## 2018-03-23 DIAGNOSIS — E119 Type 2 diabetes mellitus without complications: Secondary | ICD-10-CM | POA: Diagnosis not present

## 2018-03-23 DIAGNOSIS — E782 Mixed hyperlipidemia: Secondary | ICD-10-CM | POA: Diagnosis not present

## 2018-03-23 DIAGNOSIS — E1159 Type 2 diabetes mellitus with other circulatory complications: Secondary | ICD-10-CM | POA: Diagnosis not present

## 2018-03-23 DIAGNOSIS — E559 Vitamin D deficiency, unspecified: Secondary | ICD-10-CM | POA: Diagnosis not present

## 2018-03-23 NOTE — Telephone Encounter (Signed)
Spoke with Legrand Como - give pertinent info & Dr. Uvaldo Bristle has approved the left heart cath - authorization will be faxed over in the next couple of hours.

## 2018-03-23 NOTE — Telephone Encounter (Signed)
New Message   Stacey at Dr. Uvaldo Bristle office is trying to close a case regarding the pt's left heart cath. They do not know what it was for and is requesting a peer to peer. Please call

## 2018-03-24 LAB — LIPID PANEL
CHOL/HDL RATIO: 3.7 (calc) (ref <5.0)
CHOLESTEROL: 199 mg/dL (ref <200)
HDL: 54 mg/dL (ref >40)
LDL Cholesterol (Calc): 94 mg/dL (calc)
Non-HDL Cholesterol (Calc): 145 mg/dL (calc) — ABNORMAL HIGH (ref <130)
TRIGLYCERIDES: 384 mg/dL — AB (ref <150)

## 2018-03-24 LAB — COMPLETE METABOLIC PANEL WITH GFR
AG RATIO: 1.5 (calc) (ref 1.0–2.5)
ALT: 36 U/L (ref 9–46)
AST: 30 U/L (ref 10–35)
Albumin: 4.6 g/dL (ref 3.6–5.1)
Alkaline phosphatase (APISO): 41 U/L (ref 40–115)
BILIRUBIN TOTAL: 0.6 mg/dL (ref 0.2–1.2)
BUN/Creatinine Ratio: 20 (calc) (ref 6–22)
BUN: 25 mg/dL (ref 7–25)
CHLORIDE: 99 mmol/L (ref 98–110)
CO2: 22 mmol/L (ref 20–32)
Calcium: 9.9 mg/dL (ref 8.6–10.3)
Creat: 1.28 mg/dL — ABNORMAL HIGH (ref 0.70–1.25)
GFR, EST AFRICAN AMERICAN: 67 mL/min/{1.73_m2} (ref >=60)
GFR, Est Non African American: 58 mL/min/{1.73_m2} — ABNORMAL LOW (ref >=60)
Globulin: 3.1 g/dL (calc) (ref 1.9–3.7)
Glucose, Bld: 162 mg/dL — ABNORMAL HIGH (ref 65–99)
POTASSIUM: 4.5 mmol/L (ref 3.5–5.3)
Sodium: 134 mmol/L — ABNORMAL LOW (ref 135–146)
Total Protein: 7.7 g/dL (ref 6.1–8.1)

## 2018-03-24 LAB — HEMOGLOBIN A1C
Hgb A1c MFr Bld: 6.6 % of total Hgb — ABNORMAL HIGH (ref <5.7)
Mean Plasma Glucose: 143 (calc)
eAG (mmol/L): 7.9 (calc)

## 2018-03-24 LAB — VITAMIN D 25 HYDROXY (VIT D DEFICIENCY, FRACTURES): VIT D 25 HYDROXY: 39 ng/mL (ref 30–100)

## 2018-03-24 LAB — TSH: TSH: 2.97 mIU/L (ref 0.40–4.50)

## 2018-03-24 LAB — T4, FREE: Free T4: 1.3 ng/dL (ref 0.8–1.8)

## 2018-03-31 ENCOUNTER — Ambulatory Visit: Payer: Medicare HMO | Admitting: "Endocrinology

## 2018-03-31 DIAGNOSIS — Z1389 Encounter for screening for other disorder: Secondary | ICD-10-CM | POA: Diagnosis not present

## 2018-03-31 DIAGNOSIS — R0989 Other specified symptoms and signs involving the circulatory and respiratory systems: Secondary | ICD-10-CM | POA: Diagnosis not present

## 2018-03-31 DIAGNOSIS — R05 Cough: Secondary | ICD-10-CM | POA: Diagnosis not present

## 2018-03-31 DIAGNOSIS — R0981 Nasal congestion: Secondary | ICD-10-CM | POA: Diagnosis not present

## 2018-03-31 DIAGNOSIS — J019 Acute sinusitis, unspecified: Secondary | ICD-10-CM | POA: Diagnosis not present

## 2018-03-31 DIAGNOSIS — Z6837 Body mass index (BMI) 37.0-37.9, adult: Secondary | ICD-10-CM | POA: Diagnosis not present

## 2018-04-03 ENCOUNTER — Other Ambulatory Visit: Payer: Self-pay | Admitting: "Endocrinology

## 2018-04-09 ENCOUNTER — Encounter: Payer: Self-pay | Admitting: Student

## 2018-04-09 NOTE — Progress Notes (Signed)
Cardiology Office Note    Date:  04/13/2018   ID:  [name], DOB 01/16/1951, MRN [id number]  PCP:  Redmond School, MD  Cardiologist: Kate Sable, MD    Chief Complaint  Patient presents with  . Follow-up    s/p catheterization    History of Present Illness:    Brandon Warren is a 67 y.o. male with past medical history of CAD (s/p DES to OM in 2005), HTN, HLD, and morbid obesity who presents to the office today for follow-up from his recent cardiac catheterization.    He was last examined by Dr. Bronson Ing on 03/11/2018 and reported progressive dyspnea on exertion over the past few months. He reported having to climb a flight of stairs at a concert 3 night s prior to his visit and developed significant shortness of breath and palpitations with this. Given his progressive symptoms, a cardiac catheterization was recommended for definitive evaluation. This was performed on 03/15/2018 and showed 20% stenosis along the mid to distal left main with 20% ISR of the previously placed LCx stent. EF was preserved at 55 to 65% but LVEDP was moderately elevated. Continued medical management was recommended.  In talking with the patient today, he does report that his breathing has somewhat improved since his catheterization but remains labored as he gets short of breath when walking from one room of the house to another. Denies any specific orthopnea or PND and reports good compliance with his CPAP. No recent travel. Denies any exertional chest discomfort or palpitations.  He does not weigh himself daily but says his weight has increased by over 30 pounds within the past 4 months. He equated a large portion of this due to increased stress. He does follow blood pressure regularly and says this has been well controlled since being started on Amlodipine 5 mg daily at the time of his last office visit. BP is at 136/72 during today's visit.   Past Medical History:  Diagnosis Date  . Arthritis    . CAD S/P percutaneous coronary angioplasty    a. 2005: PCI OM - Zomax study stent DES  2.5 x16 mm. b. cath in 02/2018 showing 20% ISR of LCx stent and 20% stenosis along mid-distal LM.   Marland Kitchen Hypercholesterolemia   . Hypertension   . Hypothyroidism   . MI (myocardial infarction) ([place])    in his 78s    Past Surgical History:  Procedure Laterality Date  . APPENDECTOMY    . CATARACT EXTRACTION Left   . COLONOSCOPY N/A 02/01/2014   Procedure: COLONOSCOPY;  Surgeon: Daneil Dolin, MD;  Location: [facility];  Service: Endoscopy;  Laterality: N/A;  8:30  . CORONARY ANGIOPLASTY WITH STENT PLACEMENT  2005   PCI- OM1 Zomax study stent 2.5 x16 mm.  Marland Kitchen LEFT HEART CATH AND CORONARY ANGIOGRAPHY N/A 03/15/2018   Procedure: LEFT HEART CATH AND CORONARY ANGIOGRAPHY;  Surgeon: Leonie Man, MD;  Location: [facility];  Service: Cardiovascular;  Laterality: N/A;    Current Medications: Outpatient Medications Prior to Visit  Medication Sig Dispense Refill  . acetaminophen (TYLENOL) 500 MG tablet Take 1,000 mg by mouth 2 (two) times daily as needed (for pain.).    Marland Kitchen aspirin EC 81 MG tablet Take 81 mg by mouth every evening. Reported on 12/18/2015    . Cholecalciferol (VITAMIN D3) 2000 units TABS Take 2,000 Units by mouth daily.    . Cholecalciferol (VITAMIN D3) 5000 units CAPS Take 1 capsule (5,000 Units  total) by mouth daily. 90 capsule 0  . diazepam (VALIUM) 10 MG tablet Take 10 mg by mouth every 6 (six) hours as needed for anxiety.    Marland Kitchen ezetimibe (ZETIA) 10 MG tablet Take 10 mg by mouth at bedtime.     . finasteride (PROSCAR) 5 MG tablet Take 5 mg by mouth daily.     Marland Kitchen ibuprofen (ADVIL,MOTRIN) 200 MG tablet Take 400 mg by mouth 2 (two) times daily as needed (for pain.).    Marland Kitchen levothyroxine (SYNTHROID, LEVOTHROID) 200 MCG tablet Take 200 mcg by mouth daily before breakfast.    . levothyroxine (SYNTHROID, LEVOTHROID) 25 MCG tablet Take 25 mcg by mouth daily before breakfast.    .  losartan-hydrochlorothiazide (HYZAAR) 100-25 MG tablet TAKE 1 TABLET BY MOUTH DAILY 90 tablet 0  . metFORMIN (GLUCOPHAGE) 500 MG tablet TAKE 1 TABLET BY MOUTH TWICE DAILY WITH A MEAL 180 tablet 0  . metoprolol tartrate (LOPRESSOR) 50 MG tablet TAKE ONE TABLET BY MOUTH TWICE A DAY 180 tablet 0  . tamsulosin (FLOMAX) 0.4 MG CAPS capsule Take 0.4 mg by mouth 2 (two) times daily.    Marland Kitchen amLODipine (NORVASC) 5 MG tablet Take 1 tablet (5 mg total) by mouth daily. (Patient taking differently: Take 5 mg by mouth at bedtime. ) 90 tablet 0  . levothyroxine (SYNTHROID, LEVOTHROID) 25 MCG tablet TAKE ONE TABLET (25 MCG TOTAL) BY MOUTH DAILY BEFORE BREAKFAST. TAKE ALONG WITH 200 MCG FOR TOTAL DOSE OF 225 MCG. 90 tablet 0   No facility-administered medications prior to visit.      Allergies:   Meat extract; Lipitor [atorvastatin]; and Simvastatin   Social History   Socioeconomic History  . Marital status: Married    Spouse name: Not on file  . Number of children: Not on file  . Years of education: Not on file  . Highest education level: Not on file  Occupational History  . Occupation: self-employed  Social Needs  . Financial resource strain: Not on file  . Food insecurity:    Worry: Not on file    Inability: Not on file  . Transportation needs:    Medical: Not on file    Non-medical: Not on file  Tobacco Use  . Smoking status: Former Smoker    Packs/day: 2.00    Years: 30.00    Pack years: 60.00    Types: Cigarettes    Last attempt to quit: 02/02/2004    Years since quitting: 14.2  . Smokeless tobacco: Never Used  Substance and Sexual Activity  . Alcohol use: Yes    Comment: occasional  . Drug use: No  . Sexual activity: Yes    Birth control/protection: None  Lifestyle  . Physical activity:    Days per week: Not on file    Minutes per session: Not on file  . Stress: Not on file  Relationships  . Social connections:    Talks on phone: Not on file    Gets together: Not on file     Attends religious service: Not on file    Active member of club or organization: Not on file    Attends meetings of clubs or organizations: Not on file    Relationship status: Not on file  Other Topics Concern  . Not on file  Social History Narrative  . Not on file     Family History:  The patient's family history includes Colon polyps in his mother.   Review of Systems:   Please see  the history of present illness.     General:  No chills, fever, night sweats or weight changes.  Cardiovascular:  No chest pain, edema, orthopnea, palpitations, paroxysmal nocturnal dyspnea. Positive for dyspnea on exertion.  Dermatological: No rash, lesions/masses Respiratory: No cough, dyspnea Urologic: No hematuria, dysuria Abdominal:   No nausea, vomiting, diarrhea, bright red blood per rectum, melena, or hematemesis Neurologic:  No visual changes, wkns, changes in mental status. All other systems reviewed and are otherwise negative except as noted above.   Physical Exam:    VS:  BP 136/72   Pulse 88   Ht 5\' 10"  (1.778 m)   Wt 300 lb (136.1 kg)   SpO2 96%   BMI 43.05 kg/m    General: Well developed, well nourished Caucasian male appearing in no acute distress. Head: Normocephalic, atraumatic, sclera non-icteric, no xanthomas, nares are without discharge.  Neck: No carotid bruits. JVD not elevated.  Lungs: Respirations regular and unlabored, without wheezes or rales.  Heart: Regular rate and rhythm. No S3 or S4.  No murmur, no rubs, or gallops appreciated. Abdomen: Soft, non-tender, non-distended with normoactive bowel sounds. No hepatomegaly. No rebound/guarding. No obvious abdominal masses. Msk:  Strength and tone appear normal for age. No joint deformities or effusions. Extremities: No clubbing or cyanosis. No edema.  Distal pedal pulses are 2+ bilaterally. Radial site appears stable with no evidence of a hematoma.  Neuro: Alert and oriented X 3. Moves all extremities spontaneously. No  focal deficits noted. Psych:  Responds to questions appropriately with a normal affect. Skin: No rashes or lesions noted  Wt Readings from Last 3 Encounters:  04/13/18 300 lb (136.1 kg)  03/15/18 (P) 298 lb (135.2 kg)  03/11/18 298 lb (135.2 kg)    Studies/Labs Reviewed:   EKG:  EKG is not ordered today.  Recent Labs: 03/15/2018: Hemoglobin 14.7; Platelets 250 03/23/2018: ALT 36; BUN 25; Creat 1.28; Potassium 4.5; Sodium 134; TSH 2.97   Lipid Panel    Component Value Date/Time   CHOL 199 03/23/2018 0957   TRIG 384 (H) 03/23/2018 0957   HDL 54 03/23/2018 0957   CHOLHDL 3.7 03/23/2018 0957   LDLCALC 94 03/23/2018 0957    Additional studies/ records that were reviewed today include:   Echocardiogram: 02/19/2017 Study Conclusions  - Left ventricle: The cavity size was normal. Wall thickness was   increased in a pattern of mild LVH. Systolic function was normal.   The estimated ejection fraction was in the range of 60% to 65%.   Wall motion was normal; there were no regional wall motion   abnormalities. Doppler parameters are consistent with abnormal   left ventricular relaxation (grade 1 diastolic dysfunction). - Aortic valve: Probably trileaflet. Mean gradient (S): 3 mm Hg. - Tricuspid valve: There was trivial regurgitation. - Pulmonary arteries: Systolic pressure could not be accurately   estimated. - Pericardium, extracardiac: A prominent pericardial fat pad was   present.  Impressions:  - Mild LVH with LVEF 60-65% and grade 1 diastolic dysfunction.   Trivial tricuspid regurgitation, unable to assess PASP. Prominent   pericardial fat pad.  Cardiac Catheterization: 03/15/2018   Mid LM to Dist LM lesion is 20% stenosed.  Prox Cx (Zomax Study DES STENT) is 20% stenosed.  The left ventricular systolic function is normal. The left ventricular ejection fraction is 55-65% by visual estimate.  LV end diastolic pressure is moderately elevated.   Widely patent  circumflex stent with minimal disease elsewhere.   Moderately elevated LVEDP  Plan:  Discharge home after bed rest. Anticipate  medication adjustments with antihypertensives and possibly diuretic.  Assessment:    1. Coronary artery disease involving native coronary artery of native heart with other form of angina pectoris ([place])   2. Essential hypertension   3. Hyperlipidemia LDL goal <70   4. OSA (obstructive sleep apnea)      Plan:   In order of problems listed above:  1. CAD - s/p DES to OM in 2005. Recent catheterization in 02/2018 showed 20% mid to distal LM stenosis with 20% ISR of the previously placed LCx stent. EF was preserved at 55 to 65% but LVEDP was moderately elevated.   - he reports breathing has improved since the time of his cath but is not back to baseline. Reports a 30 pound weight gain within the past 4 months which has been gradual as he reports worsening dietary habits in the setting of increased stress. Given his elevated LVEDP by recent cath, will further titrate Amlodipine for improved BP control as outlined below. He has been on Losartan-HCTZ for years and is hesitant to make changes to this. Will provide with Rx for Lasix 20mg  to take as needed for weight gain > 3 lbs overnight or > 5 lbs in one week. If having to take this more than 3 times per week, he will contact the office as we would need to discontinue HCTZ and start Lasix 20mg  daily with plans for a repeat BMET in 2 weeks.  - I imagine his weight gain and deconditioning is playing a significant role in his symptoms. He strongly dislikes going to the [facility] and does not exercise outdoors due to the heat. Recommended he consider the [employer] classes at [facility] in an effort to increase his activity. The patient and his wife seemed very optimistic about this opportunity.   2. HTN - BP is at 136/72 when checked at today's visit. Reports this has been elevated at times when checked at home. Will increase to  10mg  daily. Continue Lopressor and Losartan-HCTZ at current dosing. I recommended he continue to follow BP in the ambulatory setting.  3. HLD - followed by PCP. Goal LDL is < 70 with known CAD. LDL previously 79 when checked 7 months ago, increased to 94 on most recent labs.  - has been intolerant to multiple statins. Remains on Zetia.   4. OSA - continued compliance with CPAP encouraged.   Medication Adjustments/Labs and Tests Ordered: Current medicines are reviewed at length with the patient today.  Concerns regarding medicines are outlined above.  Medication changes, Labs and Tests ordered today are listed in the Patient Instructions below. Patient Instructions  Your physician recommends that you schedule a follow-up appointment in: 2 months with Dr.Koneswaran   INCREASE Amlodipine to 5 mg twice a day   Limit daily fluid intake to less than 2 Liters per day.  Please limit salt intake.  Please weight yourself every morning. Take a Lasix  20 mg tablet if weight increases by 3 pounds overnight or 5 pounds in a single week.If you If you have to use Lasix more than 3 times in a week, call our office  Please investigate " [place]" exercise classes here at [facility], you have the brochure.   If you need a refill on your cardiac medications before your next appointment, please call your pharmacy.  No lab work or tests ordered today.  Thank you for choosing [facility] !  Signed, Erma Heritage, PA-C  04/13/2018 6:47 PM    [place] [address] [place], [place] [address] Phone: [phone]

## 2018-04-13 ENCOUNTER — Ambulatory Visit: Payer: Medicare HMO | Admitting: Student

## 2018-04-13 ENCOUNTER — Encounter: Payer: Self-pay | Admitting: Student

## 2018-04-13 VITALS — BP 136/72 | HR 88 | Ht 70.0 in | Wt 300.0 lb

## 2018-04-13 DIAGNOSIS — E782 Mixed hyperlipidemia: Secondary | ICD-10-CM | POA: Diagnosis not present

## 2018-04-13 DIAGNOSIS — G4733 Obstructive sleep apnea (adult) (pediatric): Secondary | ICD-10-CM

## 2018-04-13 DIAGNOSIS — I25118 Atherosclerotic heart disease of native coronary artery with other forms of angina pectoris: Secondary | ICD-10-CM

## 2018-04-13 DIAGNOSIS — E785 Hyperlipidemia, unspecified: Secondary | ICD-10-CM | POA: Diagnosis not present

## 2018-04-13 DIAGNOSIS — I10 Essential (primary) hypertension: Secondary | ICD-10-CM | POA: Diagnosis not present

## 2018-04-13 MED ORDER — AMLODIPINE BESYLATE 5 MG PO TABS: 5.0000 mg | ORAL_TABLET | Freq: Two times a day (BID) | ORAL | 3 refills | Status: DC

## 2018-04-13 MED ORDER — FUROSEMIDE 20 MG PO TABS: ORAL_TABLET | ORAL | 3 refills | Status: DC

## 2018-04-13 NOTE — Patient Instructions (Addendum)
Your physician recommends that you schedule a follow-up appointment in: 2 months with Dr.Koneswaran   INCREASE Amlodipine to 5 mg twice a day   Limit daily fluid intake to less than 2 Liters per day.   Please limit salt intake.  Please weight yourself every morning. Take a Lasix  20 mg tablet if weight increases by 3 pounds overnight or 5 pounds in a single week.If you If you have to use Lasix more than 3 times in a week, call our office   Please investigate " [place]" exercise classes her at [facility], you have the brochure       If you need a refill on your cardiac medications before your next appointment, please call your pharmacy.   No lab work or tests ordered today.       Thank you for choosing [facility] !

## 2018-04-20 ENCOUNTER — Other Ambulatory Visit: Payer: Self-pay | Admitting: "Endocrinology

## 2018-04-23 ENCOUNTER — Encounter: Payer: Self-pay | Admitting: "Endocrinology

## 2018-04-23 ENCOUNTER — Ambulatory Visit (INDEPENDENT_AMBULATORY_CARE_PROVIDER_SITE_OTHER): Payer: Medicare HMO | Admitting: "Endocrinology

## 2018-04-23 ENCOUNTER — Telehealth: Payer: Self-pay

## 2018-04-23 VITALS — BP 139/86 | HR 61 | Ht 70.0 in | Wt 302.0 lb

## 2018-04-23 DIAGNOSIS — E782 Mixed hyperlipidemia: Secondary | ICD-10-CM

## 2018-04-23 DIAGNOSIS — E039 Hypothyroidism, unspecified: Secondary | ICD-10-CM

## 2018-04-23 DIAGNOSIS — E1159 Type 2 diabetes mellitus with other circulatory complications: Secondary | ICD-10-CM | POA: Diagnosis not present

## 2018-04-23 DIAGNOSIS — E221 Hyperprolactinemia: Secondary | ICD-10-CM | POA: Diagnosis not present

## 2018-04-23 DIAGNOSIS — I10 Essential (primary) hypertension: Secondary | ICD-10-CM | POA: Diagnosis not present

## 2018-04-23 MED ORDER — ICOSAPENT ETHYL 1 G PO CAPS: 2.0000 | ORAL_CAPSULE | Freq: Two times a day (BID) | ORAL | 6 refills | Status: DC

## 2018-04-23 NOTE — Progress Notes (Signed)
Subjective:    Patient ID: Brandon Warren, male    DOB: 18-Jan-1951, PCP [employer], MD   Past Medical History:  Diagnosis Date  . Arthritis   . CAD S/P percutaneous coronary angioplasty    a. 2005: PCI OM - Zomax study stent DES  2.5 x16 mm. b. cath in 02/2018 showing 20% ISR of LCx stent and 20% stenosis along mid-distal LM.   Marland Kitchen Hypercholesterolemia   . Hypertension   . Hypothyroidism   . MI (myocardial infarction) ([place])    in his 35s   Past Surgical History:  Procedure Laterality Date  . APPENDECTOMY    . CATARACT EXTRACTION Left   . COLONOSCOPY N/A 02/01/2014   Procedure: COLONOSCOPY;  Surgeon: Daneil Dolin, MD;  Location: [facility];  Service: Endoscopy;  Laterality: N/A;  8:30  . CORONARY ANGIOPLASTY WITH STENT PLACEMENT  2005   PCI- OM1 Zomax study stent 2.5 x16 mm.  Marland Kitchen LEFT HEART CATH AND CORONARY ANGIOGRAPHY N/A 03/15/2018   Procedure: LEFT HEART CATH AND CORONARY ANGIOGRAPHY;  Surgeon: Leonie Man, MD;  Location: [facility];  Service: Cardiovascular;  Laterality: N/A;   Social History   Socioeconomic History  . Marital status: Married    Spouse name: Not on file  . Number of children: Not on file  . Years of education: Not on file  . Highest education level: Not on file  Occupational History  . Occupation: self-employed  Social Needs  . Financial resource strain: Not on file  . Food insecurity:    Worry: Not on file    Inability: Not on file  . Transportation needs:    Medical: Not on file    Non-medical: Not on file  Tobacco Use  . Smoking status: Former Smoker    Packs/day: 2.00    Years: 30.00    Pack years: 60.00    Types: Cigarettes    Last attempt to quit: 02/02/2004    Years since quitting: 14.2  . Smokeless tobacco: Never Used  Substance and Sexual Activity  . Alcohol use: Yes    Comment: occasional  . Drug use: No  . Sexual activity: Yes    Birth control/protection: None  Lifestyle  . Physical activity:    Days per  week: Not on file    Minutes per session: Not on file  . Stress: Not on file  Relationships  . Social connections:    Talks on phone: Not on file    Gets together: Not on file    Attends religious service: Not on file    Active member of club or organization: Not on file    Attends meetings of clubs or organizations: Not on file    Relationship status: Not on file  Other Topics Concern  . Not on file  Social History Narrative  . Not on file   Outpatient Encounter Medications as of 04/23/2018  Medication Sig  . amLODipine (NORVASC) 5 MG tablet Take 1 tablet (5 mg total) by mouth 2 (two) times daily.  Marland Kitchen aspirin EC 81 MG tablet Take 81 mg by mouth every evening. Reported on 12/18/2015  . Cholecalciferol (VITAMIN D3) 5000 units CAPS Take 1 capsule (5,000 Units total) by mouth daily.  . diazepam (VALIUM) 10 MG tablet Take 10 mg by mouth every 6 (six) hours as needed for anxiety.  Marland Kitchen ezetimibe (ZETIA) 10 MG tablet Take 10 mg by mouth at bedtime.   . finasteride (PROSCAR) 5 MG  tablet Take 5 mg by mouth daily.   . furosemide (LASIX) 20 MG tablet Take 20 mg daily if you have more than 3 lbs weight gain in 24 hrs  . levothyroxine (SYNTHROID, LEVOTHROID) 200 MCG tablet Take 200 mcg by mouth daily before breakfast.  . levothyroxine (SYNTHROID, LEVOTHROID) 25 MCG tablet Take 25 mcg by mouth daily before breakfast.  . losartan-hydrochlorothiazide (HYZAAR) 100-25 MG tablet TAKE ONE (1) TABLET BY MOUTH EVERY DAY  . metFORMIN (GLUCOPHAGE) 500 MG tablet TAKE ONE TABLET BY MOUTH TWICE A DAY WITH A MEAL  . metoprolol tartrate (LOPRESSOR) 50 MG tablet TAKE ONE TABLET BY MOUTH TWICE A DAY  . tamsulosin (FLOMAX) 0.4 MG CAPS capsule Take 0.4 mg by mouth 2 (two) times daily.  Marland Kitchen acetaminophen (TYLENOL) 500 MG tablet Take 1,000 mg by mouth 2 (two) times daily as needed (for pain.).  Marland Kitchen ibuprofen (ADVIL,MOTRIN) 200 MG tablet Take 400 mg by mouth 2 (two) times daily as needed (for pain.).  Marland Kitchen Icosapent Ethyl  (VASCEPA) 1 g CAPS Take 2 capsules (2 g total) by mouth 2 (two) times daily with a meal.  . [DISCONTINUED] Cholecalciferol (VITAMIN D3) 2000 units TABS Take 2,000 Units by mouth daily.  . [DISCONTINUED] levothyroxine (SYNTHROID, LEVOTHROID) 200 MCG tablet TAKE ONE TABLET (200MCG TOTAL) BY MOUTH DAILY BEFORE BREAKFAST   No facility-administered encounter medications on file as of 04/23/2018.    ALLERGIES: Allergies  Allergen Reactions  . Meat Extract Diarrhea, Nausea Only and Other (See Comments)    alphagal reaction  . Lipitor [Atorvastatin] Other (See Comments)    Muscle cramps  . Simvastatin Other (See Comments)    Muscle cramps     VACCINATION STATUS:  There is no immunization history on file for this patient.  Diabetes  He presents for his follow-up diabetic visit. He has type 2 diabetes mellitus. Onset time: Being diagnosed at age 41 years. His disease course has been worsening. There are no hypoglycemic associated symptoms. There are no diabetic associated symptoms. There are no hypoglycemic complications. Symptoms are worsening. Diabetic complications include heart disease. Risk factors for coronary artery disease include diabetes mellitus, dyslipidemia, hypertension, obesity, sedentary lifestyle and tobacco exposure. When asked about current treatments, none were reported. His weight is increasing steadily. He is following a generally unhealthy diet. When asked about meal planning, he reported none. He has not had a previous visit with a dietitian. He rarely participates in exercise. An ACE inhibitor/angiotensin II receptor blocker is being taken.  Hypertension  This is a chronic problem. The current episode started more than 1 year ago. The problem is uncontrolled. Risk factors for coronary artery disease include diabetes mellitus, dyslipidemia, obesity, sedentary lifestyle, smoking/tobacco exposure and family history. Past treatments include angiotensin blockers and diuretics.  Hypertensive end-organ damage includes CAD/MI.  Hyperlipidemia  This is a chronic problem. The current episode started more than 1 year ago. The problem is uncontrolled. Exacerbating diseases include diabetes, hypothyroidism and obesity. Risk factors for coronary artery disease include diabetes mellitus, dyslipidemia, hypertension, male sex, obesity and a sedentary lifestyle.   Hyperprolactinemia -He denies any prior history of pituitary, adrenal dysfunction. - He was found to have high prolactin level of 19.1 (normal 4-15.2) on 02/26/2017, his repeat labs show normal prolactin of 8.6. - His most recent labs showed stable prolactin of 9.2 from 09/14/2017. -He is not on any antipsychotics or antidepressants.  Hypothyroidism -He is known to have hypothyroidism since age of 56 yrs currently on a large dose of levothyroxine at  250 g by mouth every morning. He reports family history of thyroid dysfunction in distant cousins. 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 - He fathers 2 grown children. - He went on ketogenic diet and lost 32 pounds since last visit, after he gained  approximately 50 pounds over the previous 6 years. -  He has history of coronary artery disease which required angioplasty in 1993 and 2005 - at age 50.  He has significant family history of coronary artery disease.  Review of Systems  Constitutional: + Weight gain ,   + fatigue, - subjective hyperthermia, no subjective hypothermia Eyes: no blurry vision, no xerophthalmia ENT: no sore throat, no nodules palpated in throat, no dysphagia/odynophagia, no hoarseness Cardiovascular: no Chest Pain, no Shortness of Breath, no palpitations, no leg swelling Respiratory: no cough, no SOB Gastrointestinal: no Nausea/Vomiting/Diarhhea Musculoskeletal: no muscle/joint aches Skin: no rashes Neurological: no tremors, no numbness, no tingling, no  dizziness Psychiatric: no depression, no anxiety  Objective:    BP 139/86   Pulse 61   Ht '5\' 10"'$  (1.778 m)   Wt (!) 302 lb (137 kg)   BMI 43.33 kg/m   Wt Readings from Last 3 Encounters:  04/23/18 (!) 302 lb (137 kg)  04/13/18 300 lb (136.1 kg)  03/15/18 (P) 298 lb (135.2 kg)    Physical Exam  Constitutional: + Obese with a BMI of 43,  not in acute distress, normal state of mind Eyes: PERRLA, EOMI, no exophthalmos ENT: moist mucous membranes, no thyromegaly, no cervical lymphadenopathy Musculoskeletal: no gross deformities, strength intact in all four extremities Skin: moist, warm, no rashes Neurological: no tremor with outstretched hands, Deep tendon reflexes normal in all four extremities.  Recent Results (from the past 2160 hour(s))  Glucose, capillary     Status: Abnormal   Collection Time: 03/15/18  7:46 AM  Result Value Ref Range   Glucose-Capillary 151 (H) 65 - 99 mg/dL   Comment 1 Notify RN   Basic metabolic panel     Status: Abnormal   Collection Time: 03/15/18  8:53 AM  Result Value Ref Range   Sodium 138 135 - 145 mmol/L   Potassium 4.0 3.5 - 5.1 mmol/L   Chloride 103 101 - 111 mmol/L   CO2 22 22 - 32 mmol/L   Glucose, Bld 151 (H) 65 - 99 mg/dL   BUN 19 6 - 20 mg/dL   Creatinine, Ser 1.26 (H) 0.61 - 1.24 mg/dL   Calcium 9.2 8.9 - 10.3 mg/dL   GFR calc non Af Amer 57 (L) >60 mL/min   GFR calc Af Amer >60 >60 mL/min    Comment: (NOTE) The eGFR has been calculated using the CKD EPI equation. This calculation has not been validated in all clinical situations. eGFR's persistently <60 mL/min signify possible Chronic Kidney Disease.    Anion gap 13 5 - 15    Comment: Performed at [facility] [address] [place], [place] [address]  CBC     Status: None   Collection Time: 03/15/18  8:53 AM  Result Value Ref Range   WBC 8.9 4.0 - 10.5 K/uL   RBC 5.25 4.22 - 5.81 MIL/uL   Hemoglobin 14.7 13.0 - 17.0 g/dL   HCT 44.7 39.0 - 52.0 %   MCV 85.1 78.0 -  100.0 fL   MCH 28.0 26.0 - 34.0 pg   MCHC 32.9 30.0 -  36.0 g/dL   RDW 13.8 11.5 - 15.5 %   Platelets 250 150 - 400 K/uL    Comment: Performed at [facility], [place] [address] [place], [place] [address]  COMPLETE METABOLIC PANEL WITH GFR     Status: Abnormal   Collection Time: 03/23/18  9:57 AM  Result Value Ref Range   Glucose, Bld 162 (H) 65 - 99 mg/dL    Comment: .            Fasting reference interval . For someone without known diabetes, a glucose value >125 mg/dL indicates that they may have diabetes and this should be confirmed with a follow-up test. .    BUN 25 7 - 25 mg/dL   Creat 1.28 (H) 0.70 - 1.25 mg/dL    Comment: For patients >18 years of age, the reference limit for Creatinine is approximately 13% higher for people identified as African-American. .    GFR, Est Non African American 58 (L) > OR = 60 mL/min/1.76m   GFR, Est African American 67 > OR = 60 mL/min/1.771m  BUN/Creatinine Ratio 20 6 - 22 (calc)   Sodium 134 (L) 135 - 146 mmol/L   Potassium 4.5 3.5 - 5.3 mmol/L   Chloride 99 98 - 110 mmol/L   CO2 22 20 - 32 mmol/L   Calcium 9.9 8.6 - 10.3 mg/dL   Total Protein 7.7 6.1 - 8.1 g/dL   Albumin 4.6 3.6 - 5.1 g/dL   Globulin 3.1 1.9 - 3.7 g/dL (calc)   AG Ratio 1.5 1.0 - 2.5 (calc)   Total Bilirubin 0.6 0.2 - 1.2 mg/dL   Alkaline phosphatase (APISO) 41 40 - 115 U/L   AST 30 10 - 35 U/L   ALT 36 9 - 46 U/L  Hemoglobin A1c     Status: Abnormal   Collection Time: 03/23/18  9:57 AM  Result Value Ref Range   Hgb A1c MFr Bld 6.6 (H) <5.7 % of total Hgb    Comment: For someone without known diabetes, a hemoglobin A1c value of 6.5% or greater indicates that they may have  diabetes and this should be confirmed with a follow-up  test. . For someone with known diabetes, a value <7% indicates  that their diabetes is well controlled and a value  greater than or equal to 7% indicates suboptimal  control. A1c targets should be individualized based on   duration of diabetes, age, comorbid conditions, and  other considerations. . Currently, no consensus exists regarding use of hemoglobin A1c for diagnosis of diabetes for children. .    Mean Plasma Glucose 143 (calc)   eAG (mmol/L) 7.9 (calc)  TSH     Status: None   Collection Time: 03/23/18  9:57 AM  Result Value Ref Range   TSH 2.97 0.40 - 4.50 mIU/L  T4, free     Status: None   Collection Time: 03/23/18  9:57 AM  Result Value Ref Range   Free T4 1.3 0.8 - 1.8 ng/dL  VITAMIN D 25 Hydroxy (Vit-D Deficiency, Fractures)     Status: None   Collection Time: 03/23/18  9:57 AM  Result Value Ref Range   Vit D, 25-Hydroxy 39 30 - 100 ng/mL    Comment: Vitamin D Status         25-OH Vitamin D: . Deficiency:                    <20 ng/mL Insufficiency:  20 - 29 ng/mL Optimal:                 > or = 30 ng/mL . For 25-OH Vitamin D testing on patients on  D2-supplementation and patients for whom quantitation  of D2 and D3 fractions is required, the QuestAssureD(TM) 25-OH VIT D, (D2,D3), LC/MS/MS is recommended: order  code 458-265-1985 (patients >62yr). . For more information on this test, go to: http://education.questdiagnostics.com/faq/FAQ163 (This link is being provided for  informational/educational purposes only.)   Lipid panel     Status: Abnormal   Collection Time: 03/23/18  9:57 AM  Result Value Ref Range   Cholesterol 199 <200 mg/dL   HDL 54 >40 mg/dL   Triglycerides 384 (H) <150 mg/dL   LDL Cholesterol (Calc) 94 mg/dL (calc)    Comment: Reference range: <100 . Desirable range <100 mg/dL for primary prevention;   <70 mg/dL for patients with CHD or diabetic patients  with > or = 2 CHD risk factors. .Marland KitchenLDL-C is now calculated using the Martin-Hopkins  calculation, which is a validated novel method providing  better accuracy than the Friedewald equation in the  estimation of LDL-C.  MCresenciano Genreet al. JAnnamaria Helling [id number];876(81: [id number]   (http://education.[facility].com/faq/FAQ164)    Total CHOL/HDL Ratio 3.7 <5.0 (calc)   Non-HDL Cholesterol (Calc) 145 (H) <130 mg/dL (calc)    Comment: For patients with diabetes plus 1 major ASCVD risk  factor, treating to a non-HDL-C goal of <100 mg/dL  (LDL-C of <70 mg/dL) is considered a therapeutic  option.     CMP     Component Value Date/Time   NA 134 (L) 03/23/2018 0957   K 4.5 03/23/2018 0957   CL 99 03/23/2018 0957   CO2 22 03/23/2018 0957   GLUCOSE 162 (H) 03/23/2018 0957   BUN 25 03/23/2018 0957   CREATININE 1.28 (H) 03/23/2018 0957   CALCIUM 9.9 03/23/2018 0957   PROT 7.7 03/23/2018 0957   AST 30 03/23/2018 0957   ALT 36 03/23/2018 0957   BILITOT 0.6 03/23/2018 0957   GFRNONAA 58 (L) 03/23/2018 0957   GFRAA 67 03/23/2018 0957    02/26/2017 labs show: LH 5.3, total testosterone 192 TSH 0.66, prolactin 19.1 ( 4-15.2), estradiol 20.1       Assessment & Plan:  1) type 2 diabetes. - He presents with higher A1c of 6.6% from 5.9%.  -This is an acceptable A1c, however his diabetes is  complicated by coronary artery disease which required angioplasty, obesity/sedentary life, hypertension which is uncontrolled, hyperlipidemia and patient remains at a high risk for more acute and chronic complications of diabetes which include CAD, CVA, CKD, retinopathy, and neuropathy. These are all discussed in detail with the patient.  - I have re-counseled the patient on diet management and  weight loss  by adopting a carbohydrate restricted / protein rich  Diet.  -  Suggestion is made for him to avoid simple carbohydrates  from his diet including Cakes, Sweet Desserts / Pastries, Ice Cream, Soda (diet and regular), Sweet Tea, Candies, Chips, Cookies, Store Bought Juices, Alcohol in Excess of  1-2 drinks a day, Artificial Sweeteners, and "Sugar-free" Products. This will help patient to have stable blood glucose profile and potentially avoid unintended weight gain.  - Patient  is advised to stick to a routine mealtimes to eat 3 meals  a day and avoid unnecessary snacks .  - I have approached patient with the following individualized plan to manage diabetes and patient agrees.  -Will not  need additional treatment at this time, I advised him to continue metformin 500 mg p.o. twice daily- after breakfast and supper.   -Side effects and precautions discussed with him.     - Patient specific target  for A1c; LDL, HDL, Triglycerides, and  Waist Circumference were discussed in detail.  2) BP/HTN: His blood pressure is controlled to target.   - I advised him to continue Losartan /HCTZ 100/25 mg by mouth daily, metoprolol 50 mg p.o. daily, and amlodipine 10 mg p.o. daily.    3) Lipids/HPL: Recent lipid panel shows significant hypertriglyceridemia of 384 increasing from 106. This could be due to his recent engagement with ketogenic diet. - I have advised him to continue Zetia 10 mg by mouth daily, and added Vascepa 2 g p.o. twice daily with meals.  4)  Morbid obesity ([place]) -He has reversed the weight loss he achieved due to ketogenic diet which caused rather rapid weight loss of 32 pounds over 3 weeks.  Of time.  -He was advised about this risk of rebound weight gain since this has happened relatively too fast.  - Heavy weight is still   his biggest risk factor for more cardiovascular morbidity and mortality. In light of the fact that he did have premature coronary artery disease at age 19,  and type 2 diabetes he will benefit the most from weight loss.  - I have counseled him on diet management  by adopting a carbohydrate restricted/protein rich diet. - He advised to pick up moderate physical activity by walking 30-60 minutes at least 4 days a week. - I advised him he would benefit more from more balanced intake of micronutrients more so than from ketogenic diet.   5. Hyperprolactinemia ([place]) - His repeat labs show prolactin within normal range at 9.2, progressively  improving from 19.1.  etiology unclear at this time. Patient is not on antipsychotics nor antidepressants. He will not need any intervention at this time. 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 He is not suitable candidate for testosterone replacement. - Suspicion for significant pituitary neoplasm is low at this time, hence we will defer imaging of pituitary/sella.  6. Hypothyroidism:  - He seems to have well settled diagnosis of hypothyroidism since age 42.  -  Based  on his recent thyroid function test, the current doses of levothyroxine is the right dose. - I advised him to continue levothyroxine to 25 mcg p.o. daily before breakfast.   - - We discussed about correct intake of levothyroxine, at fasting, with water, separated by at least 30 minutes from breakfast, and separated by more than 4 hours from calcium, iron, multivitamins, acid reflux medications (PPIs). -Patient is made aware of the fact that thyroid hormone replacement is needed for life, dose to be adjusted by periodic monitoring of thyroid function tests.     7) vitamin D deficiency: Responded very well - I advised him to continue  vitamin D 3 5000 units daily for the next 90 days.   8) Chronic Care/Health Maintenance:  -Patient is on ACEI/ARB and not on Statin medications due to intolerance and encouraged to continue to follow up with Ophthalmology, Podiatrist at least yearly or according to recommendations, and advised to  stay away from smoking. I have recommended yearly flu vaccine and pneumonia vaccination at least every 5 years; moderate intensity exercise for up to 150 minutes weekly; and  sleep for at least 7 hours a day.   - I advised patient  to maintain close follow up with [employer], MD for primary care needs.  - Time spent with the patient: 25 min, of which >50% was spent in reviewing his  current and  previous labs, previous treatments, and medications doses and  developing a plan for long-term care.  Brandon Warren participated in the discussions, expressed understanding, and voiced agreement with the above plans.  All questions were answered to his satisfaction. he is encouraged to contact clinic should he have any questions or concerns prior to his return visit.  Follow up plan: Return in about 6 months (around 10/23/2018) for follow up with pre-visit labs.  Glade Lloyd, MD Phone: [phone]  Fax: [phone]  -  This note was partially dictated with voice recognition software. Similar sounding words can be transcribed inadequately or may not  be corrected upon review.  04/23/2018, 7:15 PM

## 2018-04-23 NOTE — Telephone Encounter (Signed)
Notified [facility] and they will run Lovaza. If too expensive they will advise to take the Fish oil

## 2018-04-23 NOTE — Telephone Encounter (Signed)
Alternative is Lovaza , if that is cheaper. If not , he stays on store brand  fish oil 2 caps twice a day.

## 2018-04-23 NOTE — Telephone Encounter (Signed)
[facility] called wanting an alternative to Vascepa. His copay will be $275.

## 2018-04-23 NOTE — Patient Instructions (Signed)

## 2018-05-08 ENCOUNTER — Other Ambulatory Visit: Payer: Self-pay | Admitting: "Endocrinology

## 2018-05-11 ENCOUNTER — Other Ambulatory Visit: Payer: Self-pay | Admitting: "Endocrinology

## 2018-05-13 DIAGNOSIS — R351 Nocturia: Secondary | ICD-10-CM | POA: Diagnosis not present

## 2018-05-13 DIAGNOSIS — R972 Elevated prostate specific antigen [PSA]: Secondary | ICD-10-CM | POA: Diagnosis not present

## 2018-05-13 DIAGNOSIS — N401 Enlarged prostate with lower urinary tract symptoms: Secondary | ICD-10-CM | POA: Diagnosis not present

## 2018-06-07 DIAGNOSIS — R809 Proteinuria, unspecified: Secondary | ICD-10-CM | POA: Diagnosis not present

## 2018-06-07 DIAGNOSIS — I10 Essential (primary) hypertension: Secondary | ICD-10-CM | POA: Diagnosis not present

## 2018-06-07 DIAGNOSIS — Z79899 Other long term (current) drug therapy: Secondary | ICD-10-CM | POA: Diagnosis not present

## 2018-06-07 DIAGNOSIS — E559 Vitamin D deficiency, unspecified: Secondary | ICD-10-CM | POA: Diagnosis not present

## 2018-06-07 DIAGNOSIS — N183 Chronic kidney disease, stage 3 (moderate): Secondary | ICD-10-CM | POA: Diagnosis not present

## 2018-06-07 DIAGNOSIS — D509 Iron deficiency anemia, unspecified: Secondary | ICD-10-CM | POA: Diagnosis not present

## 2018-06-09 DIAGNOSIS — N183 Chronic kidney disease, stage 3 (moderate): Secondary | ICD-10-CM | POA: Diagnosis not present

## 2018-06-09 DIAGNOSIS — R809 Proteinuria, unspecified: Secondary | ICD-10-CM | POA: Diagnosis not present

## 2018-06-15 ENCOUNTER — Ambulatory Visit: Payer: Medicare HMO | Admitting: Cardiovascular Disease

## 2018-06-15 ENCOUNTER — Encounter: Payer: Self-pay | Admitting: Cardiovascular Disease

## 2018-06-15 VITALS — BP 144/66 | HR 83 | Ht 71.0 in | Wt 305.0 lb

## 2018-06-15 DIAGNOSIS — I10 Essential (primary) hypertension: Secondary | ICD-10-CM

## 2018-06-15 DIAGNOSIS — Z955 Presence of coronary angioplasty implant and graft: Secondary | ICD-10-CM | POA: Diagnosis not present

## 2018-06-15 DIAGNOSIS — E785 Hyperlipidemia, unspecified: Secondary | ICD-10-CM | POA: Diagnosis not present

## 2018-06-15 DIAGNOSIS — I25118 Atherosclerotic heart disease of native coronary artery with other forms of angina pectoris: Secondary | ICD-10-CM | POA: Diagnosis not present

## 2018-06-15 NOTE — Progress Notes (Signed)
SUBJECTIVE: The patient presents for routine follow-up.  He was evaluated in our office after cardiac catheterization on 04/13/2018 and due to elevated LVEDP, amlodipine was increased for improved blood pressure control.  He was also prescribed Lasix 20 mg to be taken as needed.  Coronary angiography on 03/15/2018 showed 20% stenosis of the mid to distal left main, 20% proximal circumflex stenosis with a widely patent stent.  He is here with his wife, Hildred Priest.  He said he feels much better and denies chest pain, palpitations, leg swelling, and shortness of breath has also improved.  He is going to begin taking exercise classes in the cardiac rehabilitation department twice per week and may add on a third day at the [facility].  He has modified his diet and eats chicken and fish and avoids fried food, baked goods, and sweets.  He does say he eats excess calories and tends to stress eat.  Soc Hx: Hisdaughter is a Music therapist. His son tried to commit suicide in 2017 and the patient has experienced depression since that time.  Review of Systems: As per "subjective", otherwise negative.  Allergies  Allergen Reactions  . Meat Extract Diarrhea, Nausea Only and Other (See Comments)    alphagal reaction  . Lipitor [Atorvastatin] Other (See Comments)    Muscle cramps  . Simvastatin Other (See Comments)    Muscle cramps     Current Outpatient Medications  Medication Sig Dispense Refill  . acetaminophen (TYLENOL) 500 MG tablet Take 1,000 mg by mouth 2 (two) times daily as needed (for pain.).    Marland Kitchen amLODipine (NORVASC) 5 MG tablet Take 5 mg by mouth as directed. Takes 10 mg at night    . aspirin EC 81 MG tablet Take 81 mg by mouth every evening. Reported on 12/18/2015    . Cholecalciferol (VITAMIN D3) 5000 units CAPS Take 1 capsule (5,000 Units total) by mouth daily. 90 capsule 0  . diazepam (VALIUM) 10 MG tablet Take 10 mg by mouth every 6 (six) hours as needed for anxiety.    Marland Kitchen ezetimibe  (ZETIA) 10 MG tablet Take 10 mg by mouth at bedtime.     . finasteride (PROSCAR) 5 MG tablet Take 5 mg by mouth daily.     . furosemide (LASIX) 20 MG tablet Take 20 mg daily if you have more than 3 lbs weight gain in 24 hrs 30 tablet 3  . ibuprofen (ADVIL,MOTRIN) 200 MG tablet Take 400 mg by mouth 2 (two) times daily as needed (for pain.).    Marland Kitchen Icosapent Ethyl (VASCEPA) 1 g CAPS Take 2 capsules (2 g total) by mouth 2 (two) times daily with a meal. 120 capsule 6  . levothyroxine (SYNTHROID, LEVOTHROID) 200 MCG tablet Take 200 mcg by mouth daily before breakfast.    . levothyroxine (SYNTHROID, LEVOTHROID) 25 MCG tablet Take 25 mcg by mouth daily before breakfast.    . losartan-hydrochlorothiazide (HYZAAR) 100-25 MG tablet TAKE ONE (1) TABLET BY MOUTH EVERY DAY 90 tablet 0  . metFORMIN (GLUCOPHAGE) 500 MG tablet TAKE ONE TABLET BY MOUTH TWICE A DAY WITH A MEAL 180 tablet 0  . metoprolol tartrate (LOPRESSOR) 50 MG tablet TAKE ONE TABLET BY MOUTH TWICE A DAY 180 tablet 0  . tamsulosin (FLOMAX) 0.4 MG CAPS capsule Take 0.4 mg by mouth 2 (two) times daily.     No current facility-administered medications for this visit.     Past Medical History:  Diagnosis Date  . Arthritis   .  CAD S/P percutaneous coronary angioplasty    a. 2005: PCI OM - Zomax study stent DES  2.5 x16 mm. b. cath in 02/2018 showing 20% ISR of LCx stent and 20% stenosis along mid-distal LM.   Marland Kitchen Hypercholesterolemia   . Hypertension   . Hypothyroidism   . MI (myocardial infarction) ([place])    in his 56s    Past Surgical History:  Procedure Laterality Date  . APPENDECTOMY    . CATARACT EXTRACTION Left   . COLONOSCOPY N/A 02/01/2014   Procedure: COLONOSCOPY;  Surgeon: Daneil Dolin, MD;  [facility];  Service: Endoscopy;  Laterality: N/A;  8:30  . CORONARY ANGIOPLASTY WITH STENT PLACEMENT  2005   PCI- OM1 Zomax study stent 2.5 x16 mm.  Marland Kitchen LEFT HEART CATH AND CORONARY ANGIOGRAPHY N/A 03/15/2018   Procedure: LEFT  HEART CATH AND CORONARY ANGIOGRAPHY;  Surgeon: Leonie Man, MD;  Location: [facility];  Service: Cardiovascular;  Laterality: N/A;    Social History   Socioeconomic History  . Marital status: Married    Spouse name: Not on file  . Number of children: Not on file  . Years of education: Not on file  . Highest education level: Not on file  Occupational History  . Occupation: self-employed  Social Needs  . Financial resource strain: Not on file  . Food insecurity:    Worry: Not on file    Inability: Not on file  . Transportation needs:    Medical: Not on file    Non-medical: Not on file  Tobacco Use  . Smoking status: Former Smoker    Packs/day: 2.00    Years: 30.00    Pack years: 60.00    Types: Cigarettes    Last attempt to quit: 02/02/2004    Years since quitting: 14.3  . Smokeless tobacco: Never Used  Substance and Sexual Activity  . Alcohol use: Yes    Comment: occasional  . Drug use: No  . Sexual activity: Yes    Birth control/protection: None  Lifestyle  . Physical activity:    Days per week: Not on file    Minutes per session: Not on file  . Stress: Not on file  Relationships  . Social connections:    Talks on phone: Not on file    Gets together: Not on file    Attends religious service: Not on file    Active member of club or organization: Not on file    Attends meetings of clubs or organizations: Not on file    Relationship status: Not on file  . Intimate partner violence:    Fear of current or ex partner: Not on file    Emotionally abused: Not on file    Physically abused: Not on file    Forced sexual activity: Not on file  Other Topics Concern  . Not on file  Social History Narrative  . Not on file     Vitals:   06/15/18 1526  BP: (!) 144/66  Pulse: 83  SpO2: 95%  Weight: (!) 305 lb (138.3 kg)  Height: 5\' 11"  (1.803 m)    Wt Readings from Last 3 Encounters:  06/15/18 (!) 305 lb (138.3 kg)  04/23/18 (!) 302 lb (137 kg)    04/13/18 300 lb (136.1 kg)     PHYSICAL EXAM General: NAD HEENT: Normal. Neck: No JVD, no thyromegaly. Lungs: Clear to auscultation bilaterally with normal respiratory effort. CV: Regular rate and rhythm, normal S1/S2, no S3/S4,  no murmur. No pretibial or periankle edema.  Abdomen: Firm, obese.  Neurologic: Alert and oriented.  Psych: Normal affect. Skin: Normal. Musculoskeletal: No gross deformities.    ECG: Reviewed above under Subjective   Labs: Lab Results  Component Value Date/Time   K 4.5 03/23/2018 09:57 AM   BUN 25 03/23/2018 09:57 AM   CREATININE 1.28 (H) 03/23/2018 09:57 AM   ALT 36 03/23/2018 09:57 AM   TSH 2.97 03/23/2018 09:57 AM   HGB 14.7 03/15/2018 08:53 AM     Lipids: Lab Results  Component Value Date/Time   LDLCALC 94 03/23/2018 09:57 AM   CHOL 199 03/23/2018 09:57 AM   TRIG 384 (H) 03/23/2018 09:57 AM   HDL 54 03/23/2018 09:57 AM       ASSESSMENT AND PLAN:  1.  Coronary disease: Symptomatically stable with widely patent left circumflex stent by coronary angiogram in May 2019.  Continue aspirin, Zetia and metoprolol.  He has been intolerant of statins.  Enrolling in an exercise program at [facility] was recommended at the time of his last office visit.  He has done so and begins tomorrow and will be taking 2 classes per week.    2.  Hypertension: Blood pressure is mildly elevated.  He is on amlodipine 10 mg and Hyzaar 100-25 mg daily.  I recommend physical activity in the form of an exercise regimen for weight loss to further reduce blood pressure which he begins tomorrow.  3.  Hyperlipidemia: Continue Zetia.  LDL 94 on 03/23/2018.  Statin intolerant.  4.  Morbid obesity: Needs significant weight loss.  He has enrolled in an exercise program in the cardiac rehabilitation department.   Disposition: Follow up 1 year   Kate Sable, M.D., F.A.C.C.

## 2018-06-15 NOTE — Patient Instructions (Signed)
Your physician wants you to follow-up in: 1 year with Dr.Koneswaran You will receive a reminder letter in the mail two months in advance. If you don't receive a letter, please call our office to schedule the follow-up appointment.      Your physician recommends that you continue on your current medications as directed. Please refer to the Current Medication list given to you today.     If you need a refill on your cardiac medications before your next appointment, please call your pharmacy.      No labs or tests today.      Thank you for choosing [facility] !

## 2018-06-17 DIAGNOSIS — G4733 Obstructive sleep apnea (adult) (pediatric): Secondary | ICD-10-CM | POA: Diagnosis not present

## 2018-06-24 ENCOUNTER — Telehealth: Payer: Self-pay

## 2018-06-24 MED ORDER — CARVEDILOL 12.5 MG PO TABS: 12.5000 mg | ORAL_TABLET | Freq: Two times a day (BID) | ORAL | 3 refills | Status: DC

## 2018-06-24 NOTE — Telephone Encounter (Signed)
Pt notified , coreg e-scribed to pharmacy

## 2018-06-24 NOTE — Telephone Encounter (Signed)
-----   Message from Herminio Commons, MD sent at 06/22/2018  5:00 PM EDT ----- Regarding: RE: Patient in Maintenance Class Let's switch metoprolol to carvedilol 12.5 mg bid to see if this will help to reduce BP.  ----- Message ----- From: Gean Maidens Sent: 06/22/2018   4:05 PM EDT To: Herminio Commons, MD, Dwana Melena, RN Subject: Patient in Maintenance Class                   Dr. Bronson Ing,  Your patient was suggested to join our maintenance program by [place], [place]. He has just started and his BP's are very elevated. He has only been to 2 visits and his readings are as follows: at rest 152/72 and 160/80. At the end of exercise and sitting resting 5-10 minutes 142/72 and 152/88. It concerns me that his BP's are elevated at rest and being maintenance is a non-monitored program. Please let me know what you think, should we let him continue watching his BP trend, does he need to continue. Please advise. Thanks so much [employer]

## 2018-06-29 DIAGNOSIS — Z6839 Body mass index (BMI) 39.0-39.9, adult: Secondary | ICD-10-CM | POA: Diagnosis not present

## 2018-06-29 DIAGNOSIS — Z0001 Encounter for general adult medical examination with abnormal findings: Secondary | ICD-10-CM | POA: Diagnosis not present

## 2018-06-29 DIAGNOSIS — Z1389 Encounter for screening for other disorder: Secondary | ICD-10-CM | POA: Diagnosis not present

## 2018-06-29 DIAGNOSIS — Z23 Encounter for immunization: Secondary | ICD-10-CM | POA: Diagnosis not present

## 2018-07-01 ENCOUNTER — Telehealth: Payer: Self-pay | Admitting: Cardiovascular Disease

## 2018-07-01 NOTE — Telephone Encounter (Signed)
Pt lvm stating his BP this morning is 156/100 since starting his Carvediolol and would like to know what he should do.  Please give him a call @ [phone]

## 2018-07-01 NOTE — Telephone Encounter (Signed)
Noted-cc

## 2018-07-01 NOTE — Telephone Encounter (Signed)
Patient took BP just before leaving his house   Will repeat when he gets home and call me back

## 2018-07-01 NOTE — Telephone Encounter (Signed)
lvm for Brandon Warren stating that he got home and rested and put both feet on the floor and got a reading of 140/72  Will call back if anything changes

## 2018-07-07 ENCOUNTER — Telehealth: Payer: Self-pay | Admitting: Cardiovascular Disease

## 2018-07-07 DIAGNOSIS — Z79899 Other long term (current) drug therapy: Secondary | ICD-10-CM

## 2018-07-07 NOTE — Telephone Encounter (Signed)
LM with Dr.koneswaran's advice, lab slip at front desk, asked pt to call me back

## 2018-07-07 NOTE — Telephone Encounter (Signed)
Pt is having swelling in hands, feet and legs up to the knee

## 2018-07-07 NOTE — Telephone Encounter (Signed)
Likely due to dietary indiscretion. Have him weigh daily and document weights. Check BMET since he has been taking Lasix daily.

## 2018-07-07 NOTE — Telephone Encounter (Signed)
Wife called back and I read message

## 2018-07-07 NOTE — Telephone Encounter (Signed)
Pt traveling now, been out of town 4 days ago, swelling 3 days, unsure weight, told to fast by PA still eat what he wants though, taking lasix 20 mg 3 days in a row now

## 2018-07-12 DIAGNOSIS — M79675 Pain in left toe(s): Secondary | ICD-10-CM | POA: Diagnosis not present

## 2018-07-12 DIAGNOSIS — Z6838 Body mass index (BMI) 38.0-38.9, adult: Secondary | ICD-10-CM | POA: Diagnosis not present

## 2018-07-12 DIAGNOSIS — M1000 Idiopathic gout, unspecified site: Secondary | ICD-10-CM | POA: Diagnosis not present

## 2018-07-20 ENCOUNTER — Other Ambulatory Visit: Payer: Self-pay | Admitting: "Endocrinology

## 2018-08-11 ENCOUNTER — Other Ambulatory Visit: Payer: Self-pay | Admitting: "Endocrinology

## 2018-08-24 ENCOUNTER — Other Ambulatory Visit: Payer: Self-pay | Admitting: "Endocrinology

## 2018-10-23 ENCOUNTER — Other Ambulatory Visit: Payer: Self-pay | Admitting: "Endocrinology

## 2018-10-25 ENCOUNTER — Ambulatory Visit: Payer: Medicare HMO | Admitting: "Endocrinology

## 2018-10-25 ENCOUNTER — Other Ambulatory Visit: Payer: Self-pay | Admitting: "Endocrinology

## 2018-10-25 MED ORDER — LOSARTAN POTASSIUM 100 MG PO TABS: 100.0000 mg | ORAL_TABLET | Freq: Every day | ORAL | 0 refills | Status: DC

## 2018-10-25 MED ORDER — HYDROCHLOROTHIAZIDE 25 MG PO TABS: 25.0000 mg | ORAL_TABLET | Freq: Every day | ORAL | 0 refills | Status: DC

## 2018-11-15 ENCOUNTER — Other Ambulatory Visit: Payer: Self-pay | Admitting: Student

## 2019-01-19 ENCOUNTER — Other Ambulatory Visit: Payer: Self-pay | Admitting: "Endocrinology

## 2019-01-26 ENCOUNTER — Other Ambulatory Visit: Payer: Self-pay | Admitting: "Endocrinology

## 2019-01-28 DIAGNOSIS — G4733 Obstructive sleep apnea (adult) (pediatric): Secondary | ICD-10-CM | POA: Diagnosis not present

## 2019-02-15 ENCOUNTER — Other Ambulatory Visit: Payer: Self-pay | Admitting: "Endocrinology

## 2019-02-25 DIAGNOSIS — Z1389 Encounter for screening for other disorder: Secondary | ICD-10-CM | POA: Diagnosis not present

## 2019-02-25 DIAGNOSIS — E119 Type 2 diabetes mellitus without complications: Secondary | ICD-10-CM | POA: Diagnosis not present

## 2019-02-25 DIAGNOSIS — F419 Anxiety disorder, unspecified: Secondary | ICD-10-CM | POA: Diagnosis not present

## 2019-02-25 DIAGNOSIS — I10 Essential (primary) hypertension: Secondary | ICD-10-CM | POA: Diagnosis not present

## 2019-02-25 DIAGNOSIS — Z681 Body mass index (BMI) 19 or less, adult: Secondary | ICD-10-CM | POA: Diagnosis not present

## 2019-02-25 DIAGNOSIS — Z0001 Encounter for general adult medical examination with abnormal findings: Secondary | ICD-10-CM | POA: Diagnosis not present

## 2019-05-02 ENCOUNTER — Telehealth: Payer: Self-pay

## 2019-05-02 ENCOUNTER — Other Ambulatory Visit: Payer: Self-pay | Admitting: "Endocrinology

## 2019-05-02 DIAGNOSIS — E221 Hyperprolactinemia: Secondary | ICD-10-CM

## 2019-05-02 DIAGNOSIS — I10 Essential (primary) hypertension: Secondary | ICD-10-CM

## 2019-05-02 DIAGNOSIS — E1169 Type 2 diabetes mellitus with other specified complication: Secondary | ICD-10-CM

## 2019-05-02 DIAGNOSIS — I25119 Atherosclerotic heart disease of native coronary artery with unspecified angina pectoris: Secondary | ICD-10-CM

## 2019-05-02 DIAGNOSIS — E782 Mixed hyperlipidemia: Secondary | ICD-10-CM

## 2019-05-02 DIAGNOSIS — E1159 Type 2 diabetes mellitus with other circulatory complications: Secondary | ICD-10-CM

## 2019-05-02 DIAGNOSIS — E039 Hypothyroidism, unspecified: Secondary | ICD-10-CM

## 2019-05-02 MED ORDER — METFORMIN HCL 500 MG PO TABS: ORAL_TABLET | ORAL | 0 refills | Status: DC

## 2019-05-02 MED ORDER — LOSARTAN POTASSIUM 100 MG PO TABS: ORAL_TABLET | ORAL | 0 refills | Status: DC

## 2019-05-02 MED ORDER — OMEGA-3-ACID ETHYL ESTERS 1 G PO CAPS: ORAL_CAPSULE | ORAL | 0 refills | Status: AC

## 2019-05-02 NOTE — Telephone Encounter (Signed)
Brandon Warren, CMA  

## 2019-05-02 NOTE — Telephone Encounter (Signed)
LeighAnn Jasper Ruminski, CMA  

## 2019-05-11 ENCOUNTER — Telehealth: Payer: Medicare HMO | Admitting: "Endocrinology

## 2019-05-23 DIAGNOSIS — E7849 Other hyperlipidemia: Secondary | ICD-10-CM | POA: Diagnosis not present

## 2019-05-23 DIAGNOSIS — E119 Type 2 diabetes mellitus without complications: Secondary | ICD-10-CM | POA: Diagnosis not present

## 2019-05-23 DIAGNOSIS — I10 Essential (primary) hypertension: Secondary | ICD-10-CM | POA: Diagnosis not present

## 2019-05-23 DIAGNOSIS — L509 Urticaria, unspecified: Secondary | ICD-10-CM | POA: Diagnosis not present

## 2019-05-23 DIAGNOSIS — N401 Enlarged prostate with lower urinary tract symptoms: Secondary | ICD-10-CM | POA: Diagnosis not present

## 2019-05-23 DIAGNOSIS — E063 Autoimmune thyroiditis: Secondary | ICD-10-CM | POA: Diagnosis not present

## 2019-06-10 DIAGNOSIS — T50905A Adverse effect of unspecified drugs, medicaments and biological substances, initial encounter: Secondary | ICD-10-CM | POA: Diagnosis not present

## 2019-06-10 DIAGNOSIS — E119 Type 2 diabetes mellitus without complications: Secondary | ICD-10-CM | POA: Diagnosis not present

## 2019-06-10 DIAGNOSIS — N400 Enlarged prostate without lower urinary tract symptoms: Secondary | ICD-10-CM | POA: Diagnosis not present

## 2019-06-10 DIAGNOSIS — E7849 Other hyperlipidemia: Secondary | ICD-10-CM | POA: Diagnosis not present

## 2019-06-27 DIAGNOSIS — I10 Essential (primary) hypertension: Secondary | ICD-10-CM | POA: Diagnosis not present

## 2019-06-27 DIAGNOSIS — E119 Type 2 diabetes mellitus without complications: Secondary | ICD-10-CM | POA: Diagnosis not present

## 2019-06-27 DIAGNOSIS — E063 Autoimmune thyroiditis: Secondary | ICD-10-CM | POA: Diagnosis not present

## 2019-06-27 DIAGNOSIS — E782 Mixed hyperlipidemia: Secondary | ICD-10-CM | POA: Diagnosis not present

## 2019-06-28 DIAGNOSIS — Z23 Encounter for immunization: Secondary | ICD-10-CM | POA: Diagnosis not present

## 2019-07-06 ENCOUNTER — Other Ambulatory Visit: Payer: Self-pay | Admitting: Cardiovascular Disease

## 2019-07-13 ENCOUNTER — Ambulatory Visit: Payer: Medicare HMO | Admitting: Cardiovascular Disease

## 2019-07-27 DIAGNOSIS — I10 Essential (primary) hypertension: Secondary | ICD-10-CM | POA: Diagnosis not present

## 2019-07-27 DIAGNOSIS — N400 Enlarged prostate without lower urinary tract symptoms: Secondary | ICD-10-CM | POA: Diagnosis not present

## 2019-07-27 DIAGNOSIS — E119 Type 2 diabetes mellitus without complications: Secondary | ICD-10-CM | POA: Diagnosis not present

## 2019-07-27 DIAGNOSIS — E782 Mixed hyperlipidemia: Secondary | ICD-10-CM | POA: Diagnosis not present

## 2019-07-28 DIAGNOSIS — G4733 Obstructive sleep apnea (adult) (pediatric): Secondary | ICD-10-CM | POA: Diagnosis not present

## 2019-08-27 DIAGNOSIS — I10 Essential (primary) hypertension: Secondary | ICD-10-CM | POA: Diagnosis not present

## 2019-08-27 DIAGNOSIS — E1165 Type 2 diabetes mellitus with hyperglycemia: Secondary | ICD-10-CM | POA: Diagnosis not present

## 2019-08-27 DIAGNOSIS — E782 Mixed hyperlipidemia: Secondary | ICD-10-CM | POA: Diagnosis not present

## 2019-09-14 DIAGNOSIS — E063 Autoimmune thyroiditis: Secondary | ICD-10-CM | POA: Diagnosis not present

## 2019-10-27 DIAGNOSIS — E063 Autoimmune thyroiditis: Secondary | ICD-10-CM | POA: Diagnosis not present

## 2019-10-27 DIAGNOSIS — E119 Type 2 diabetes mellitus without complications: Secondary | ICD-10-CM | POA: Diagnosis not present

## 2019-10-27 DIAGNOSIS — I10 Essential (primary) hypertension: Secondary | ICD-10-CM | POA: Diagnosis not present

## 2019-10-27 DIAGNOSIS — I2510 Atherosclerotic heart disease of native coronary artery without angina pectoris: Secondary | ICD-10-CM | POA: Diagnosis not present

## 2019-11-01 DIAGNOSIS — E119 Type 2 diabetes mellitus without complications: Secondary | ICD-10-CM | POA: Diagnosis not present

## 2019-11-01 DIAGNOSIS — E063 Autoimmune thyroiditis: Secondary | ICD-10-CM | POA: Diagnosis not present

## 2019-11-01 DIAGNOSIS — Z91018 Allergy to other foods: Secondary | ICD-10-CM | POA: Diagnosis not present

## 2019-11-01 DIAGNOSIS — E1165 Type 2 diabetes mellitus with hyperglycemia: Secondary | ICD-10-CM | POA: Diagnosis not present

## 2019-11-02 ENCOUNTER — Other Ambulatory Visit: Payer: Self-pay | Admitting: Urology

## 2019-11-02 DIAGNOSIS — G4733 Obstructive sleep apnea (adult) (pediatric): Secondary | ICD-10-CM | POA: Diagnosis not present

## 2019-11-26 ENCOUNTER — Ambulatory Visit: Payer: Medicare HMO

## 2019-11-27 DIAGNOSIS — I10 Essential (primary) hypertension: Secondary | ICD-10-CM | POA: Diagnosis not present

## 2019-11-27 DIAGNOSIS — I2510 Atherosclerotic heart disease of native coronary artery without angina pectoris: Secondary | ICD-10-CM | POA: Diagnosis not present

## 2019-11-27 DIAGNOSIS — E119 Type 2 diabetes mellitus without complications: Secondary | ICD-10-CM | POA: Diagnosis not present

## 2019-11-27 DIAGNOSIS — E063 Autoimmune thyroiditis: Secondary | ICD-10-CM | POA: Diagnosis not present

## 2019-12-04 ENCOUNTER — Ambulatory Visit: Payer: Medicare HMO

## 2019-12-13 ENCOUNTER — Other Ambulatory Visit: Payer: Self-pay | Admitting: Cardiovascular Disease

## 2019-12-17 ENCOUNTER — Ambulatory Visit: Payer: Medicare HMO

## 2019-12-25 DIAGNOSIS — I10 Essential (primary) hypertension: Secondary | ICD-10-CM | POA: Diagnosis not present

## 2019-12-25 DIAGNOSIS — E063 Autoimmune thyroiditis: Secondary | ICD-10-CM | POA: Diagnosis not present

## 2019-12-25 DIAGNOSIS — E119 Type 2 diabetes mellitus without complications: Secondary | ICD-10-CM | POA: Diagnosis not present

## 2019-12-25 DIAGNOSIS — I2510 Atherosclerotic heart disease of native coronary artery without angina pectoris: Secondary | ICD-10-CM | POA: Diagnosis not present

## 2020-01-06 ENCOUNTER — Telehealth: Payer: Self-pay

## 2020-01-06 NOTE — Telephone Encounter (Signed)

## 2020-01-18 ENCOUNTER — Telehealth: Payer: Medicare HMO | Admitting: Cardiovascular Disease

## 2020-01-24 DIAGNOSIS — M25512 Pain in left shoulder: Secondary | ICD-10-CM | POA: Diagnosis not present

## 2020-01-24 DIAGNOSIS — M7542 Impingement syndrome of left shoulder: Secondary | ICD-10-CM | POA: Diagnosis not present

## 2020-01-25 ENCOUNTER — Other Ambulatory Visit: Payer: Self-pay

## 2020-01-25 ENCOUNTER — Ambulatory Visit (HOSPITAL_COMMUNITY): Payer: Medicare HMO | Attending: Orthopedic Surgery | Admitting: Occupational Therapy

## 2020-01-25 ENCOUNTER — Encounter (HOSPITAL_COMMUNITY): Payer: Self-pay | Admitting: Occupational Therapy

## 2020-01-25 DIAGNOSIS — M25512 Pain in left shoulder: Secondary | ICD-10-CM | POA: Diagnosis not present

## 2020-01-25 DIAGNOSIS — R29898 Other symptoms and signs involving the musculoskeletal system: Secondary | ICD-10-CM | POA: Diagnosis not present

## 2020-01-25 DIAGNOSIS — M25612 Stiffness of left shoulder, not elsewhere classified: Secondary | ICD-10-CM | POA: Insufficient documentation

## 2020-01-25 NOTE — Therapy (Signed)
[facility] [place], [place], [address] Phone: [phone]   Fax:  [phone]  Occupational Therapy Evaluation  Patient Details  Name: Brandon Warren MRN: [id number] Date of Birth: 1951/04/19 Referring Provider (OT): Dr. Netta Cedars   Encounter Date: 01/25/2020  OT End of Session - 01/25/20 1036    Visit Number  1    Number of Visits  8    Date for OT Re-Evaluation  02/24/20    Authorization Type  [employer]    Authorization Time Period  Requesting 8 visits 4/2-4/28    Progress Note Due on Visit  10    OT Start Time  0950    OT Stop Time  1031    OT Time Calculation (min)  41 min    Activity Tolerance  Patient tolerated treatment well    Behavior During Therapy  [facility] for tasks assessed/performed       Past Medical History:  Diagnosis Date  . Arthritis   . CAD S/P percutaneous coronary angioplasty    a. 2005: PCI OM - Zomax study stent DES  2.5 x16 mm. b. cath in 02/2018 showing 20% ISR of LCx stent and 20% stenosis along mid-distal LM.   Marland Kitchen Hypercholesterolemia   . Hypertension   . Hypothyroidism   . MI (myocardial infarction) ([place])    in his 37s    Past Surgical History:  Procedure Laterality Date  . APPENDECTOMY    . CATARACT EXTRACTION Left   . COLONOSCOPY N/A 02/01/2014   Procedure: COLONOSCOPY;  Surgeon: Daneil Dolin, MD;  [facility];  Service: Endoscopy;  Laterality: N/A;  8:30  . CORONARY ANGIOPLASTY WITH STENT PLACEMENT  2005   PCI- OM1 Zomax study stent 2.5 x16 mm.  Marland Kitchen LEFT HEART CATH AND CORONARY ANGIOGRAPHY N/A 03/15/2018   Procedure: LEFT HEART CATH AND CORONARY ANGIOGRAPHY;  Surgeon: Leonie Man, MD;  Location: [facility];  Service: Cardiovascular;  Laterality: N/A;    There were no vitals filed for this visit.  Subjective Assessment - 01/25/20 1033    Subjective   S: It's been hurting since September.    Pertinent History  Pt is a 69 y/o male presenting with  left shoulder pain present since September. Pt reports he leans on the left arm a lot and one day felt a sharp pain and has had pain ever since. Pt had a negative x-ray and received a cortisone shot on 01/24/20. Pt was referred to occupational therapy for evaluation and treatment by Dr. Netta Cedars.    Special Tests  FOTO: 48/100    Patient Stated Goals  To have less pain and be able to use my arm for daily tasks.    Currently in Pain?  Yes    Pain Score  3     Pain Location  Shoulder    Pain Orientation  Left    Pain Descriptors / Indicators  Aching;Sore    Pain Type  Acute pain    Pain Radiating Towards  N/A    Pain Onset  More than a month ago    Pain Frequency  Intermittent    Aggravating Factors   movement, use    Pain Relieving Factors  rest, hot shower    Effect of Pain on Daily Activities  mod to max effect on LUE use during ADLs    Multiple Pain Sites  No        [facility] OT Assessment -  01/25/20 0951      Assessment   Medical Diagnosis  Left shoulder pain    Referring Provider (OT)  Dr. Netta Cedars    Onset Date/Surgical Date  --   September 2021   Hand Dominance  Right    Next MD Visit  none scheduled    Prior Therapy  None       Precautions   Precautions  None      Restrictions   Weight Bearing Restrictions  No      Balance Screen   Has the patient fallen in the past 6 months  No    Has the patient had a decrease in activity level because of a fear of falling?   No    Is the patient reluctant to leave their home because of a fear of falling?   No      Prior Function   Level of Independence  Independent    Vocation  Works at home    [employer] work    Leisure  none      ADL   ADL comments  Pt is having difficulty with showering, shampooing hair, dressing tasks, reaching up and behind back. Difficulty with finding a comfortable sleep position.       Written Expression   Dominant Hand  Right      Cognition   Overall Cognitive Status   Within Functional Limits for tasks assessed      Observation/Other Assessments   Focus on Therapeutic Outcomes (FOTO)   48/100      ROM / Strength   AROM / PROM / Strength  AROM;Strength;PROM      Palpation   Palpation comment  mod fascial restrictions in left upper arm, anterior shoulder, and trapezius regions      AROM   Overall AROM Comments  Assessed seated, er/IR adducted    AROM Assessment Site  Shoulder    Right/Left Shoulder  Left    Left Shoulder Flexion  95 Degrees    Left Shoulder ABduction  56 Degrees    Left Shoulder Internal Rotation  90 Degrees    Left Shoulder External Rotation  38 Degrees      PROM   Overall PROM Comments  Assessed supine, er/IR adducted    PROM Assessment Site  Shoulder    Right/Left Shoulder  Left    Left Shoulder Flexion  112 Degrees    Left Shoulder ABduction  75 Degrees    Left Shoulder Internal Rotation  90 Degrees    Left Shoulder External Rotation  43 Degrees      Strength   Overall Strength Comments  Assessed seated, er/IR adducted    Strength Assessment Site  Shoulder    Right/Left Shoulder  Left    Left Shoulder Flexion  3-/5    Left Shoulder ABduction  3-/5    Left Shoulder Internal Rotation  3/5    Left Shoulder External Rotation  3-/5                      OT Education - 01/25/20 1006    Education Details  table slides, scapular A/ROM    Person(s) Educated  Patient    Methods  Explanation;Demonstration;Handout    Comprehension  Verbalized understanding;Returned demonstration       OT Short Term Goals - 01/25/20 1040      OT SHORT TERM GOAL #1   Title  Pt will be provided with and educated on  HEP to improve LUE mobility required for ADL completion.    Time  4    Period  Weeks    Status  New    Target Date  02/24/20      OT SHORT TERM GOAL #2   Title  Pt will decrease pain in LUE to 2/10 or less to improve ability to sleep without waking due to pain.    Time  4    Period  Weeks    Status  New       OT SHORT TERM GOAL #3   Title  Pt will increase A/ROM to [facility] to improve ability to perform dressing and bathing tasks.    Time  4    Period  Weeks    Status  New      OT SHORT TERM GOAL #4   Title  Pt will decrease LUE fascial restrictions to minimal amounts or less to improve mobility required for overhead reaching.    Time  4    Period  Weeks    Status  New      OT SHORT TERM GOAL #5   Title  Pt will increase LUE strength to 4/5 or greater to improve strength and activity tolerance required for washing hair.    Time  4    Period  Weeks    Status  New               Plan - 01/25/20 1037    Clinical Impression Statement  A: Pt is a 69 y/o male presenting with left shoulder pain onset in September. Pt experiencing pain, ROM and strength deficits limiting use of LUE during ADL and work task completion. Provided HEP and educated pt on completion. Pt is concerned about number of people in the waiting room and was provided appointments at a less crowded time and offered to let pt sit in car if uncomfortable. Pt agreeable to trial 4 weeks of therapy at this time.    OT Occupational Profile and History  Problem Focused Assessment - Including review of records relating to presenting problem    Occupational performance deficits (Please refer to evaluation for details):  ADL's;IADL's;Rest and Sleep;Work    Marketing executive / Function / Physical Skills  ADL;UE functional use;Fascial restriction;Pain;ROM;IADL;Strength    Rehab Potential  Good    Clinical Decision Making  Limited treatment options, no task modification necessary    Comorbidities Affecting Occupational Performance:  None    Modification or Assistance to Complete Evaluation   No modification of tasks or assist necessary to complete eval    OT Frequency  2x / week    OT Duration  4 weeks    OT Treatment/Interventions  Self-care/ADL training;Ultrasound;Patient/family education;Passive range of motion;Cryotherapy;Electrical  Stimulation;Moist Heat;Therapeutic exercise;Manual Therapy;Therapeutic activities    Plan  P: Pt will benefit from skilled OT services to decrease pain and fascial restrictions, increase ROM, strength, and LUE functional use during ADL completion. Treatment plan: myofascial release and manual techniques, P/ROM, AA/ROM, A/ROM, general LUE strengthening, scapular mobility & strengthening, modalities prn    OT Home Exercise Plan  3/31: table slides, scapular A/ROM    Consulted and Agree with Plan of Care  Patient       Patient will benefit from skilled therapeutic intervention in order to improve the following deficits and impairments:   Body Structure / Function / Physical Skills: ADL, UE functional use, Fascial restriction, Pain, ROM, IADL, Strength       Visit Diagnosis:  Acute pain of left shoulder  Stiffness of left shoulder, not elsewhere classified  Other symptoms and signs involving the musculoskeletal system    Problem List Patient Active Problem List   Diagnosis Date Noted  . Diabetes mellitus type 2 in obese ([place]) 05/21/2017  . Hyperprolactinemia ([place]) 05/12/2017  . Chest pain 04/20/2016  . Encounter for screening colonoscopy 01/24/2014  . Pseudoaphakia 06/08/2013  . H/O cataract extraction 05/25/2013  . Bradycardia 05/20/2013  . Dizziness 05/20/2013  . Coronary artery disease involving native coronary artery of native heart with angina pectoris ([place]) 05/19/2013  . Essential hypertension, benign 05/19/2013  . Hyperlipidemia 05/19/2013  . Hypothyroidism 05/19/2013  . Morbid obesity ([place]) 05/19/2013  . Amblyopia 05/02/2013  . 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 [place], [place], [address] Phone: [phone]   Fax:  [phone]  Name: Brandon Warren MRN: [id number] Date of Birth: Mar 12, 1951

## 2020-01-25 NOTE — Patient Instructions (Addendum)
1) SHOULDER: Flexion On Table   Place hands on towel placed on table, elbows straight. Lean forward with you upper body, pushing towel away from body.  _10__ reps per set, _3__ sets per day  2) Abduction (Passive)   With arm out to side, resting on towel placed on table with palm DOWN, keeping trunk away from table, lean to the side while pushing towel away from body.  Repeat __10__ times. Do __3__ sessions per day.  Copyright  VHI. All rights reserved.     3) Internal Rotation (Assistive)   Seated with elbow bent at right angle and held against side, slide arm on table surface in an inward arc keeping elbow anchored in place. Repeat _10___ times. Do __3__ sessions per day. Activity: Use this motion to brush crumbs off the table.   Copyright  VHI. All rights reserved.   4) Seated Row   Sit up straight with elbows by your sides. Pull back with shoulders/elbows, keeping forearms straight, as if pulling back on the reins of a horse. Squeeze shoulder blades together. Repeat _10-15__times, __2-3__sets/day    5) Shoulder Elevation    Sit up straight with arms by your sides. Slowly bring your shoulders up towards your ears. Repeat_10-15__times, __2-3__ sets/day    6) Shoulder Extension    Sit up straight with both arms by your side, draw your arms back behind your waist. Keep your elbows straight. Repeat __10-15__times, __2-3__sets/day.

## 2020-01-27 ENCOUNTER — Ambulatory Visit (HOSPITAL_COMMUNITY): Payer: Medicare HMO

## 2020-01-27 ENCOUNTER — Telehealth (HOSPITAL_COMMUNITY): Payer: Self-pay

## 2020-01-27 NOTE — Telephone Encounter (Signed)
lmonvm to cx this appt

## 2020-01-31 ENCOUNTER — Telehealth (HOSPITAL_COMMUNITY): Payer: Self-pay

## 2020-01-31 DIAGNOSIS — G4733 Obstructive sleep apnea (adult) (pediatric): Secondary | ICD-10-CM | POA: Diagnosis not present

## 2020-01-31 NOTE — Telephone Encounter (Signed)
Pt requested to be d/c - due to no temperture check at entrance

## 2020-02-01 ENCOUNTER — Ambulatory Visit (HOSPITAL_COMMUNITY): Payer: Medicare HMO

## 2020-02-01 ENCOUNTER — Encounter (HOSPITAL_COMMUNITY): Payer: Self-pay

## 2020-02-01 NOTE — Therapy (Signed)
[place], [place], [address] Phone: [phone]   Fax:  [phone]  Patient Details  Name: Brandon Warren MRN: [id number] Date of Birth: 27-Feb-1951 Referring Provider:  No ref. provider found  Encounter Date: 02/01/2020  OCCUPATIONAL THERAPY DISCHARGE SUMMARY  Visits from Start of Care: 1 Patient requesting to be discharged from OT services due to no temperature checks performed at the door. Patient only completed initial evaluation on 01/25/20.. No goals met.   Current functional level related to goals / functional outcomes: OT SHORT TERM GOAL #1    Title  Pt will be provided with and educated on HEP to improve LUE mobility required for ADL completion.    Time  4    Period  Weeks    Status  New    Target Date  02/24/20        OT SHORT TERM GOAL #2   Title  Pt will decrease pain in LUE to 2/10 or less to improve ability to sleep without waking due to pain.    Time  4    Period  Weeks    Status  New        OT SHORT TERM GOAL #3   Title  Pt will increase A/ROM to [facility] to improve ability to perform dressing and bathing tasks.    Time  4    Period  Weeks    Status  New        OT SHORT TERM GOAL #4   Title  Pt will decrease LUE fascial restrictions to minimal amounts or less to improve mobility required for overhead reaching.    Time  4    Period  Weeks    Status  New        OT SHORT TERM GOAL #5   Title  Pt will increase LUE strength to 4/5 or greater to improve strength and activity tolerance required for washing hair.    Time  4    Period  Weeks    Status  New       Remaining deficits: All deficits remain from evaluation: pain, fascial restrictions, decreased strength and ROM.    Education / Equipment: HEP provided at evaluation Plan: Patient agrees to discharge.  Patient goals were not met. Patient is being discharged due to the patient's request.  ?????            Ailene Ravel, OTR/L,CBIS  [phone]  02/01/2020, 8:33 AM  [place], [place], [address] Phone: [phone]   Fax:  [phone]

## 2020-02-03 ENCOUNTER — Ambulatory Visit (HOSPITAL_COMMUNITY): Payer: Medicare HMO

## 2020-02-08 ENCOUNTER — Ambulatory Visit (HOSPITAL_COMMUNITY): Payer: Medicare HMO

## 2020-02-09 ENCOUNTER — Ambulatory Visit (HOSPITAL_COMMUNITY): Payer: Medicare HMO

## 2020-02-15 ENCOUNTER — Encounter (HOSPITAL_COMMUNITY): Payer: Self-pay

## 2020-02-17 ENCOUNTER — Encounter (HOSPITAL_COMMUNITY): Payer: Self-pay

## 2020-02-21 DIAGNOSIS — Z6836 Body mass index (BMI) 36.0-36.9, adult: Secondary | ICD-10-CM | POA: Diagnosis not present

## 2020-02-21 DIAGNOSIS — Z0001 Encounter for general adult medical examination with abnormal findings: Secondary | ICD-10-CM | POA: Diagnosis not present

## 2020-02-21 DIAGNOSIS — E119 Type 2 diabetes mellitus without complications: Secondary | ICD-10-CM | POA: Diagnosis not present

## 2020-02-21 DIAGNOSIS — Z1389 Encounter for screening for other disorder: Secondary | ICD-10-CM | POA: Diagnosis not present

## 2020-02-21 DIAGNOSIS — E063 Autoimmune thyroiditis: Secondary | ICD-10-CM | POA: Diagnosis not present

## 2020-02-21 DIAGNOSIS — E7849 Other hyperlipidemia: Secondary | ICD-10-CM | POA: Diagnosis not present

## 2020-02-21 DIAGNOSIS — K602 Anal fissure, unspecified: Secondary | ICD-10-CM | POA: Diagnosis not present

## 2020-02-21 DIAGNOSIS — Z0000 Encounter for general adult medical examination without abnormal findings: Secondary | ICD-10-CM | POA: Diagnosis not present

## 2020-02-22 ENCOUNTER — Encounter (HOSPITAL_COMMUNITY): Payer: Self-pay

## 2020-02-24 DIAGNOSIS — I10 Essential (primary) hypertension: Secondary | ICD-10-CM | POA: Diagnosis not present

## 2020-02-24 DIAGNOSIS — E119 Type 2 diabetes mellitus without complications: Secondary | ICD-10-CM | POA: Diagnosis not present

## 2020-02-24 DIAGNOSIS — I2510 Atherosclerotic heart disease of native coronary artery without angina pectoris: Secondary | ICD-10-CM | POA: Diagnosis not present

## 2020-02-24 DIAGNOSIS — E063 Autoimmune thyroiditis: Secondary | ICD-10-CM | POA: Diagnosis not present

## 2020-03-26 DIAGNOSIS — E063 Autoimmune thyroiditis: Secondary | ICD-10-CM | POA: Diagnosis not present

## 2020-03-26 DIAGNOSIS — N400 Enlarged prostate without lower urinary tract symptoms: Secondary | ICD-10-CM | POA: Diagnosis not present

## 2020-03-26 DIAGNOSIS — I10 Essential (primary) hypertension: Secondary | ICD-10-CM | POA: Diagnosis not present

## 2020-03-26 DIAGNOSIS — E119 Type 2 diabetes mellitus without complications: Secondary | ICD-10-CM | POA: Diagnosis not present

## 2020-04-16 ENCOUNTER — Encounter (INDEPENDENT_AMBULATORY_CARE_PROVIDER_SITE_OTHER): Payer: Self-pay | Admitting: Bariatrics

## 2020-04-16 ENCOUNTER — Other Ambulatory Visit: Payer: Self-pay

## 2020-04-16 ENCOUNTER — Ambulatory Visit (INDEPENDENT_AMBULATORY_CARE_PROVIDER_SITE_OTHER): Payer: Medicare HMO | Admitting: Bariatrics

## 2020-04-16 VITALS — BP 169/74 | HR 76 | Temp 98.2°F | Ht 69.0 in | Wt 301.0 lb

## 2020-04-16 DIAGNOSIS — E559 Vitamin D deficiency, unspecified: Secondary | ICD-10-CM | POA: Diagnosis not present

## 2020-04-16 DIAGNOSIS — E038 Other specified hypothyroidism: Secondary | ICD-10-CM

## 2020-04-16 DIAGNOSIS — E1121 Type 2 diabetes mellitus with diabetic nephropathy: Secondary | ICD-10-CM

## 2020-04-16 DIAGNOSIS — R0602 Shortness of breath: Secondary | ICD-10-CM

## 2020-04-16 DIAGNOSIS — F3289 Other specified depressive episodes: Secondary | ICD-10-CM

## 2020-04-16 DIAGNOSIS — E1159 Type 2 diabetes mellitus with other circulatory complications: Secondary | ICD-10-CM | POA: Diagnosis not present

## 2020-04-16 DIAGNOSIS — G4739 Other sleep apnea: Secondary | ICD-10-CM | POA: Diagnosis not present

## 2020-04-16 DIAGNOSIS — E785 Hyperlipidemia, unspecified: Secondary | ICD-10-CM | POA: Diagnosis not present

## 2020-04-16 DIAGNOSIS — E7849 Other hyperlipidemia: Secondary | ICD-10-CM | POA: Diagnosis not present

## 2020-04-16 DIAGNOSIS — E1169 Type 2 diabetes mellitus with other specified complication: Secondary | ICD-10-CM

## 2020-04-16 DIAGNOSIS — I10 Essential (primary) hypertension: Secondary | ICD-10-CM

## 2020-04-16 DIAGNOSIS — E119 Type 2 diabetes mellitus without complications: Secondary | ICD-10-CM | POA: Diagnosis not present

## 2020-04-16 DIAGNOSIS — Z6841 Body Mass Index (BMI) 40.0 and over, adult: Secondary | ICD-10-CM

## 2020-04-16 DIAGNOSIS — R5383 Other fatigue: Secondary | ICD-10-CM

## 2020-04-16 DIAGNOSIS — I25119 Atherosclerotic heart disease of native coronary artery with unspecified angina pectoris: Secondary | ICD-10-CM

## 2020-04-16 DIAGNOSIS — Z0289 Encounter for other administrative examinations: Secondary | ICD-10-CM
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
## 2020-04-16 NOTE — Progress Notes (Signed)
Chief Complaint:   Brandon Warren (MR# [id number]) is a 69 y.o. male who presents for evaluation and treatment of obesity and related comorbidities. Current BMI is Body mass index is 44.45 kg/m.Brandon Warren has been struggling with his weight for many years and has been unsuccessful in either losing weight, maintaining weight loss, or reaching his healthy weight goal.  Brandon Warren is currently in the action stage of change and ready to dedicate time achieving and maintaining a healthier weight. Brandon Warren is interested in becoming our patient and working on intensive lifestyle modifications including (but not limited to) diet and exercise for weight loss.  Brandon Warren likes to Brandon Warren, but notes standing as an obstacle. He does eat late night snacks. 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    Brandon Warren's habits were reviewed today and are as follows: His family eats meals together, he thinks his family will eat healthier with him, his desired weight loss is 81 lbs, he has been heavy most of his life, he started gaining weight at age 40, his heaviest weight ever was 301 pounds, he craves pastry, he snacks frequently in the evenings, he has problems with excessive hunger, he frequently eats larger portions than normal and he struggles with emotional eating.  Depression Screen Brandon Warren (modified PHQ-9) score was 20.  Depression screen Brandon Warren 2/9 04/16/2020  Decreased Interest 3  Down, Depressed, Hopeless 3  PHQ - 2 Score 6  Altered sleeping 1  Tired, decreased energy 3  Change in appetite 3  Feeling bad or failure about yourself  1  Trouble concentrating 2  Moving slowly or fidgety/restless 3  Suicidal thoughts 1  PHQ-9 Score 20  Difficult doing work/chores Extremely dIfficult   Subjective:   Other fatigue. Brandon Warren admits to daytime somnolence and denies waking up still tired. Brandon Warren generally gets 8 hours of sleep per night, and states that he has generally restful sleep.  Snoring is not present with CPAP. Apneic episodes are not present with CPAP. Epworth Sleepiness Score is 9.  SOB (shortness of breath) on exertion. Earley notes increasing shortness of breath with certain activities and seems to be worsening over time with weight gain. He notes getting out of breath sooner with activity than he used to. This has gotten worse recently. Brandon Warren denies shortness of breath at rest or orthopnea.  Type 2 diabetes mellitus with diabetic nephropathy, without long-term current use of insulin (Brandon Warren). Brandon Warren is taking glimepiride and Trulicity.  Lab Results  Component Value Date   HGBA1C 6.6 (H) 03/23/2018   HGBA1C 5.9 (H) 09/14/2017   HGBA1C 7.0 (H) 05/12/2017   Lab Results  Component Value Date   LDLCALC 94 03/23/2018   CREATININE 1.28 (H) 03/23/2018   No results found for: INSULIN  Hypertension associated with diabetes (Brandon Warren). Brandon Warren is taking HCTZ, Losartan, Coreg, and amlodipine. He reports having pain in his shoulder and left foot, which is elevated today.  BP Readings from Last 3 Encounters:  04/16/20 (!) 169/74  06/15/18 (!) 144/66  04/23/18 139/86   Lab Results  Component Value Date   CREATININE 1.28 (H) 03/23/2018   CREATININE 1.26 (H) 03/15/2018   CREATININE 1.31 (H) 09/14/2017   Other sleep apnea. Brandon Warren uses CPAP and reports restful sleep.  Coronary artery disease with angina pectoris, unspecified vessel or lesion type, unspecified whether native or transplanted heart (Brandon Warren). Brandon Warren is taking Lasix. 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  Other specified hypothyroidism. Brandon Warren is taking levothyroxine.   Lab Results  Component Value Date   TSH 2.97 03/23/2018   Hyperlipidemia associated with type 2 diabetes mellitus (Brandon Warren). Brandon Warren is taking Zetia and Fish Oil.   Lab Results  Component Value Date   CHOL 199 03/23/2018   HDL 54 03/23/2018   LDLCALC 94 03/23/2018   TRIG 384 (H) 03/23/2018   CHOLHDL  3.7 03/23/2018   Lab Results  Component Value Date   ALT 36 03/23/2018   AST 30 03/23/2018   BILITOT 0.6 03/23/2018   The 10-year ASCVD risk score Brandon Bussing DC Jr., Brandon al., 2013) is: 51.1%   Values used to calculate the score:     Age: 1 years     Sex: Male     Is Non-Hispanic African American: No     Diabetic: Yes     Tobacco smoker: No     Systolic Blood Pressure: 099 mmHg     Is BP treated: Yes     HDL Cholesterol: 54 mg/dL     Total Cholesterol: 199 mg/dL  Vitamin D deficiency. Brandon Warren is taking Vitamin D supplementation. Last Vitamin D was 39 on 03/23/2018.  Other depression, with emotional eating. Brandon Warren is struggling with emotional eating and using food for comfort to the extent that it is negatively impacting his health. He has been working on behavior modification techniques to help reduce his emotional eating and has been somewhat successful. He shows no sign of suicidal or homicidal ideations.   Assessment/Plan:   Other fatigue. Brandon Warren does feel that his weight is causing his energy to be lower than it should be. Fatigue may be related to obesity, depression or many other causes. Labs will be ordered, and in the meanwhile, Brandon Warren will focus on self care including making healthy food choices, increasing physical activity and focusing on stress reduction. EKG 12-Lead, VITAMIN D 25 Hydroxy (Vit-D Deficiency, Fractures) testing ordered today.  SOB (shortness of breath) on exertion. Brandon Warren does feel that he gets out of breath more easily that he used to when he exercises. Brandon Warren shortness of breath appears to be obesity related and exercise induced. He has agreed to work on weight loss and gradually increase exercise to treat his exercise induced shortness of breath. Will continue to monitor closely.  Type 2 diabetes mellitus with diabetic nephropathy, without long-term current use of insulin (Brandon Warren). Good blood sugar control is important to decrease the likelihood of diabetic  complications such as nephropathy, neuropathy, limb loss, blindness, coronary artery disease, and death. Intensive lifestyle modification including diet, exercise and weight loss are the first line of treatment for diabetes. Severo will stop glimepiride and continue Trulicity as directed. Hemoglobin A1c, Insulin, random, C-peptide labs ordered today.  Hypertension associated with diabetes ([place]). Colbi is working on healthy weight loss and exercise to improve blood pressure control. We will watch for signs of hypotension as he continues his lifestyle modifications. He will continue his current medication as directed. Comprehensive metabolic panel ordered today.  Other sleep apnea. Lavert will use CPAP nightly.  Coronary artery disease with angina pectoris, unspecified vessel or lesion type, unspecified whether native or transplanted heart ([place]). Tysin will follow-up with Cardiology yearly.  Other specified hypothyroidism. Patient with long-standing hypothyroidism, on levothyroxine therapy. He appears euthyroid. Orders and follow up as documented in patient record. T3, T4, free, TSH levels ordered today.  Counseling . Good thyroid control is important for overall health. Supratherapeutic thyroid levels are dangerous and will not  improve weight loss results. . The correct way to take levothyroxine is fasting, with water, separated by at least 30 minutes from breakfast, and separated by more than 4 hours from calcium, iron, multivitamins, acid reflux medications (PPIs).    Hyperlipidemia associated with type 2 diabetes mellitus ([place]). Cardiovascular risk and specific lipid/LDL goals reviewed.  We discussed several lifestyle modifications today and Lashawn will continue to work on diet, exercise and weight loss efforts. Orders and follow up as documented in patient record. Reginold will continue his medications as directed. Lipid Panel With LDL/HDL Ratio ordered today.  Counseling Intensive  lifestyle modifications are the first line treatment for this issue. . Dietary changes: Increase soluble fiber. Decrease simple carbohydrates. . Exercise changes: Moderate to vigorous-intensity aerobic activity 150 minutes per week if tolerated. . Lipid-lowering medications: see documented in medical record.   Vitamin D deficiency. Low Vitamin D level contributes to fatigue and are associated with obesity, breast, and colon cancer. He agrees to continue to take Vitamin D and will have routine testing of Vitamin D today.   Other depression, with emothional eating. Behavior modification techniques were discussed today to help Marco deal with his emotional/non-hunger eating behaviors.  Orders and follow up as documented in patient record. Jarom will be referred to Dr. Mallie Mussel, our bariatric psychologist, for evaluation.  Class 3 severe obesity with serious comorbidity and body mass index (BMI) of 40.0 to 44.9 in adult, unspecified obesity type ([place]).  Chidi is currently in the action stage of change and his goal is to continue with weight loss efforts. I recommend Cordarius begin the structured treatment plan as follows:  He has agreed to the Category 4 Plan.  He will work on meal planning and intentional eating.   We independently reviewed with the patient lab results.    Exercise goals: Older adults should follow the adult guidelines. When older adults cannot meet the adult guidelines, they should be as physically active as their abilities and conditions will allow.    Behavioral modification strategies: increasing lean protein intake, decreasing simple carbohydrates, increasing vegetables, increasing water intake, decreasing eating out, no skipping meals, meal planning and cooking strategies, keeping healthy foods in the home and planning for success.  He was informed of the importance of frequent follow-up visits to maximize his success with intensive lifestyle modifications for his  multiple health conditions. He was informed we would discuss his lab results at his next visit unless there is a critical issue that needs to be addressed sooner. Imani agreed to keep his next visit at the agreed upon time to discuss these results.  Objective:   Blood pressure (!) 169/74, pulse 76, temperature 98.2 F (36.8 C), height 5\' 9"  (1.753 m), weight (!) 301 lb (136.5 kg), SpO2 96 %. Body mass index is 44.45 kg/m.  Indirect Calorimeter completed today shows a VO2 of 411 and a REE of 2858.   General: Cooperative, alert, well developed, in no acute distress. HEENT: Conjunctivae and lids unremarkable. Cardiovascular: Regular rhythm.  Lungs: Normal work of breathing. Neurologic: No focal deficits.  Extremities: Pt has a soft cast/shoe on the left foot and lower leg.  Lab Results  Component Value Date   CREATININE 1.28 (H) 03/23/2018   BUN 25 03/23/2018   NA 134 (L) 03/23/2018   K 4.5 03/23/2018   CL 99 03/23/2018   CO2 22 03/23/2018   Lab Results  Component Value Date   ALT 36 03/23/2018   AST 30 03/23/2018   BILITOT 0.6  03/23/2018   Lab Results  Component Value Date   HGBA1C 6.6 (H) 03/23/2018   HGBA1C 5.9 (H) 09/14/2017   HGBA1C 7.0 (H) 05/12/2017   No results found for: INSULIN Lab Results  Component Value Date   TSH 2.97 03/23/2018   Lab Results  Component Value Date   CHOL 199 03/23/2018   HDL 54 03/23/2018   LDLCALC 94 03/23/2018   TRIG 384 (H) 03/23/2018   CHOLHDL 3.7 03/23/2018   Lab Results  Component Value Date   WBC 8.9 03/15/2018   HGB 14.7 03/15/2018   HCT 44.7 03/15/2018   MCV 85.1 03/15/2018   PLT 250 03/15/2018   No results found for: IRON, TIBC, FERRITIN  Obesity Behavioral Intervention Visit Documentation for Insurance:   Approximately 15 minutes were spent on the discussion below.  ASK: We discussed the diagnosis of obesity with Brandon Warren today and Icholas agreed to give [place] permission to discuss obesity behavioral modification  therapy today.  ASSESS: Yonatan has the diagnosis of obesity and his BMI today is 44.45. Tab is in the action stage of change.   ADVISE: Favio was educated on the multiple health risks of obesity as well as the benefit of weight loss to improve his health. He was advised of the need for long term treatment and the importance of lifestyle modifications to improve his current health and to decrease his risk of future health problems.  AGREE: Multiple dietary modification options and treatment options were discussed and Ramy agreed to follow the recommendations documented in the above note.  ARRANGE: Aran was educated on the importance of frequent visits to treat obesity as outlined per CMS and USPSTF guidelines and agreed to schedule his next follow up appointment today.  Attestation Statements:   Reviewed by clinician on day of visit: allergies, medications, problem list, medical history, surgical history, family history, social history, and previous encounter notes.  Migdalia Dk, am acting as Location manager for [employer], DO   I have reviewed the above documentation for accuracy and completeness, and I agree with the above. Jearld Lesch, DO

## 2020-04-17 ENCOUNTER — Encounter (INDEPENDENT_AMBULATORY_CARE_PROVIDER_SITE_OTHER): Payer: Self-pay | Admitting: Bariatrics

## 2020-04-17 DIAGNOSIS — I129 Hypertensive chronic kidney disease with stage 1 through stage 4 chronic kidney disease, or unspecified chronic kidney disease: Secondary | ICD-10-CM | POA: Insufficient documentation

## 2020-04-17 LAB — TSH: TSH: 1.84 u[IU]/mL (ref 0.450–4.500)

## 2020-04-17 LAB — COMPREHENSIVE METABOLIC PANEL
ALT: 38 IU/L (ref 0–44)
AST: 28 IU/L (ref 0–40)
Albumin/Globulin Ratio: 1.5 (ref 1.2–2.2)
Albumin: 4.6 g/dL (ref 3.8–4.8)
Alkaline Phosphatase: 57 IU/L (ref 48–121)
BUN/Creatinine Ratio: 21 (ref 10–24)
BUN: 38 mg/dL — ABNORMAL HIGH (ref 8–27)
Bilirubin Total: 0.5 mg/dL (ref 0.0–1.2)
CO2: 22 mmol/L (ref 20–29)
Calcium: 10.4 mg/dL — ABNORMAL HIGH (ref 8.6–10.2)
Chloride: 95 mmol/L — ABNORMAL LOW (ref 96–106)
Creatinine, Ser: 1.84 mg/dL — ABNORMAL HIGH (ref 0.76–1.27)
GFR calc Af Amer: 42 mL/min/{1.73_m2} — ABNORMAL LOW (ref >59)
GFR calc non Af Amer: 36 mL/min/{1.73_m2} — ABNORMAL LOW (ref >59)
Globulin, Total: 3.1 g/dL (ref 1.5–4.5)
Glucose: 158 mg/dL — ABNORMAL HIGH (ref 65–99)
Potassium: 4.9 mmol/L (ref 3.5–5.2)
Sodium: 138 mmol/L (ref 134–144)
Total Protein: 7.7 g/dL (ref 6.0–8.5)

## 2020-04-17 LAB — HEMOGLOBIN A1C
Est. average glucose Bld gHb Est-mCnc: 163 mg/dL
Hgb A1c MFr Bld: 7.3 % — ABNORMAL HIGH (ref 4.8–5.6)

## 2020-04-17 LAB — LIPID PANEL WITH LDL/HDL RATIO
Cholesterol, Total: 219 mg/dL — ABNORMAL HIGH (ref 100–199)
HDL: 51 mg/dL (ref >39)
LDL Chol Calc (NIH): 95 mg/dL (ref 0–99)
LDL/HDL Ratio: 1.9 ratio (ref 0.0–3.6)
Triglycerides: 440 mg/dL — ABNORMAL HIGH (ref 0–149)
VLDL Cholesterol Cal: 73 mg/dL — ABNORMAL HIGH (ref 5–40)

## 2020-04-17 LAB — INSULIN, RANDOM: INSULIN: 79.4 u[IU]/mL — ABNORMAL HIGH (ref 2.6–24.9)

## 2020-04-17 LAB — T4, FREE: Free T4: 1.16 ng/dL (ref 0.82–1.77)

## 2020-04-17 LAB — VITAMIN D 25 HYDROXY (VIT D DEFICIENCY, FRACTURES): Vit D, 25-Hydroxy: 37.5 ng/mL (ref 30.0–100.0)

## 2020-04-17 LAB — T3: T3, Total: 107 ng/dL (ref 71–180)

## 2020-04-17 LAB — C-PEPTIDE: C-Peptide: 15.7 ng/mL — ABNORMAL HIGH (ref 1.1–4.4)

## 2020-04-25 ENCOUNTER — Encounter (INDEPENDENT_AMBULATORY_CARE_PROVIDER_SITE_OTHER): Payer: Self-pay

## 2020-04-25 ENCOUNTER — Telehealth (INDEPENDENT_AMBULATORY_CARE_PROVIDER_SITE_OTHER): Payer: Self-pay | Admitting: Psychology

## 2020-04-25 ENCOUNTER — Telehealth (INDEPENDENT_AMBULATORY_CARE_PROVIDER_SITE_OTHER): Payer: Medicare HMO | Admitting: Psychology

## 2020-04-25 ENCOUNTER — Other Ambulatory Visit: Payer: Self-pay

## 2020-04-25 NOTE — Telephone Encounter (Signed)
  Office: [phone]  /  Fax: [phone]  Date of Call: April 25, 2020  Time of Call: 11:02am Provider: Glennie Isle, PsyD  CONTENT: This provider called Legrand Como to check-in as he did not present for today's MyChart Video Visit appointment at 11:00am. A HIPAA compliant voicemail was left requesting a call back. Of note, this provider stayed on the MyChart Video Visit appointment for 5 minutes prior to signing off per the clinic's grace period policy.    PLAN: This provider will wait for Alexandre to call back. No further follow-up planned by this provider.

## 2020-04-26 ENCOUNTER — Encounter (INDEPENDENT_AMBULATORY_CARE_PROVIDER_SITE_OTHER): Payer: Self-pay

## 2020-05-01 ENCOUNTER — Other Ambulatory Visit: Payer: Self-pay

## 2020-05-01 ENCOUNTER — Encounter (INDEPENDENT_AMBULATORY_CARE_PROVIDER_SITE_OTHER): Payer: Self-pay | Admitting: Bariatrics

## 2020-05-01 ENCOUNTER — Ambulatory Visit (INDEPENDENT_AMBULATORY_CARE_PROVIDER_SITE_OTHER): Payer: Medicare HMO | Admitting: Bariatrics

## 2020-05-01 VITALS — BP 160/75 | HR 74 | Temp 98.0°F | Ht 69.0 in | Wt 291.0 lb

## 2020-05-01 DIAGNOSIS — E781 Pure hyperglyceridemia: Secondary | ICD-10-CM | POA: Diagnosis not present

## 2020-05-01 DIAGNOSIS — N1832 Chronic kidney disease, stage 3b: Secondary | ICD-10-CM

## 2020-05-01 DIAGNOSIS — E559 Vitamin D deficiency, unspecified: Secondary | ICD-10-CM

## 2020-05-01 DIAGNOSIS — E1169 Type 2 diabetes mellitus with other specified complication: Secondary | ICD-10-CM

## 2020-05-01 DIAGNOSIS — E669 Obesity, unspecified: Secondary | ICD-10-CM | POA: Diagnosis not present

## 2020-05-01 DIAGNOSIS — Z6841 Body Mass Index (BMI) 40.0 and over, adult: Secondary | ICD-10-CM

## 2020-05-01 MED ORDER — METFORMIN HCL 500 MG PO TABS: 500.0000 mg | ORAL_TABLET | Freq: Every day | ORAL | 0 refills | Status: DC

## 2020-05-01 MED ORDER — VITAMIN D (ERGOCALCIFEROL) 1.25 MG (50000 UNIT) PO CAPS: 50000.0000 [IU] | ORAL_CAPSULE | ORAL | 0 refills | Status: DC

## 2020-05-01 NOTE — Progress Notes (Signed)
Chief Complaint:   OBESITY Brandon Warren is here to discuss his progress with his obesity treatment plan along with follow-up of his obesity related diagnoses. Brandon Warren is on the Category 4 Plan and [facility] and states he is following his eating plan approximately 0% of the time. Brandon Warren states he is exercising on the treadmill 10 minutes 3 times per week.  Today's visit was #: 2 Starting weight: 301 lbs Starting date: 04/16/2020 Today's weight: 291 lbs Today's date: 05/01/2020 Total lbs lost to date: 10 Total lbs lost since last in-office visit: 10  Interim History: Brandon Warren is down 10 lbs, but states that he is mainly doing portion control. He cut out butter and mayonnaise.  Subjective:   Diabetes mellitus type 2 in obese ([place]). Brandon Warren is taking Trulicity. Fasting blood sugars are in the 130's, which is down from the 170's. He had been on metformin in the past.  Lab Results  Component Value Date   HGBA1C 7.3 (H) 04/16/2020   HGBA1C 6.6 (H) 03/23/2018   HGBA1C 5.9 (H) 09/14/2017   Lab Results  Component Value Date   LDLCALC 95 04/16/2020   CREATININE 1.84 (H) 04/16/2020   Lab Results  Component Value Date   INSULIN 79.4 (H) 04/16/2020   Vitamin D deficiency. Brandon Warren is on OTC Vitamin D supplementation.    Ref. Range 04/16/2020 15:16  Vitamin D, 25-Hydroxy Latest Ref Range: 30.0 - 100.0 ng/mL 37.5   Hypocalcemia. Brandon Warren is taking HCTZ. Calcium was elevated at 10.4 on 04/16/2020.  Stage 3b chronic kidney disease. Brandon Warren does not see a nephrologist.  High triglycerides. Last triglyceride level was elevated at 440 on 04/16/2020.  Assessment/Plan:   Diabetes mellitus type 2 in obese ([place]). Good blood sugar control is important to decrease the likelihood of diabetic complications such as nephropathy, neuropathy, limb loss, blindness, coronary artery disease, and death. Intensive lifestyle modification including diet, exercise and weight loss are the first line of treatment  for diabetes. Brandon Warren will continue his current medication as directed. Prescription was given for metFORMIN (GLUCOPHAGE) 500 MG tablet 1 in the evening with a meal #30 with 0 refills. Sheet was provided on metformin.  Vitamin D deficiency. Low Vitamin D level contributes to fatigue and are associated with obesity, breast, and colon cancer. He was given a prescription for Vitamin D, Ergocalciferol, (DRISDOL) 1.25 MG (50000 UNIT) CAPS capsule every week #4 with 0 refills and will follow-up for routine testing of Vitamin D, at least 2-3 times per year to avoid over-replacement.   Hypocalcemia. We will watch his calcium level for now - no action at this time.  Stage 3b chronic kidney disease. Will follow.  High triglycerides. Brandon Warren will decrease carbohydrates, decrease blood sugars, and will decrease ETOH intake.  Class 3 severe obesity with serious comorbidity and body mass index (BMI) of 40.0 to 44.9 in adult, unspecified obesity type (Brandon Warren).  Brandon Warren is currently in the action stage of change. As such, his goal is to continue with weight loss efforts. He has agreed to practicing portion control and making smarter food choices, such as increasing vegetables and decreasing simple carbohydrates.   He will work on meal planning and intentional eating.   We independently reviewed with the patient labs from 04/16/2020 including CMP, lipids, Vitamin D, A1c, insulin, and thyroid panel.  Exercise goals: Brandon Warren will slowly continue the treadmill 5 minutes every other day.  Behavioral modification strategies: increasing lean protein intake, decreasing simple carbohydrates, increasing vegetables, increasing water intake, decreasing  eating out, no skipping meals, meal planning and cooking strategies, keeping healthy foods in the home and planning for success.  Brandon Warren has agreed to follow-up with our clinic in 2 weeks. He was informed of the importance of frequent follow-up visits to maximize his success  with intensive lifestyle modifications for his multiple health conditions.   Objective:   Blood pressure (!) 160/75, pulse 74, temperature 98 F (36.7 C), height 5\' 9"  (1.753 m), weight 291 lb (132 kg), SpO2 95 %. Body mass index is 42.97 kg/m.  General: Cooperative, alert, well developed, in no acute distress. HEENT: Conjunctivae and lids unremarkable. Cardiovascular: Regular rhythm.  Lungs: Normal work of breathing. Neurologic: No focal deficits.   Lab Results  Component Value Date   CREATININE 1.84 (H) 04/16/2020   BUN 38 (H) 04/16/2020   NA 138 04/16/2020   K 4.9 04/16/2020   CL 95 (L) 04/16/2020   CO2 22 04/16/2020   Lab Results  Component Value Date   ALT 38 04/16/2020   AST 28 04/16/2020   ALKPHOS 57 04/16/2020   BILITOT 0.5 04/16/2020   Lab Results  Component Value Date   HGBA1C 7.3 (H) 04/16/2020   HGBA1C 6.6 (H) 03/23/2018   HGBA1C 5.9 (H) 09/14/2017   HGBA1C 7.0 (H) 05/12/2017   Lab Results  Component Value Date   INSULIN 79.4 (H) 04/16/2020   Lab Results  Component Value Date   TSH 1.840 04/16/2020   Lab Results  Component Value Date   CHOL 219 (H) 04/16/2020   HDL 51 04/16/2020   LDLCALC 95 04/16/2020   TRIG 440 (H) 04/16/2020   CHOLHDL 3.7 03/23/2018   Lab Results  Component Value Date   WBC 8.9 03/15/2018   HGB 14.7 03/15/2018   HCT 44.7 03/15/2018   MCV 85.1 03/15/2018   PLT 250 03/15/2018   No results found for: IRON, TIBC, FERRITIN  Obesity Behavioral Intervention Documentation for Insurance:   Approximately 15 minutes were spent on the discussion below.  ASK: We discussed the diagnosis of obesity with Brandon Warren today and Brandon Warren agreed to give [place] permission to discuss obesity behavioral modification therapy today.  ASSESS: Brandon Warren has the diagnosis of obesity and his BMI today is 43.0. Brandon Warren is in the action stage of change.   ADVISE: Brandon Warren was educated on the multiple health risks of obesity as well as the benefit of  weight loss to improve his health. He was advised of the need for long term treatment and the importance of lifestyle modifications to improve his current health and to decrease his risk of future health problems.  AGREE: Multiple dietary modification options and treatment options were discussed and Brandon Warren agreed to follow the recommendations documented in the above note.  ARRANGE: Brandon Warren was educated on the importance of frequent visits to treat obesity as outlined per CMS and USPSTF guidelines and agreed to schedule his next follow up appointment today.  Attestation Statements:   Reviewed by clinician on day of visit: allergies, medications, problem list, medical history, surgical history, family history, social history, and previous encounter notes.  Migdalia Dk, am acting as Location manager for [employer], DO   I have reviewed the above documentation for accuracy and completeness, and I agree with the above. Jearld Lesch, DO

## 2020-05-15 ENCOUNTER — Other Ambulatory Visit (INDEPENDENT_AMBULATORY_CARE_PROVIDER_SITE_OTHER): Payer: Self-pay | Admitting: Bariatrics

## 2020-05-15 ENCOUNTER — Ambulatory Visit (INDEPENDENT_AMBULATORY_CARE_PROVIDER_SITE_OTHER): Payer: Medicare HMO | Admitting: Bariatrics

## 2020-05-15 DIAGNOSIS — E559 Vitamin D deficiency, unspecified: Secondary | ICD-10-CM

## 2020-05-15 MED ORDER — VITAMIN D (ERGOCALCIFEROL) 1.25 MG (50000 UNIT) PO CAPS: 50000.0000 [IU] | ORAL_CAPSULE | ORAL | 0 refills | Status: DC

## 2020-05-25 ENCOUNTER — Other Ambulatory Visit (INDEPENDENT_AMBULATORY_CARE_PROVIDER_SITE_OTHER): Payer: Self-pay | Admitting: Bariatrics

## 2020-05-25 DIAGNOSIS — E063 Autoimmune thyroiditis: Secondary | ICD-10-CM | POA: Diagnosis not present

## 2020-05-25 DIAGNOSIS — N400 Enlarged prostate without lower urinary tract symptoms: Secondary | ICD-10-CM | POA: Diagnosis not present

## 2020-05-25 DIAGNOSIS — I10 Essential (primary) hypertension: Secondary | ICD-10-CM | POA: Diagnosis not present

## 2020-05-25 DIAGNOSIS — E669 Obesity, unspecified: Secondary | ICD-10-CM

## 2020-05-25 DIAGNOSIS — E119 Type 2 diabetes mellitus without complications: Secondary | ICD-10-CM | POA: Diagnosis not present

## 2020-05-29 DIAGNOSIS — M25461 Effusion, right knee: Secondary | ICD-10-CM | POA: Diagnosis not present

## 2020-05-29 DIAGNOSIS — M2391 Unspecified internal derangement of right knee: Secondary | ICD-10-CM | POA: Diagnosis not present

## 2020-05-29 DIAGNOSIS — Z6837 Body mass index (BMI) 37.0-37.9, adult: Secondary | ICD-10-CM | POA: Diagnosis not present

## 2020-05-31 ENCOUNTER — Other Ambulatory Visit: Payer: Self-pay

## 2020-05-31 ENCOUNTER — Encounter (INDEPENDENT_AMBULATORY_CARE_PROVIDER_SITE_OTHER): Payer: Self-pay | Admitting: Bariatrics

## 2020-05-31 ENCOUNTER — Ambulatory Visit (INDEPENDENT_AMBULATORY_CARE_PROVIDER_SITE_OTHER): Payer: Medicare HMO | Admitting: Bariatrics

## 2020-05-31 VITALS — BP 149/82 | HR 73 | Temp 98.2°F | Ht 69.0 in | Wt 297.0 lb

## 2020-05-31 DIAGNOSIS — Z6841 Body Mass Index (BMI) 40.0 and over, adult: Secondary | ICD-10-CM

## 2020-05-31 DIAGNOSIS — E669 Obesity, unspecified: Secondary | ICD-10-CM | POA: Diagnosis not present

## 2020-05-31 DIAGNOSIS — E1169 Type 2 diabetes mellitus with other specified complication: Secondary | ICD-10-CM | POA: Diagnosis not present

## 2020-05-31 DIAGNOSIS — E781 Pure hyperglyceridemia: Secondary | ICD-10-CM | POA: Diagnosis not present

## 2020-05-31 MED ORDER — METFORMIN HCL 500 MG PO TABS: 500.0000 mg | ORAL_TABLET | Freq: Two times a day (BID) | ORAL | 0 refills | Status: DC

## 2020-06-04 ENCOUNTER — Encounter (INDEPENDENT_AMBULATORY_CARE_PROVIDER_SITE_OTHER): Payer: Self-pay | Admitting: Bariatrics

## 2020-06-04 NOTE — Progress Notes (Signed)
Chief Complaint:   OBESITY Brandon Warren is here to discuss his progress with his obesity treatment plan along with follow-up of his obesity related diagnoses. Brandon Warren is practicing portion control and making smarter food choices, such as increasing vegetables and decreasing simple carbohydrates and states Brandon Warren is following his eating plan approximately 60% of the time. Brandon Warren states Brandon Warren is exercising 0 minutes 0 times per week.  Today's visit was #: 3 Starting weight: 301 lbs Starting date: 04/16/2020 Today's weight: 297 lbs Today's date: 05/31/2020 Total lbs lost to date: 4 Total lbs lost since last in-office visit: 0  Interim History: Brandon Warren is up 6 lbs. Brandon Warren states Brandon Warren has been eating out more.  Subjective:   Diabetes mellitus type 2 in obese (Brandon Warren). Fasting blood sugars are in the 130's with 2-hour postprandials in the 160's.  Lab Results  Component Value Date   HGBA1C 7.3 (H) 04/16/2020   HGBA1C 6.6 (H) 03/23/2018   HGBA1C 5.9 (H) 09/14/2017   Lab Results  Component Value Date   LDLCALC 95 04/16/2020   CREATININE 1.84 (H) 04/16/2020   Lab Results  Component Value Date   INSULIN 79.4 (H) 04/16/2020   Hypertriglyceridemia. Brandon Warren is taking Brandon Warren.   Ref. Range 04/16/2020 15:16  Triglycerides Latest Ref Range: 0 - 149 mg/dL 440 (H)   Assessment/Plan:   Diabetes mellitus type 2 in obese (Brandon Warren). Good blood sugar control is important to decrease the likelihood of diabetic complications such as nephropathy, neuropathy, limb loss, blindness, coronary artery disease, and death. Intensive lifestyle modification including diet, exercise and weight loss are the first line of treatment for diabetes. Prescription was given for metFORMIN (GLUCOPHAGE) 500 MG tablet 1 PO daily with breakfast and lunch #60 with 0 refills.  Hypertriglyceridemia. Brandon Warren will continue Brandon Warren as directed and will decrease his intake of carbohydrates.  Class 3 severe obesity with serious comorbidity and  body mass index (BMI) of 40.0 to 44.9 in adult, unspecified obesity type (Brandon Warren).  Brandon Warren is currently in the action stage of change. As such, his goal is to continue with weight loss efforts. Brandon Warren has agreed to practicing portion control and making smarter food choices, such as increasing vegetables and decreasing simple carbohydrates.   Brandon Warren will work on meal planning, intentional eating, and will decrease eating out.  Exercise goals: Older adults should follow the adult guidelines. When older adults cannot meet the adult guidelines, they should be as physically active as their abilities and conditions will allow.   Behavioral modification strategies: increasing lean protein intake, decreasing simple carbohydrates, increasing vegetables, increasing water intake, decreasing eating out, no skipping meals, meal planning and cooking strategies, keeping healthy foods in the home and avoiding temptations.  Brandon Warren has agreed to follow-up with our clinic in 2-3 weeks. Brandon Warren was informed of the importance of frequent follow-up visits to maximize his success with intensive lifestyle modifications for his multiple health conditions.   Objective:   Blood pressure (!) 149/82, pulse 73, temperature 98.2 F (36.8 C), height 5\' 9"  (1.753 m), weight 297 lb (134.7 kg), SpO2 94 %. Body mass index is 43.86 kg/m.  General: Cooperative, alert, well developed, in no acute distress. HEENT: Conjunctivae and lids unremarkable. Cardiovascular: Regular rhythm.  Lungs: Normal work of breathing. Neurologic: No focal deficits.   Lab Results  Component Value Date   CREATININE 1.84 (H) 04/16/2020   BUN 38 (H) 04/16/2020   NA 138 04/16/2020   K 4.9 04/16/2020   CL 95 (L) 04/16/2020  CO2 22 04/16/2020   Lab Results  Component Value Date   ALT 38 04/16/2020   AST 28 04/16/2020   ALKPHOS 57 04/16/2020   BILITOT 0.5 04/16/2020   Lab Results  Component Value Date   HGBA1C 7.3 (H) 04/16/2020   HGBA1C 6.6 (H)  03/23/2018   HGBA1C 5.9 (H) 09/14/2017   HGBA1C 7.0 (H) 05/12/2017   Lab Results  Component Value Date   INSULIN 79.4 (H) 04/16/2020   Lab Results  Component Value Date   TSH 1.840 04/16/2020   Lab Results  Component Value Date   CHOL 219 (H) 04/16/2020   HDL 51 04/16/2020   LDLCALC 95 04/16/2020   TRIG 440 (H) 04/16/2020   CHOLHDL 3.7 03/23/2018   Lab Results  Component Value Date   WBC 8.9 03/15/2018   HGB 14.7 03/15/2018   HCT 44.7 03/15/2018   MCV 85.1 03/15/2018   PLT 250 03/15/2018   No results found for: IRON, TIBC, FERRITIN  Obesity Behavioral Intervention Documentation for Insurance:   Approximately 15 minutes were spent on the discussion below.  ASK: We discussed the diagnosis of obesity with Brandon Warren today and Brandon Warren agreed to give [place] permission to discuss obesity behavioral modification therapy today.  ASSESS: Brandon Warren has the diagnosis of obesity and his BMI today is 43.9. Brandon Warren is in the action stage of change.   ADVISE: Brandon Warren was educated on the multiple health risks of obesity as well as the benefit of weight loss to improve his health. Brandon Warren was advised of the need for long term treatment and the importance of lifestyle modifications to improve his current health and to decrease his risk of future health problems.  AGREE: Multiple dietary modification options and treatment options were discussed and Brandon Warren agreed to follow the recommendations documented in the above note.  ARRANGE: Brandon Warren was educated on the importance of frequent visits to treat obesity as outlined per CMS and USPSTF guidelines and agreed to schedule his next follow up appointment today.  Attestation Statements:   Reviewed by clinician on day of visit: allergies, medications, problem list, medical history, surgical history, family history, social history, and previous encounter notes.  Migdalia Dk, am acting as Location manager for [employer], DO   I have reviewed the  above documentation for accuracy and completeness, and I agree with the above. Jearld Lesch, DO

## 2020-06-14 DIAGNOSIS — M25461 Effusion, right knee: Secondary | ICD-10-CM | POA: Diagnosis not present

## 2020-06-21 ENCOUNTER — Ambulatory Visit (INDEPENDENT_AMBULATORY_CARE_PROVIDER_SITE_OTHER): Payer: Medicare HMO | Admitting: Bariatrics

## 2020-07-19 DIAGNOSIS — Z23 Encounter for immunization: Secondary | ICD-10-CM | POA: Diagnosis not present

## 2020-07-24 ENCOUNTER — Encounter (INDEPENDENT_AMBULATORY_CARE_PROVIDER_SITE_OTHER): Payer: Medicare HMO

## 2020-07-24 ENCOUNTER — Other Ambulatory Visit: Payer: Self-pay

## 2020-07-24 ENCOUNTER — Ambulatory Visit: Payer: Medicare HMO | Admitting: Cardiology

## 2020-07-24 ENCOUNTER — Encounter: Payer: Self-pay | Admitting: Cardiology

## 2020-07-24 VITALS — BP 134/88 | HR 88 | Ht 70.0 in | Wt 297.0 lb

## 2020-07-24 DIAGNOSIS — I2510 Atherosclerotic heart disease of native coronary artery without angina pectoris: Secondary | ICD-10-CM | POA: Diagnosis not present

## 2020-07-24 DIAGNOSIS — G4733 Obstructive sleep apnea (adult) (pediatric): Secondary | ICD-10-CM | POA: Diagnosis not present

## 2020-07-24 DIAGNOSIS — R002 Palpitations: Secondary | ICD-10-CM | POA: Diagnosis not present

## 2020-07-24 DIAGNOSIS — E782 Mixed hyperlipidemia: Secondary | ICD-10-CM

## 2020-07-24 DIAGNOSIS — I10 Essential (primary) hypertension: Secondary | ICD-10-CM

## 2020-07-24 MED ORDER — CARVEDILOL 25 MG PO TABS
25.0000 mg | ORAL_TABLET | Freq: Two times a day (BID) | ORAL | 3 refills | Status: DC
Start: 2020-07-24 — End: 2020-09-28

## 2020-07-24 MED ORDER — HYDROCHLOROTHIAZIDE 50 MG PO TABS: 50.0000 mg | ORAL_TABLET | Freq: Every day | ORAL | 3 refills | Status: DC

## 2020-07-24 NOTE — Progress Notes (Signed)
Clinical Summary Brandon Warren is a 69 y.o.male seen today for follow up of the following medical problems.   1. . CAD - 2005 PCI to OM with DES - cath 02/2018 with 20% LM, 20% prox LCX with a widely patent stent - no recent exertional chest pains  2. Hyperlipidemia - muscle cramps on lipitor, zocor previously - has been on zetia, 03/2020 LDL 95  3. HTN - high bps at home recently - high sodium intake  4. Palpitations - feeling of heart racing at times, associated with pressure upper midchest.  - HR up to 140 with pulse ox. Can last up to 10 min. Some lightheadedness. Sudden onset and termination - few cups of coffee a daily, rare EtOH.   5. Obesity -seeing bariatric doctor.   6. CKD  - Cr up to 1.84 - upcoming labs - does not take lasix but has available prn  7. OSA - has cpap, does not have a doctor following - got cpap 4 years ago.   SH: had covid shot x 2, awaiting moderna Daughter is a Ecologist.   Past Medical History:  Diagnosis Date  . Arthritis   . Back pain   . BPH (benign prostatic hyperplasia)   . CAD S/P percutaneous coronary angioplasty    a. 2005: PCI OM - Zomax study stent DES  2.5 x16 mm. b. cath in 02/2018 showing 20% ISR of LCx stent and 20% stenosis along mid-distal LM.   Marland Kitchen Controlled type 2 diabetes with neuropathy ([place])   . Edema of both lower extremities   . Food allergy   . Gout   . Hypercholesterolemia   . Hypertension   . Hypothyroidism   . Kidney disease   . MI (myocardial infarction) ([place])    in his 70s  . Sleep apnea   . SOB (shortness of breath)      Allergies  Allergen Reactions  . Meat Extract Diarrhea, Nausea Only and Other (See Comments)    alphagal reaction  . Lipitor [Atorvastatin] Other (See Comments)    Muscle cramps  . Simvastatin Other (See Comments)    Muscle cramps      Current Outpatient Medications  Medication Sig Dispense Refill  . acetaminophen (TYLENOL) 500 MG tablet Take 1,000 mg by  mouth 2 (two) times daily as needed (for pain.).    Marland Kitchen amLODipine (NORVASC) 5 MG tablet TAKE ONE TABLET (5MG  TOTAL) BY MOUTH TWICE DAILY 180 tablet 3  . aspirin EC 81 MG tablet Take 81 mg by mouth every evening. Reported on 12/18/2015    . carvedilol (COREG) 12.5 MG tablet TAKE ONE TABLET (12.5 MG TOTAL) BY MOUTHTWO TIMES DAILY WITH A MEAL. STOP METOPROLOL. 180 tablet 3  . Cholecalciferol (VITAMIN D3) 5000 units CAPS Take 1 capsule (5,000 Units total) by mouth daily. 90 capsule 0  . diazepam (VALIUM) 10 MG tablet Take 10 mg by mouth every 6 (six) hours as needed for anxiety.    Marland Kitchen ezetimibe (ZETIA) 10 MG tablet Take 10 mg by mouth at bedtime.     . finasteride (PROSCAR) 5 MG tablet Take 5 mg by mouth daily.     . furosemide (LASIX) 20 MG tablet Take 20 mg daily if you have more than 3 lbs weight gain in 24 hrs 30 tablet 3  . hydrochlorothiazide (HYDRODIURIL) 25 MG tablet TAKE ONE TABLET (25MG  TOTAL) BY MOUTH DAILY 90 tablet 0  . ibuprofen (ADVIL,MOTRIN) 200 MG tablet Take 400 mg by mouth  2 (two) times daily as needed (for pain.).    Marland Kitchen Icosapent Ethyl (VASCEPA) 1 g CAPS Take 2 capsules (2 g total) by mouth 2 (two) times daily with a meal. 120 capsule 6  . levothyroxine (SYNTHROID) 25 MCG tablet TAKE ONE TABLET (25MCG TOTAL) BY MOUTH DAILY BEFORE BREAKFAST. TAKE ALONG WITH 200MCG FOR TOTAL DOSE OF 225MCG. 90 tablet 0  . levothyroxine (SYNTHROID, LEVOTHROID) 200 MCG tablet TAKE ONE TABLET BY MOUTH EVERY DAY BEFORE BREAKFAST 90 tablet 0  . losartan (COZAAR) 100 MG tablet TAKE ONE TABLET (100MG  TOTAL) BY MOUTH DAILY 7 tablet 0  . metFORMIN (GLUCOPHAGE) 500 MG tablet Take 1 tablet (500 mg total) by mouth 2 (two) times daily with breakfast and lunch. 60 tablet 0  . omega-3 acid ethyl esters (LOVAZA) 1 g capsule TAKE ONE CAPSULE BY MOUTH EVERY 12 HOURS 7 capsule 0  . tamsulosin (FLOMAX) 0.4 MG CAPS capsule TAKE ONE (1) CAPSULE BY MOUTH TWICE A DAY. (EVERY 12 HOURS.) 180 capsule 11  . Vitamin D,  Ergocalciferol, (DRISDOL) 1.25 MG (50000 UNIT) CAPS capsule Take 1 capsule (50,000 Units total) by mouth every 7 (seven) days. 4 capsule 0   No current facility-administered medications for this visit.     Past Surgical History:  Procedure Laterality Date  . APPENDECTOMY    . CATARACT EXTRACTION Left   . COLONOSCOPY N/A 02/01/2014   Procedure: COLONOSCOPY;  Surgeon: Daneil Dolin, MD;  [facility];  Service: Endoscopy;  Laterality: N/A;  8:30  . CORONARY ANGIOPLASTY WITH STENT PLACEMENT  2005   PCI- OM1 Zomax study stent 2.5 x16 mm.  Marland Kitchen LEFT HEART CATH AND CORONARY ANGIOGRAPHY N/A 03/15/2018   Procedure: LEFT HEART CATH AND CORONARY ANGIOGRAPHY;  Surgeon: Leonie Man, MD;  Location: [facility];  Service: Cardiovascular;  Laterality: N/A;     Allergies  Allergen Reactions  . Meat Extract Diarrhea, Nausea Only and Other (See Comments)    alphagal reaction  . Lipitor [Atorvastatin] Other (See Comments)    Muscle cramps  . Simvastatin Other (See Comments)    Muscle cramps       Family History  Problem Relation Age of Onset  . Colon polyps Mother   . Hypertension Mother   . High Cholesterol Father   . Heart disease Father   . Colon cancer Neg Hx      Social History Brandon Warren reports that he quit smoking about 16 years ago. His smoking use included cigarettes. He has a 60.00 pack-year smoking history. He has never used smokeless tobacco. Brandon Warren reports current alcohol use.   Review of Systems CONSTITUTIONAL: No weight loss, fever, chills, weakness or fatigue.  HEENT: Eyes: No visual loss, blurred vision, double vision or yellow sclerae.No hearing loss, sneezing, congestion, runny nose or sore throat.  SKIN: No rash or itching.  CARDIOVASCULAR: per hpi RESPIRATORY: No shortness of breath, cough or sputum.  GASTROINTESTINAL: No anorexia, nausea, vomiting or diarrhea. No abdominal pain or blood.  GENITOURINARY: No burning on urination, no  polyuria NEUROLOGICAL: No headache, dizziness, syncope, paralysis, ataxia, numbness or tingling in the extremities. No change in bowel or bladder control.  MUSCULOSKELETAL: No muscle, back pain, joint pain or stiffness.  LYMPHATICS: No enlarged nodes. No history of splenectomy.  PSYCHIATRIC: No history of depression or anxiety.  ENDOCRINOLOGIC: No reports of sweating, cold or heat intolerance. No polyuria or polydipsia.  Marland Kitchen   Physical Examination Today's Vitals   07/24/20 0814  BP: 134/88  Pulse: 88  SpO2: 96%  Weight: 297 lb (134.7 kg)  Height: 5\' 10"  (1.778 m)   Body mass index is 42.62 kg/m.  Gen: resting comfortably, no acute distress HEENT: no scleral icterus, pupils equal round and reactive, no palptable cervical adenopathy,  CV: RRR, no m/r/g, no jvd Resp: Clear to auscultation bilaterally GI: abdomen is soft, non-tender, non-distended, normal bowel sounds, no hepatosplenomegaly MSK: extremities are warm, no edema.  Skin: warm, no rash Neuro:  no focal deficits Psych: appropriate affect     Assessment and Plan  1. Palpitations - symptoms consistent with symptomatic arrhythmia. Occurs every 2-3 weeks, though fairly sporadic - obtain 30 day event monitor to further evaluate - discussed weaning caffeine  2. CAD - no exertional chest pains, continue current meds  3. Hyperlipidemia - reasonable control given limitations, did not tolerate statins on zetia - continue current therapy  4. HTN - above goal, increase coreg to 25mg  bid - uptrend in Cr, would not adjust diuretic at this time  5. OSA - needs to establish with pulmonary for regular follow up, has not been evaluated in a few years.       Arnoldo Lenis, M.D.

## 2020-07-24 NOTE — Addendum Note (Signed)
Addended by: Barbarann Ehlers A on: 07/24/2020 03:09 PM   Modules accepted: Orders

## 2020-07-24 NOTE — Patient Instructions (Addendum)
Medication Instructions:  INCREASE Coreg to 25 mg twice a day  *If you need a refill on your cardiac medications before your next appointment, please call your pharmacy*   Lab Work: None today If you have labs (blood work) drawn today and your tests are completely normal, you will receive your results only by: Marland Kitchen MyChart Message (if you have MyChart) OR . A paper copy in the mail If you have any lab test that is abnormal or we need to change your treatment, we will call you to review the results.   Testing/Procedures: Your physician has recommended that you wear an event monitor for 30 days-PREVENTICE. Event monitors are medical devices that record the heart's electrical activity. Doctors most often [place] these monitors to diagnose arrhythmias. Arrhythmias are problems with the speed or rhythm of the heartbeat. The monitor is a small, portable device. You can wear one while you do your normal daily activities. This is usually used to diagnose what is causing palpitations/syncope (passing out).   You have been referred to Pulmonary.They will call you for an appointment.   Follow-Up: At [facility], you and your health needs are our priority.  As part of our continuing mission to provide you with exceptional heart care, we have created designated Provider Care Teams.  These Care Teams include your primary Cardiologist (physician) and Advanced Practice Providers (APPs -  Physician Assistants and Nurse Practitioners) who all work together to provide you with the care you need, when you need it.  We recommend signing up for the patient portal called "MyChart".  Sign up information is provided on this After Visit Summary.  MyChart is used to connect with patients for Virtual Visits (Telemedicine).  Patients are able to view lab/test results, encounter notes, upcoming appointments, etc.  Non-urgent messages can be sent to your provider as well.   To learn more about what you can do with MyChart, go  to [email or web address].    Your next appointment:   6 week(s)  The format for your next appointment:   In Person  Provider:   Carlyle Dolly, MD, Bernerd Pho, PA-C or Ermalinda Barrios, PA-C   Other Instructions None      Thank you for choosing [facility] !

## 2020-07-26 DIAGNOSIS — I10 Essential (primary) hypertension: Secondary | ICD-10-CM | POA: Diagnosis not present

## 2020-07-26 DIAGNOSIS — I2510 Atherosclerotic heart disease of native coronary artery without angina pectoris: Secondary | ICD-10-CM | POA: Diagnosis not present

## 2020-07-26 DIAGNOSIS — E119 Type 2 diabetes mellitus without complications: Secondary | ICD-10-CM | POA: Diagnosis not present

## 2020-07-26 DIAGNOSIS — E063 Autoimmune thyroiditis: Secondary | ICD-10-CM | POA: Diagnosis not present

## 2020-08-01 ENCOUNTER — Other Ambulatory Visit (INDEPENDENT_AMBULATORY_CARE_PROVIDER_SITE_OTHER): Payer: Self-pay | Admitting: Bariatrics

## 2020-08-01 DIAGNOSIS — E559 Vitamin D deficiency, unspecified: Secondary | ICD-10-CM

## 2020-08-02 DIAGNOSIS — G4733 Obstructive sleep apnea (adult) (pediatric): Secondary | ICD-10-CM | POA: Diagnosis not present

## 2020-08-22 DIAGNOSIS — R002 Palpitations: Secondary | ICD-10-CM

## 2020-09-13 ENCOUNTER — Encounter: Payer: Self-pay | Admitting: Cardiology

## 2020-09-13 ENCOUNTER — Other Ambulatory Visit: Payer: Self-pay

## 2020-09-13 ENCOUNTER — Ambulatory Visit: Payer: Medicare HMO | Admitting: Cardiology

## 2020-09-13 VITALS — BP 174/98 | HR 74 | Ht 70.0 in | Wt 294.0 lb

## 2020-09-13 DIAGNOSIS — G4733 Obstructive sleep apnea (adult) (pediatric): Secondary | ICD-10-CM | POA: Diagnosis not present

## 2020-09-13 DIAGNOSIS — G4730 Sleep apnea, unspecified: Secondary | ICD-10-CM

## 2020-09-13 DIAGNOSIS — R002 Palpitations: Secondary | ICD-10-CM | POA: Diagnosis not present

## 2020-09-13 DIAGNOSIS — I10 Essential (primary) hypertension: Secondary | ICD-10-CM

## 2020-09-13 MED ORDER — HYDROCHLOROTHIAZIDE 25 MG PO TABS: 25.0000 mg | ORAL_TABLET | Freq: Every day | ORAL | 3 refills | Status: DC

## 2020-09-13 NOTE — Progress Notes (Signed)
Clinical Summary Mr. Niazi is a 69 y.o.male seen today for follow up of the following medical problems. This is a focused visit on recent symptoms of palpitations as well as HTN.    1. HTN - has not taken meds yet today.  - compliant with meds - on high dose HCTZ, dose report some fairly frequent muscle cramping. Can have some orthostatic dizzienss at times.    2. Palpitations - feeling of heart racing at times, associated with pressure upper midchest.  - HR up to 140 with pulse ox. Can last up to 10 min. Some lightheadedness. Sudden onset and termination - few cups of coffee a daily, rare EtOH.   - 06/2020 event monitor benign, just rare PVCs - one episode since last visit, HR was up to 155 for about 2 minutes.     3. OSA - has cpap, does not have a doctor following - got cpap 4 years ago.  - needs to establish with pulmonary for f/u       Past Medical History:  Diagnosis Date  . Arthritis   . Back pain   . BPH (benign prostatic hyperplasia)   . CAD S/P percutaneous coronary angioplasty    a. 2005: PCI OM - Zomax study stent DES  2.5 x16 mm. b. cath in 02/2018 showing 20% ISR of LCx stent and 20% stenosis along mid-distal LM.   Marland Kitchen Controlled type 2 diabetes with neuropathy ([place])   . Edema of both lower extremities   . Food allergy   . Gout   . Hypercholesterolemia   . Hypertension   . Hypothyroidism   . Kidney disease   . MI (myocardial infarction) ([place])    in his 75s  . Sleep apnea   . SOB (shortness of breath)      Allergies  Allergen Reactions  . Meat Extract Diarrhea, Nausea Only and Other (See Comments)    alphagal reaction  . Lipitor [Atorvastatin] Other (See Comments)    Muscle cramps  . Simvastatin Other (See Comments)    Muscle cramps      Current Outpatient Medications  Medication Sig Dispense Refill  . acetaminophen (TYLENOL) 500 MG tablet Take 1,000 mg by mouth 2 (two) times daily as needed (for pain.).    Marland Kitchen amLODipine  (NORVASC) 5 MG tablet TAKE ONE TABLET (5MG  TOTAL) BY MOUTH TWICE DAILY 180 tablet 3  . aspirin EC 81 MG tablet Take 81 mg by mouth every evening. Reported on 12/18/2015    . carvedilol (COREG) 25 MG tablet Take 1 tablet (25 mg total) by mouth 2 (two) times daily. 180 tablet 3  . Cholecalciferol (VITAMIN D3) 5000 units CAPS Take 1 capsule (5,000 Units total) by mouth daily. 90 capsule 0  . diazepam (VALIUM) 10 MG tablet Take 10 mg by mouth every 6 (six) hours as needed for anxiety.    Marland Kitchen ezetimibe (ZETIA) 10 MG tablet Take 10 mg by mouth at bedtime.     . finasteride (PROSCAR) 5 MG tablet Take 5 mg by mouth daily.     . furosemide (LASIX) 20 MG tablet Take 20 mg daily if you have more than 3 lbs weight gain in 24 hrs 30 tablet 3  . hydrochlorothiazide (HYDRODIURIL) 50 MG tablet Take 1 tablet (50 mg total) by mouth daily. 90 tablet 3  . ibuprofen (ADVIL,MOTRIN) 200 MG tablet Take 400 mg by mouth 2 (two) times daily as needed (for pain.).    Marland Kitchen Icosapent Ethyl (VASCEPA) 1  g CAPS Take 2 capsules (2 g total) by mouth 2 (two) times daily with a meal. 120 capsule 6  . levothyroxine (SYNTHROID) 25 MCG tablet TAKE ONE TABLET (25MCG TOTAL) BY MOUTH DAILY BEFORE BREAKFAST. TAKE ALONG WITH 200MCG FOR TOTAL DOSE OF 225MCG. 90 tablet 0  . levothyroxine (SYNTHROID, LEVOTHROID) 200 MCG tablet TAKE ONE TABLET BY MOUTH EVERY DAY BEFORE BREAKFAST 90 tablet 0  . losartan (COZAAR) 100 MG tablet TAKE ONE TABLET (100MG  TOTAL) BY MOUTH DAILY 7 tablet 0  . metFORMIN (GLUCOPHAGE) 500 MG tablet Take 1 tablet (500 mg total) by mouth 2 (two) times daily with breakfast and lunch. 60 tablet 0  . omega-3 acid ethyl esters (LOVAZA) 1 g capsule TAKE ONE CAPSULE BY MOUTH EVERY 12 HOURS 7 capsule 0  . tamsulosin (FLOMAX) 0.4 MG CAPS capsule TAKE ONE (1) CAPSULE BY MOUTH TWICE A DAY. (EVERY 12 HOURS.) 180 capsule 11  . TRULICITY 3 OX/7.3ZH SOPN INJECT 0.5 ML (3 MG) SUBCUTANEOUSLY EVERY 7 DAYS.    Marland Kitchen Vitamin D, Ergocalciferol, (DRISDOL)  1.25 MG (50000 UNIT) CAPS capsule Take 1 capsule (50,000 Units total) by mouth every 7 (seven) days. 4 capsule 0   No current facility-administered medications for this visit.     Past Surgical History:  Procedure Laterality Date  . APPENDECTOMY    . CATARACT EXTRACTION Left   . COLONOSCOPY N/A 02/01/2014   Procedure: COLONOSCOPY;  Surgeon: Daneil Dolin, MD;  [facility];  Service: Endoscopy;  Laterality: N/A;  8:30  . CORONARY ANGIOPLASTY WITH STENT PLACEMENT  2005   PCI- OM1 Zomax study stent 2.5 x16 mm.  Marland Kitchen LEFT HEART CATH AND CORONARY ANGIOGRAPHY N/A 03/15/2018   Procedure: LEFT HEART CATH AND CORONARY ANGIOGRAPHY;  Surgeon: Leonie Man, MD;  Location: [facility];  Service: Cardiovascular;  Laterality: N/A;     Allergies  Allergen Reactions  . Meat Extract Diarrhea, Nausea Only and Other (See Comments)    alphagal reaction  . Lipitor [Atorvastatin] Other (See Comments)    Muscle cramps  . Simvastatin Other (See Comments)    Muscle cramps       Family History  Problem Relation Age of Onset  . Colon polyps Mother   . Hypertension Mother   . High Cholesterol Father   . Heart disease Father   . Colon cancer Neg Hx      Social History Mr. Spratley reports that he quit smoking about 16 years ago. His smoking use included cigarettes. He has a 60.00 pack-year smoking history. He has never used smokeless tobacco. Mr. Fretwell reports current alcohol use.   Review of Systems CONSTITUTIONAL: No weight loss, fever, chills, weakness or fatigue.  HEENT: Eyes: No visual loss, blurred vision, double vision or yellow sclerae.No hearing loss, sneezing, congestion, runny nose or sore throat.  SKIN: No rash or itching.  CARDIOVASCULAR: per hpi RESPIRATORY: No shortness of breath, cough or sputum.  GASTROINTESTINAL: No anorexia, nausea, vomiting or diarrhea. No abdominal pain or blood.  GENITOURINARY: No burning on urination, no polyuria NEUROLOGICAL: No  headache, dizziness, syncope, paralysis, ataxia, numbness or tingling in the extremities. No change in bowel or bladder control.  MUSCULOSKELETAL: No muscle, back pain, joint pain or stiffness.  LYMPHATICS: No enlarged nodes. No history of splenectomy.  PSYCHIATRIC: No history of depression or anxiety.  ENDOCRINOLOGIC: No reports of sweating, cold or heat intolerance. No polyuria or polydipsia.  Marland Kitchen   Physical Examination Today's Vitals   09/13/20 1413  BP: Marland Kitchen)  174/98  Pulse: 74  SpO2: 98%  Weight: 294 lb (133.4 kg)  Height: 5\' 10"  (1.778 m)   Body mass index is 42.18 kg/m.  Gen: resting comfortably, no acute distress HEENT: no scleral icterus, pupils equal round and reactive, no palptable cervical adenopathy,  CV: RRR, no m/r/g, no jvd Resp: Clear to auscultation bilaterally GI: abdomen is soft, non-tender, non-distended, normal bowel sounds, no hepatosplenomegaly MSK: extremities are warm, no edema.  Skin: warm, no rash Neuro:  no focal deficits Psych: appropriate affect   Diagnostic Studies     Assessment and Plan   1. Palpitations - overall benign monitor, just rare PVCs - isoalted episode since last visit, monitor at this time. Could increase coreg if needed in the future, would accept higher doses given his BMI  2. HTN - elevated but has not taken meds yet - orthostatic dizziness, muscle cramps at times. On high dose HCTZ, will lowr th 25mg  daily - call with bp log next week, consider aldactone if not at goal vs increasing coreg.   3. OSA - refer to pulmonary    Arnoldo Lenis, M.D.

## 2020-09-13 NOTE — Patient Instructions (Signed)
Medication Instructions:  DECREASE HCTZ to 25 mg daily   *If you need a refill on your cardiac medications before your next appointment, please call your pharmacy*   Lab Work: None today If you have labs (blood work) drawn today and your tests are completely normal, you will receive your results only by:  [place] (if you have MyChart) OR  A paper copy in the mail If you have any lab test that is abnormal or we need to change your treatment, we will call you to review the results.   Testing/Procedures: None today   Follow-Up: At [facility], you and your health needs are our priority.  As part of our continuing mission to provide you with exceptional heart care, we have created designated Provider Care Teams.  These Care Teams include your primary Cardiologist (physician) and Advanced Practice Providers (APPs -  Physician Assistants and Nurse Practitioners) who all work together to provide you with the care you need, when you need it.  We recommend signing up for the patient portal called "MyChart".  Sign up information is provided on this After Visit Summary.  MyChart is used to connect with patients for Virtual Visits (Telemedicine).  Patients are able to view lab/test results, encounter notes, upcoming appointments, etc.  Non-urgent messages can be sent to your provider as well.   To learn more about what you can do with MyChart, go to [email or web address].    Your next appointment:   6 month(s)  The format for your next appointment:   In Person  Provider:   Carlyle Dolly, MD   Other Instructions Keep a blood pressure log daily for 1 week.Call us with the readings.

## 2020-09-18 ENCOUNTER — Telehealth: Payer: Self-pay | Admitting: Cardiology

## 2020-09-18 DIAGNOSIS — Z79899 Other long term (current) drug therapy: Secondary | ICD-10-CM

## 2020-09-18 NOTE — Telephone Encounter (Signed)
Patient was told to speak with [facility], Dr. Harl Bowie nurse, to give information about BP readings.

## 2020-09-18 NOTE — Telephone Encounter (Addendum)
Pt's HCTZ was reduced to 25 mg daily on 09/13/20.   Today he calls with BP's  163/78, 142/78. 166/81, 159/80, 173/87, 143/74, 167/88, 152/87    He has had no weight gain and states other than his rising BP's, he feels well.   Bellow is Dr.Branch's note from 09/13/20 office visit:   HTN - elevated but has not taken meds yet - orthostatic dizziness, muscle cramps at times. On high dose HCTZ, will lowr th 25mg  daily - call with bp log next week, consider aldactone if not at goal vs increasing coreg.      I will forward to [place] who is covering in Dr.Branch's absence.

## 2020-09-18 NOTE — Telephone Encounter (Signed)
Most recent heart rates are: 79,69,83,82,76,78,71,80,79    He will get BMET tomorrow (place in Dr.Branch's name) which I will show to Dr.Mc dowell.    He also reports new diagnosis of gout after starting HCTZ.

## 2020-09-18 NOTE — Telephone Encounter (Signed)
Patient of Dr. Harl Bowie.  I reviewed the chart.  Before making any medication adjustments he needs to have a follow-up BMET, his last creatinine was 1.84.  Continue current regimen for now pending labs, would also report what recent heart rates have been as well.

## 2020-09-26 ENCOUNTER — Other Ambulatory Visit (HOSPITAL_COMMUNITY)
Admission: RE | Admit: 2020-09-26 | Discharge: 2020-09-26 | Disposition: A | Discharge status: Home / Self Care | Payer: Medicare HMO | Source: Ambulatory Visit | Attending: Cardiology | Admitting: Cardiology

## 2020-09-26 ENCOUNTER — Encounter: Payer: Self-pay | Admitting: Cardiology

## 2020-09-26 ENCOUNTER — Other Ambulatory Visit: Payer: Self-pay

## 2020-09-26 DIAGNOSIS — Z79899 Other long term (current) drug therapy: Secondary | ICD-10-CM | POA: Insufficient documentation

## 2020-09-26 LAB — BASIC METABOLIC PANEL
Anion gap: 13 (ref 5–15)
BUN: 41 mg/dL — ABNORMAL HIGH (ref 8–23)
CO2: 19 mmol/L — ABNORMAL LOW (ref 22–32)
Calcium: 9.4 mg/dL (ref 8.9–10.3)
Chloride: 104 mmol/L (ref 98–111)
Creatinine, Ser: 2.03 mg/dL — ABNORMAL HIGH (ref 0.61–1.24)
GFR, Estimated: 35 mL/min — ABNORMAL LOW (ref >60)
Glucose, Bld: 139 mg/dL — ABNORMAL HIGH (ref 70–99)
Potassium: 4.1 mmol/L (ref 3.5–5.1)
Sodium: 136 mmol/L (ref 135–145)

## 2020-09-28 ENCOUNTER — Telehealth: Payer: Self-pay

## 2020-09-28 MED ORDER — HYDROCHLOROTHIAZIDE 25 MG PO TABS: 12.5000 mg | ORAL_TABLET | Freq: Every day | ORAL | 3 refills | Status: DC

## 2020-09-28 MED ORDER — CARVEDILOL 25 MG PO TABS: 37.5000 mg | ORAL_TABLET | Freq: Two times a day (BID) | ORAL | 3 refills | Status: DC

## 2020-09-28 NOTE — Telephone Encounter (Signed)
BMET done 12/1, needs to be resulted by Dr.Branch, will forward message to him.

## 2020-09-28 NOTE — Telephone Encounter (Signed)
Per Dr.Branch, decrease HCTZ to 12.5 mg daily and increase coreg to 37.5 mg twice a day. Keep bp log and call back with results in 1 week. Patient agrees with plan.

## 2020-09-28 NOTE — Telephone Encounter (Signed)
Patient called requesting to speak with [facility]. States that he has been waiting for a phone call in regards to his BP.

## 2020-09-28 NOTE — Telephone Encounter (Signed)
I spoke with patient and he will decrease HCTZ to 12.5 mg daily and increase Coreg to 37.5 mg twice a day.He will also call back in 1 week with BP readings.

## 2020-09-28 NOTE — Telephone Encounter (Signed)
-----   Message from Arnoldo Lenis, MD sent at 09/28/2020 12:34 PM EST ----- Some decrease in renal function, I would lower HCTZ further to 12.5mg  daily. Can we increase his coreg to 37.5mg  bid and update [place] in 1 week on bp's  Zandra Abts MD

## 2020-10-08 ENCOUNTER — Telehealth: Payer: Self-pay

## 2020-10-08 DIAGNOSIS — Z79899 Other long term (current) drug therapy: Secondary | ICD-10-CM

## 2020-10-08 MED ORDER — HYDROCHLOROTHIAZIDE 25 MG PO TABS: 12.5000 mg | ORAL_TABLET | Freq: Every day | ORAL | 3 refills | Status: DC

## 2020-10-08 MED ORDER — HYDRALAZINE HCL 25 MG PO TABS: 25.0000 mg | ORAL_TABLET | Freq: Three times a day (TID) | ORAL | 3 refills | Status: DC

## 2020-10-08 NOTE — Telephone Encounter (Signed)
BP's remain elevated, can he start hydralazine 25mg  three times daily, continue other meds. Needs another bmet in 1 week to recheck kidney function, if not improved may have to stop his HCTZ   Zandra Abts MD

## 2020-10-08 NOTE — Telephone Encounter (Addendum)
October 08, 2020  Arnoldo Lenis, MD    10:12 AM Note   BP's remain elevated, can he start hydralazine 25mg  three times daily, continue other meds. Needs another bmet in 1 week to recheck kidney function, if not improved may have to stop his HCTZ   Zandra Abts MD      Left message for patient to return call    Patient returned call.He will start hydralazine 25 mg TID.He requests to stop HCTZ, I have messaged Dr.Branch

## 2020-10-11 ENCOUNTER — Telehealth: Payer: Self-pay

## 2020-10-11 NOTE — Telephone Encounter (Signed)
-----   Message from Arnoldo Lenis, MD sent at 10/10/2020  1:30 PM EST ----- Regarding: RE: wants off hctz Ok to stop HCTZ, we can titrate the hydralazine we just started further for his bp  J BrancH MD ----- Message ----- From: Bernita Raisin, RN Sent: 10/08/2020  10:43 AM EST To: Arnoldo Lenis, MD Subject: wants off hctz                                 Pt wants of HCTZ all together, says he has gout and does not like the side effects.

## 2020-10-11 NOTE — Telephone Encounter (Signed)
Patent notified, will stop HCTZ and monitor his BP

## 2020-10-26 DIAGNOSIS — I2510 Atherosclerotic heart disease of native coronary artery without angina pectoris: Secondary | ICD-10-CM | POA: Diagnosis not present

## 2020-10-26 DIAGNOSIS — I10 Essential (primary) hypertension: Secondary | ICD-10-CM | POA: Diagnosis not present

## 2020-10-26 DIAGNOSIS — E063 Autoimmune thyroiditis: Secondary | ICD-10-CM | POA: Diagnosis not present

## 2020-10-26 DIAGNOSIS — E119 Type 2 diabetes mellitus without complications: Secondary | ICD-10-CM | POA: Diagnosis not present

## 2020-11-14 ENCOUNTER — Other Ambulatory Visit: Payer: Self-pay | Admitting: Urology

## 2020-11-19 ENCOUNTER — Ambulatory Visit: Payer: Medicare HMO | Admitting: Cardiology

## 2020-11-19 ENCOUNTER — Telehealth: Payer: Self-pay

## 2020-11-19 MED ORDER — HYDRALAZINE HCL 50 MG PO TABS: 50.0000 mg | ORAL_TABLET | Freq: Three times a day (TID) | ORAL | 3 refills | Status: DC

## 2020-11-19 NOTE — Telephone Encounter (Signed)
Brandon Lenis, MD to Me      11/19/20 3:36 PM BP's running too high, can he increase hydralazine to 50mg  tid. I understand his concerns about the lab, if uncomfortable at [facility] he could try [facility] right across the street   Zandra Abts MD       I left message for patient that Dr.Branch wanted to increase his Hydralazine to 50 mg TID and e-scribed to [employer].

## 2020-11-20 NOTE — Telephone Encounter (Signed)
I spoke with patient.he will increase hydralazine to 50 mg TID and follow BP's.Hew wants to wait 2 weeks to go to the lab as he has concern going in public as his wife is immunocompromised .

## 2020-11-24 DIAGNOSIS — E119 Type 2 diabetes mellitus without complications: Secondary | ICD-10-CM | POA: Diagnosis not present

## 2020-11-24 DIAGNOSIS — E063 Autoimmune thyroiditis: Secondary | ICD-10-CM | POA: Diagnosis not present

## 2020-11-24 DIAGNOSIS — I10 Essential (primary) hypertension: Secondary | ICD-10-CM | POA: Diagnosis not present

## 2020-11-24 DIAGNOSIS — I2510 Atherosclerotic heart disease of native coronary artery without angina pectoris: Secondary | ICD-10-CM | POA: Diagnosis not present

## 2020-12-04 ENCOUNTER — Telehealth: Payer: Self-pay

## 2020-12-04 MED ORDER — HYDRALAZINE HCL 50 MG PO TABS: 75.0000 mg | ORAL_TABLET | Freq: Three times a day (TID) | ORAL | 3 refills | Status: DC

## 2020-12-04 NOTE — Telephone Encounter (Signed)
BP's remain elevated, increase hydralazine to 75mg  tid and update [place] in 1 week on bp's   Zandra Abts MD

## 2020-12-04 NOTE — Telephone Encounter (Signed)
Brandon Lenis, MD to Me     1:38 PM Note   BP's remain elevated, increase hydralazine to 75mg  tid and update [place] in 1 week on bp's   Brandon Abts MD     I spoke with patient and he agrees to increase hydralazine to 75 mg TID and call back in a week with BP readings.He has both hydralazine 50 mg and 25 mg tablets at home.

## 2020-12-18 ENCOUNTER — Other Ambulatory Visit: Payer: Self-pay

## 2020-12-18 MED ORDER — HYDRALAZINE HCL 100 MG PO TABS: 100.0000 mg | ORAL_TABLET | Freq: Three times a day (TID) | ORAL | 3 refills | Status: DC

## 2020-12-18 NOTE — Progress Notes (Signed)
Dose change Hydralazine 75 mg to 100 mg per Dr. Harl Bowie

## 2020-12-24 DIAGNOSIS — E7849 Other hyperlipidemia: Secondary | ICD-10-CM | POA: Diagnosis not present

## 2020-12-24 DIAGNOSIS — I2510 Atherosclerotic heart disease of native coronary artery without angina pectoris: Secondary | ICD-10-CM | POA: Diagnosis not present

## 2020-12-24 DIAGNOSIS — E119 Type 2 diabetes mellitus without complications: Secondary | ICD-10-CM | POA: Diagnosis not present

## 2020-12-24 DIAGNOSIS — E063 Autoimmune thyroiditis: Secondary | ICD-10-CM | POA: Diagnosis not present

## 2020-12-26 DIAGNOSIS — G4733 Obstructive sleep apnea (adult) (pediatric): Secondary | ICD-10-CM | POA: Diagnosis not present

## 2020-12-31 ENCOUNTER — Other Ambulatory Visit: Payer: Self-pay | Admitting: *Deleted

## 2020-12-31 MED ORDER — AMLODIPINE BESYLATE 5 MG PO TABS
5.0000 mg | ORAL_TABLET | Freq: Two times a day (BID) | ORAL | 3 refills | Status: DC
Start: 2020-12-31 — End: 2021-11-15

## 2021-01-02 ENCOUNTER — Encounter: Payer: Self-pay | Admitting: Internal Medicine

## 2021-01-02 DIAGNOSIS — Z79899 Other long term (current) drug therapy: Secondary | ICD-10-CM

## 2021-01-02 DIAGNOSIS — I10 Essential (primary) hypertension: Secondary | ICD-10-CM

## 2021-01-04 NOTE — Telephone Encounter (Signed)
BP remains elevatede, any additional changes would need to see some recent blood work. Has he had blood work within last month with pcp, if not can we get a BMET and Mg please   Zandra Abts MD

## 2021-01-04 NOTE — Telephone Encounter (Signed)
Spoke with pt who states he has not had recent labs d/t Covid, and not wanting to go out. Pt states that he will have labs done next week at [place] [facility].

## 2021-01-08 DIAGNOSIS — E119 Type 2 diabetes mellitus without complications: Secondary | ICD-10-CM | POA: Diagnosis not present

## 2021-01-08 DIAGNOSIS — E063 Autoimmune thyroiditis: Secondary | ICD-10-CM | POA: Diagnosis not present

## 2021-01-08 DIAGNOSIS — E669 Obesity, unspecified: Secondary | ICD-10-CM | POA: Diagnosis not present

## 2021-01-08 DIAGNOSIS — M2550 Pain in unspecified joint: Secondary | ICD-10-CM | POA: Diagnosis not present

## 2021-01-11 DIAGNOSIS — M2550 Pain in unspecified joint: Secondary | ICD-10-CM | POA: Diagnosis not present

## 2021-01-11 DIAGNOSIS — I10 Essential (primary) hypertension: Secondary | ICD-10-CM | POA: Diagnosis not present

## 2021-01-21 ENCOUNTER — Telehealth: Payer: Self-pay

## 2021-01-21 NOTE — Telephone Encounter (Signed)
-----   Message from Arnoldo Lenis, MD sent at 01/21/2021  4:04 PM EDT ----- Any recent home bp's. We received his labs from his pcp, his renal function would limit some of the other meds we may be able to use   Zandra Abts MD

## 2021-01-21 NOTE — Telephone Encounter (Signed)
Contacted patient who stated that he has not taken his bp in a week. I asked patient to please continue to keep a bp/hr log. Patient stated he will email new log in one week.

## 2021-01-31 NOTE — Telephone Encounter (Signed)
BP's somewhat variable but on average are in his goal range. I think given the reasonable bp's and limitations on other meds given his renal function we are ok where we are right now, would not make additionalt changes at this time   Zandra Abts MD

## 2021-02-01 ENCOUNTER — Telehealth: Payer: Self-pay

## 2021-02-01 NOTE — Telephone Encounter (Signed)
BP's somewhat variable but on average are in his goal range. I think given the reasonable bp's and limitations on other meds given his renal function we are ok where we are right now, would not make additionalt changes at this time   Zandra Abts MD   Patient contacted and verbalized understanding. Patient would like to still keep a bp log and update our office again in a few weeks.

## 2021-02-10 NOTE — Progress Notes (Signed)
Office Visit Note  Patient: Brandon Warren             Date of Birth: 03/25/1951           MRN: [id number]             PCP: Redmond School, MD Referring: Redmond School, MD Visit Date: 02/11/2021 Occupation: Water engineer  Subjective:  New Patient (Initial Visit) (Patient complains of joint pain and swelling- initially started in the great toe and was diagnosed with gout. Patient complains of pain moving between the two feet. More recently patient has noticed right knee pain, swelling, and redness- patient had right knee aspirated and the symptoms have recurred a few times. Patient also complains of right elbow pain, swelling, and redness. )   History of Present Illness: Brandon Warren is a 70 y.o. male with a history of hypertension, hypothyroidism, coronary artery disease, type 2 diabetes, hyperprolactinemia, and CKD stage IIIb here for evaluation of elevated sedimentation rate and joint pain in multiple sites.  He describes symptoms starting about 2 years ago initially with pain and inflammation in the left great toe originally diagnosed as gout.  He has inflammatory episodes alternating between the right and left feet initially but after several months started to develop pain and swelling in other areas.  He had right knee inflammation with associated warmth and had the aspiration apparently showing clear fluid.  The symptoms have recurred multiple times he describes it as swelling and warmth but pain is more radiating proximal and distal to the joint itself.  He had an episode with this kind of inflammation in the right elbow.  He had left shoulder pain with work-up was attributed to impingement syndrome symptoms have partially improved.  He now describes increasing symptoms in the right shoulder.  He does have radiation of numbness to the second and third digits on the right hand.  This sometimes causes weakness or dropping items.  He states individual episodes of joint swelling last 3 to 4  days do not seem to improve any more quickly when he takes colchicine then when he does not.  He is not on any maintenance urate lowering therapy.  Besides the joint pains he is also noticed developing small red lesions on both anterior shins.  No spontaneous changes and partial loss of the left great toenail.  He has increased redness and swelling of the feet and a little bit above the right ankle.  He does have diabetic neuropathy he attributes this too but the increasing swelling is newer.  Labs reviewed 12/2020 ESR 65 eGFR 37  Activities of Daily Living:  Patient reports morning stiffness for 5-10 minutes.   Patient Reports nocturnal pain.  Difficulty dressing/grooming: Reports Difficulty climbing stairs: Reports Difficulty getting out of chair: Reports Difficulty using hands for taps, buttons, cutlery, and/or writing: Reports  Review of Systems  Constitutional: Positive for fatigue.  HENT: Negative for mouth sores, mouth dryness and nose dryness.   Eyes: Positive for visual disturbance. Negative for pain, itching and dryness.  Respiratory: Positive for shortness of breath. Negative for cough, hemoptysis and difficulty breathing.   Cardiovascular: Positive for palpitations and swelling in legs/feet. Negative for chest pain.  Gastrointestinal: Positive for constipation and diarrhea. Negative for abdominal pain and blood in stool.  Endocrine: Negative for increased urination.  Genitourinary: Negative for painful urination.  Musculoskeletal: Positive for arthralgias, joint pain, joint swelling, myalgias, morning stiffness and myalgias. Negative for muscle weakness and muscle tenderness.  Skin:  Positive for rash. Negative for color change and redness.  Allergic/Immunologic: Negative for susceptible to infections.  Neurological: Positive for numbness, memory loss and weakness. Negative for dizziness and headaches.  Hematological: Negative for swollen glands.  Psychiatric/Behavioral:  Positive for confusion and sleep disturbance.    PMFS History:  Patient Active Problem List   Diagnosis Date Noted  . Elevated sedimentation rate 02/11/2021  . Bilateral shoulder pain 02/11/2021  . Bilateral foot pain 02/11/2021  . Bilateral hand pain 02/11/2021  . CKD (chronic kidney disease) stage 3, GFR 30-59 ml/min (HCC) 04/17/2020  . Diabetes mellitus type 2 in obese ([place]) 05/21/2017  . Hyperprolactinemia ([place]) 05/12/2017  . Chest pain 04/20/2016  . Encounter for screening colonoscopy 01/24/2014  . Pseudophakia, left eye 06/08/2013  . Status post laser cataract surgery of left eye 05/25/2013  . Bradycardia 05/20/2013  . Dizziness 05/20/2013  . Coronary artery disease involving native coronary artery of native heart with angina pectoris ([place]) 05/19/2013  . Essential hypertension, benign 05/19/2013  . Hypertriglyceridemia 05/19/2013  . Hypothyroidism 05/19/2013  . Morbid obesity ([place]) 05/19/2013  . Amblyopia of right eye 05/02/2013  . Combined form of senile cataract 05/02/2013    Past Medical History:  Diagnosis Date  . Arthritis   . Back pain   . BPH (benign prostatic hyperplasia)   . CAD S/P percutaneous coronary angioplasty    a. 2005: PCI OM - Zomax study stent DES  2.5 x16 mm. b. cath in 02/2018 showing 20% ISR of LCx stent and 20% stenosis along mid-distal LM.   Marland Kitchen Controlled type 2 diabetes with neuropathy ([place])   . Edema of both lower extremities   . Food allergy   . Gout   . Hypercholesterolemia   . Hypertension   . Hypothyroidism   . Kidney disease   . MI (myocardial infarction) ([place])    in his 53s  . Sleep apnea   . SOB (shortness of breath)     Family History  Problem Relation Age of Onset  . Colon polyps Mother   . Hypertension Mother   . High Cholesterol Father   . Heart disease Father   . COPD Father   . Healthy Other   . Healthy Daughter   . Colon cancer Neg Hx    Past Surgical History:  Procedure Laterality Date  . APPENDECTOMY    .  CATARACT EXTRACTION Left   . COLONOSCOPY N/A 02/01/2014   Procedure: COLONOSCOPY;  Surgeon: Daneil Dolin, MD;  Location: [facility];  Service: Endoscopy;  Laterality: N/A;  8:30  . CORONARY ANGIOPLASTY WITH STENT PLACEMENT  2005   PCI- OM1 Zomax study stent 2.5 x16 mm.  Marland Kitchen LEFT HEART CATH AND CORONARY ANGIOGRAPHY N/A 03/15/2018   Procedure: LEFT HEART CATH AND CORONARY ANGIOGRAPHY;  Surgeon: Leonie Man, MD;  Location: [place] [facility];  Service: Cardiovascular;  Laterality: N/A;   Social History   Social History Narrative  . Not on file    There is no immunization history on file for this patient.   Objective: Vital Signs: BP (!) 143/82 (BP Location: Right Arm, Patient Position: Sitting, Cuff Size: Large)   Pulse 72   Ht _0  (1.753 m)   Wt 296 lb (134.3 kg)   BMI 43.71 kg/m    Physical Exam Constitutional:      Appearance: He is obese.  HENT:     Right Ear: External ear normal.     Left Ear: External ear normal.  Mouth/Throat:     Mouth: Mucous membranes are moist.     Pharynx: Oropharynx is clear.  Eyes:     Conjunctiva/sclera: Conjunctivae normal.  Cardiovascular:     Rate and Rhythm: Normal rate and regular rhythm.  Pulmonary:     Effort: Pulmonary effort is normal.     Breath sounds: Normal breath sounds.  Skin:    General: Skin is warm and dry.     Comments: Mild erythema covering the dorsum of both feet, dry skin with hyperkeratosis on feet, left great toenail partially absent others normal  Neurological:     Mental Status: He is alert.     Motor: No weakness.     Deep Tendon Reflexes: Reflexes abnormal.     Comments: Dull knee jerk and ankle jerk reflexes bilaterally Decreased sensation to touch on both feet  Psychiatric:        Mood and Affect: Mood normal.     Musculoskeletal Exam:  Shoulders full ROM no tenderness to palpation positive neer's test Elbows full ROM no tenderness or swelling Wrists full ROM no tenderness or  swelling Fingers full ROM no tenderness or swelling Knees full ROM no tenderness or swelling, mild patellofemoral crepitus present Ankles full ROM no tenderness or swelling MTPs no focal change, foot overlying swelling and stiffness throughout   Investigation: No additional findings.  Imaging: XR Foot 2 Views Left  Result Date: 02/12/2021 Xray left foot 2 views Normal tibiotalar joint space and alignment. Small anterior osteophyte and small calcification or loose body present medially. No calcaneal changes or significant midfoot changes. Significant arthritis of the 1st MTP joint with asymmetric MTP narrowing and medial probably erosive changes unless postsurgical or traumatic. No erosive disease of other joints. Impression Mild OA of the foot, damage of 1st MTP suspect previous trauma or crystalline arthropathy  XR Foot 2 Views Right  Result Date: 02/12/2021 Xray right foot 2 views Normal tibiotalar joint space and alignment. Small anterior osteophytes. No calcaneal  Changes. Mild probably degenerative changes on dorsal midfoot joints. MTP and PIP joint spaces appear normal. No erosions are present. Impression Mild to moderate degenerative arthritis changes and mild dorsal soft tissue swelling  XR Hand 2 View Left  Result Date: 02/12/2021 Xray left hand 2 views Radiocarpal joint appears normal. Trapezium and trapezoid appear to have chronic fracture of a previous fusion. MCP joints with osteophytes and probable cystic changes. PIP and DIp joints are mildly affected, with periarticular calcifications vs small old avulsions on ulnar side of 2-3. Impression Osteoarthritis changes but most involving the 2nd-3rd MCPs and more vertical osteophyte formation is also suggestive for secondary causes  XR Hand 2 View Right  Result Date: 02/12/2021 Xray right hand 2 views Radiocarpal joint appears normal. Fusion of trapezium and trapezoid present, no significant 1st CMC arthritis seen. Marginal  osteophytes or enthesophytes at proximal phalanges. 2nd MCP narrowing, hook osteophyte of 3rd metacarpal head. Some periosteal reaction at proximal phalanges. PIP and DIP joints with mild degenerative changes. Impression No erosions or tophi seen, but arthritis prominent at MCP joints with hook osteophyte formation is suggestive for inflammatory or secondary process   Recent Labs: Lab Results  Component Value Date   WBC 8.9 03/15/2018   HGB 14.7 03/15/2018   PLT 250 03/15/2018   NA 136 09/26/2020   K 4.1 09/26/2020   CL 104 09/26/2020   CO2 19 (L) 09/26/2020   GLUCOSE 139 (H) 09/26/2020   BUN 41 (H) 09/26/2020   CREATININE 2.03 (H)  09/26/2020   BILITOT 0.5 04/16/2020   ALKPHOS 57 04/16/2020   AST 28 04/16/2020   ALT 38 04/16/2020   PROT 7.7 04/16/2020   ALBUMIN 4.6 04/16/2020   CALCIUM 9.4 09/26/2020   GFRAA 42 (L) 04/16/2020    Speciality Comments: No specialty comments available.  Procedures:  No procedures performed Allergies: Meat extract, Lipitor [atorvastatin], and Simvastatin   Assessment / Plan:     Visit Diagnoses: Elevated sedimentation rate - Plan: Sedimentation rate, Rheumatoid factor, Cyclic citrul peptide antibody, IgG, Uric acid, Serum protein electrophoresis with reflex, CK  Joint pain with episodic swelling and high sed rate consistent with inflammatory arthritis. Sounds most like a migratory arthritis. This could reflect chronic gout with risk factor of his CKD stage 3. Checking RF, CCP, uric acid, also SPEP and repeating ESR today on a less symptomatic day. If no specific diagnosis indicated would recommend he return with next flare.  Chronic pain of both shoulders   Shoulder pain seems most consistent with impingement syndrome at this time. No severe deficits in ROM strength or neurologic symptoms.  Bilateral foot pain   Bilateral foot pain there is erythema and swelling may be neuropathy versus inflammatory component. Checking bilateral xrays for  inflammatory or erosive disease changes given chronicity of symptoms and presumed gout contribution.  Bilateral hand pain   No inflammatory changes seen in hands on exam but currently between flares. Checking bilateral hand xrays for degenerative or inflammatory disease changes.  Orders: Orders Placed This Encounter  Procedures  . XR Hand 2 View Right  . XR Hand 2 View Left  . XR Foot 2 Views Right  . XR Foot 2 Views Left  . Sedimentation rate  . Rheumatoid factor  . Cyclic citrul peptide antibody, IgG  . Uric acid  . Serum protein electrophoresis with reflex  . CK   No orders of the defined types were placed in this encounter.    Follow-Up Instructions: Return if symptoms worsen or fail to improve, for Can call back when symptoms increase.   Collier Salina, MD  Note - This record has been created using [employer].  Chart creation errors have been sought, but may not always  have been located. Such creation errors do not reflect on  the standard of medical care.

## 2021-02-11 ENCOUNTER — Ambulatory Visit: Payer: Self-pay

## 2021-02-11 ENCOUNTER — Encounter: Payer: Self-pay | Admitting: Internal Medicine

## 2021-02-11 ENCOUNTER — Ambulatory Visit: Payer: Medicare HMO | Admitting: Internal Medicine

## 2021-02-11 ENCOUNTER — Other Ambulatory Visit: Payer: Self-pay

## 2021-02-11 VITALS — BP 143/82 | HR 72 | Ht 69.0 in | Wt 296.0 lb

## 2021-02-11 DIAGNOSIS — M25511 Pain in right shoulder: Secondary | ICD-10-CM

## 2021-02-11 DIAGNOSIS — G8929 Other chronic pain: Secondary | ICD-10-CM

## 2021-02-11 DIAGNOSIS — M79642 Pain in left hand: Secondary | ICD-10-CM | POA: Insufficient documentation

## 2021-02-11 DIAGNOSIS — M79671 Pain in right foot: Secondary | ICD-10-CM | POA: Diagnosis not present

## 2021-02-11 DIAGNOSIS — M79641 Pain in right hand: Secondary | ICD-10-CM

## 2021-02-11 DIAGNOSIS — R700 Elevated erythrocyte sedimentation rate: Secondary | ICD-10-CM | POA: Diagnosis not present

## 2021-02-11 DIAGNOSIS — M79672 Pain in left foot: Secondary | ICD-10-CM | POA: Diagnosis not present

## 2021-02-11 DIAGNOSIS — M25512 Pain in left shoulder: Secondary | ICD-10-CM | POA: Diagnosis not present

## 2021-02-11 NOTE — Patient Instructions (Addendum)
Erythrocyte Sedimentation Rate Test Why am I having this test? The erythrocyte sedimentation rate (ESR) test is used to help find illnesses related to:  Sudden (acute) or long-term (chronic) infections.  Inflammation.  The body's disease-fighting system attacking healthy cells (autoimmune diseases).  Cancer.  Tissue death. If you have symptoms that may be related to any of these illnesses, your health care provider may do an ESR test before doing more specific tests. If you have an inflammatory immune disease, such as rheumatoid arthritis, you may have this test to help monitor your therapy. What is being tested? This test measures how long it takes for your red blood cells (erythrocytes) to settle in a solution over a certain amount of time (sedimentation rate). When you have an infection or inflammation, your red blood cells clump together and settle faster. The sedimentation rate provides information about how much inflammation is present in the body. What kind of sample is taken? A blood sample is required for this test. It is usually collected by inserting a needle into a blood vessel.   How do I prepare for this test? Follow any instructions from your health care provider about changing or stopping your regular medicines. Tell a health care provider about:  Any allergies you have.  All medicines you are taking, including vitamins, herbs, eye drops, creams, and over-the-counter medicines.  Any blood disorders you have.  Any surgeries you have had.  Any medical conditions you have, such as thyroid or kidney disease.  Whether you are pregnant or may be pregnant. How are the results reported? Your results will be reported as a value that measures sedimentation rate in millimeters per hour (mm/hr). Your health care provider will compare your results to normal ranges that were established after testing a large group of people (reference values). Reference values may vary among  labs and hospitals. For this test, common reference values, which vary by age and gender, are:  Newborn: 0-2 mm/hr.  Child, up to puberty: 0-10 mm/hr.  Male: ? Under 50 years: 0-20 mm/hr. ? 50-85 years: 0-30 mm/hr. ? Over 85 years: 0-42 mm/hr.  Male: ? Under 50 years: 0-15 mm/hr. ? 50-85 years: 0-20 mm/hr. ? Over 85 years: 0-30 mm/hr. Certain conditions or medicines may cause ESR levels to be falsely lower or higher, such as:  Pregnancy.  Obesity.  Steroids, birth control pills, and blood thinners.  Thyroid or kidney disease. What do the results mean? Results that are within reference values are considered normal, meaning that the level of inflammation in your body is healthy. High ESR levels mean that there is inflammation in your body. You will have more tests to help make a diagnosis. Inflammation may result from many different conditions or injuries. Talk with your health care provider about what your results mean. Questions to ask your health care provider Ask your health care provider, or the department that is doing the test:  When will my results be ready?  How will I get my results?  What are my treatment options?  What other tests do I need?  What are my next steps? Summary  The erythrocyte sedimentation rate (ESR) test is used to help find illnesses associated with sudden (acute) or long-term (chronic) infections, inflammation, autoimmune diseases, cancer, or tissue death.  If you have symptoms that may be related to any of these illnesses, your health care provider may do an ESR test before doing more specific tests. If you have an inflammatory immune disease, such as  rheumatoid arthritis, you may have this test to help monitor your therapy.  This test measures how long it takes for your red blood cells (erythrocytes) to settle in a solution over a certain amount of time (sedimentation rate). This provides information about how much inflammation is present  in the body.    Uric Acid Test Why am I having this test? Uric acid is a chemical that gets released when certain substances in the body (purines) break down. Normally, uric acid is filtered out of the blood by the kidneys, and then it leaves the body through urine. If your body makes too much uric acid, or if your kidneys are not removing enough of it, uric acid can start to form crystals that build up in your joints or kidneys. Uric acid crystals in your joints can cause a type of arthritis (gout). Crystals in your urine can form kidney stones. You may have a uric acid test:  If you have joint pain or swelling that may be caused by gout.  If you frequently have kidney stones.  To help diagnose or monitor treatment of gout.  To monitor radiation or chemotherapy treatment.  To monitor kidney function or diagnose kidney disorders. What is being tested? This test measures the amount of uric acid in your blood or urine. What kind of sample is taken? This test may be done with a blood sample or a urine sample.  A blood sample is usually collected by inserting a needle into a blood vessel. You may have a blood sample taken if: ? You are receiving treatment that increases purines in your body, such as radiation or chemotherapy. ? You have joint pain that may be caused by gout.  You may have a urine sample taken if you have kidney stones. You may be asked to collect urine samples at home over a period of 24 hours.   How do I collect samples at home? When collecting a urine sample at home, make sure you:  Use supplies and instructions that you received from the lab.  Collect urine only in the germ-free (sterile) cup that you received from the lab.  Do not let any toilet paper or stool (feces) get into the cup.  Refrigerate the sample until you can return it to the lab.  Return the sample(s) to the lab as instructed. How do I prepare for this test? Follow instructions from your health  care provider about:  Eating or drinking restrictions. You may need to stop eating and drinking everything except water starting 4 hours before the test.  Changing or stopping your regular medicines. Some medicines can affect uric acid levels. Tell a health care provider about:  Recent intense exercise. If you have recently exercised a lot, this may affect your test results.  Any allergies you have.  All medicines you are taking, including vitamins, herbs, eye drops, creams, and over-the-counter medicines.  Any blood disorders you have.  Any surgeries you have had.  Any medical conditions you have.  Whether you are pregnant or may be pregnant. How are the results reported? Your results will be reported as a value that indicates how much uric acid is in your blood or urine. Your health care provider will compare your results to normal ranges that were established after testing a large group of people (reference ranges). Reference ranges may vary among labs and hospitals. For this test, common reference ranges are:  Blood test: ? Adult male: 4.0-8.5 mg/dL or 0.24-0.51 mmol/L. ?  Adult male: 2.7-7.3 mg/dL or 0.16-0.43 mmol/L. ? Child: 2.5-5.5 mg/dL or 0.12-0.32 mmol/L. ? Newborn: 2.0-6.2 mg/dL.  Urine test: 250-750 mg/24 hr or 1.48-4.43 mmol/day (SI units). What do the results mean? Results within your reference range are considered normal, meaning that you have a normal amount of uric acid in your body. Results that are higher than your reference range may mean that:  Your body is making too much uric acid.  Your kidneys are not removing enough uric acid.  Your gout treatment plan needs to be adjusted, if applicable. You may need more tests to determine what is causing high uric acid levels. Possible causes include:  Gout.  Kidney disease.  Cancer or cancer treatment.  A diet high in purines.  Alcohol abuse.  Diabetes (diabetes mellitus). Results that are below your  reference range mean that you have too little uric acid in your body. This is usually caused by certain medicines and is usually not serious. Talk with your health care provider about what your results mean. Questions to ask your health care provider Ask your health care provider, or the department that is doing the test:  When will my results be ready?  How will I get my results?  What are my treatment options?  What other tests do I need?  What are my next steps? Summary  Uric acid is a chemical that gets released when certain substances in the body (purines) break down.  If your body makes too much uric acid, or if your kidneys are not removing enough of it, uric acid can start to form crystals that build up in your joints or kidneys.  Uric acid crystals in your joints can cause a type of arthritis (gout). Crystals in your urine can form kidney stones.  This test measures the amount of uric acid in your blood or urine.    Rheumatoid Factor Test Why am I having this test? The rheumatoid factor test is used to help diagnose certain autoimmune diseases. Normally, your body makes protective proteins called antibodies (IgM, IgG, and IgA) to help fight off infections. If you have an autoimmune disease, your body may make a collection of antibodies that do not function correctly (autoantibodies). They attack tissues that are wrongly identified as foreign. In some autoimmune diseases, these autoantibodies are known as the rheumatoid factor (RF). You may have this test if your health care provider suspects that you have an autoimmune disease, such as:  Rheumatoid arthritis (RA).  Systemic lupus erythematosus (SLE).  Sjgren's syndrome.  Mixed connective tissue disease. A majority of people who have rheumatoid arthritis have a positive rheumatoid factor. Raised levels of these autoantibodies can also sometimes be a sign of other autoimmune diseases. However, it is also possible for the  RF test to be negative even when a disease is present. Likewise, a small number of people may have a positive RF test when an autoimmune disease is not actually present. Other tests may be needed to help make a diagnosis. What is being tested? This test checks your blood for the RF autoantibodies. What kind of sample is taken? A blood sample is required for this test. It is usually collected by inserting a needle into a blood vessel or by sticking a finger with a small needle.   How are the results reported? Your test result will be reported as either positive or negative for RF autoantibodies. What do the results mean? A negative result means that no RF or only a small  amount was found in your blood. This means that it is unlikely that you have an autoimmune disease. A positive result means that a larger amount of RF autoantibodies was found in your blood. This may indicate that you have RA or another autoimmune disease. Your health care provider will talk to you about doing more tests to confirm your results. Talk with your health care provider about what your results mean. Questions to ask your health care provider Ask your health care provider, or the department that is doing the test:  When will my results be ready?  How will I get my results?  What are my treatment options?  What other tests do I need?  What are my next steps? Summary  The rheumatoid factor test is used to help diagnose certain autoimmune diseases.  In some autoimmune diseases, the body makes autoantibodies known as the rheumatoid factor (RF), which attack tissues that are wrongly identified as foreign.  A negative result means that no RF or only a small amount was found in your blood.  A positive result means that a larger amount of RF autoantibodies was found in your blood.  Talk with your health care provider about what your results mean. This information is not intended to replace advice given to you by  your health care provider. Make sure you discuss any questions you have with your health care provider. Document Revised: 02/01/2020 Document Reviewed: 02/01/2020 Elsevier Patient Education  2021 [employer].

## 2021-02-14 ENCOUNTER — Other Ambulatory Visit: Payer: Self-pay | Admitting: *Deleted

## 2021-02-14 DIAGNOSIS — M25511 Pain in right shoulder: Secondary | ICD-10-CM

## 2021-02-14 DIAGNOSIS — M79641 Pain in right hand: Secondary | ICD-10-CM | POA: Diagnosis not present

## 2021-02-14 DIAGNOSIS — M79672 Pain in left foot: Secondary | ICD-10-CM

## 2021-02-14 DIAGNOSIS — G8929 Other chronic pain: Secondary | ICD-10-CM

## 2021-02-14 DIAGNOSIS — M79642 Pain in left hand: Secondary | ICD-10-CM | POA: Diagnosis not present

## 2021-02-14 DIAGNOSIS — R700 Elevated erythrocyte sedimentation rate: Secondary | ICD-10-CM

## 2021-02-14 DIAGNOSIS — M25512 Pain in left shoulder: Secondary | ICD-10-CM | POA: Diagnosis not present

## 2021-02-14 DIAGNOSIS — M79671 Pain in right foot: Secondary | ICD-10-CM | POA: Diagnosis not present

## 2021-02-15 NOTE — Progress Notes (Signed)
Lab results show no sign of muscle damage but there is joint inflammation. His rheumatoid arthritis antibody test is negative but the uric acid is high so gout seems likely to be the cause of symptoms. We can schedule to follow up whenever available to discuss treatment plan.

## 2021-02-19 ENCOUNTER — Other Ambulatory Visit: Payer: Self-pay

## 2021-02-19 ENCOUNTER — Ambulatory Visit (INDEPENDENT_AMBULATORY_CARE_PROVIDER_SITE_OTHER): Payer: Medicare HMO | Admitting: Internal Medicine

## 2021-02-19 ENCOUNTER — Encounter: Payer: Self-pay | Admitting: Internal Medicine

## 2021-02-19 VITALS — BP 149/68 | HR 77 | Ht 70.0 in | Wt 295.6 lb

## 2021-02-19 DIAGNOSIS — R300 Dysuria: Secondary | ICD-10-CM

## 2021-02-19 DIAGNOSIS — M1040 Other secondary gout, unspecified site: Secondary | ICD-10-CM

## 2021-02-19 DIAGNOSIS — M1009 Idiopathic gout, multiple sites: Secondary | ICD-10-CM

## 2021-02-19 DIAGNOSIS — N1832 Chronic kidney disease, stage 3b: Secondary | ICD-10-CM | POA: Diagnosis not present

## 2021-02-19 MED ORDER — ALLOPURINOL 100 MG PO TABS: 100.0000 mg | ORAL_TABLET | Freq: Every day | ORAL | 0 refills | Status: DC

## 2021-02-19 NOTE — Progress Notes (Signed)
Office Visit Note  Patient: Brandon Warren             Date of Birth: 11-06-1950           MRN: [id number]             PCP: Redmond School, MD Referring: Redmond School, MD Visit Date: 02/19/2021   Subjective:  Follow-up (Patient denies changes in symptoms. Patient is concerned with possible UTI- notices urgency, mild leakage, and brain fog/confusion. )   History of Present Illness: Brandon Warren is a 70 y.o. male here for follow up for gout initial visit with labs showing uric acid of 9.9 and high ESR.  Since last visit he denies any significant change in joint symptoms still having foot pain and swelling and stiffness in the knee but is concerned for possible UTI based on increase in urinary urgency mild leakage and an overall sense of brain fog or confusion.  He has a history of prostate hypertrophy with urinary retention but not incontinence.  He denies any pain or blood with urination.     Review of Systems  Constitutional: Negative for fatigue.  HENT: Negative for mouth sores, mouth dryness and nose dryness.   Eyes: Positive for itching. Negative for pain, visual disturbance and dryness.  Respiratory: Positive for shortness of breath. Negative for cough, hemoptysis and difficulty breathing.   Cardiovascular: Positive for irregular heartbeat. Negative for chest pain, palpitations and swelling in legs/feet.  Gastrointestinal: Positive for diarrhea. Negative for abdominal pain, blood in stool and constipation.  Endocrine: Negative for increased urination.  Genitourinary: Negative for painful urination.  Musculoskeletal: Positive for arthralgias, joint pain, joint swelling, myalgias, muscle weakness, morning stiffness, muscle tenderness and myalgias.  Skin: Positive for rash. Negative for color change and redness.  Allergic/Immunologic: Negative for susceptible to infections.  Neurological: Positive for memory loss. Negative for dizziness, numbness, headaches and weakness.   Hematological: Negative for swollen glands.  Psychiatric/Behavioral: Positive for confusion. Negative for sleep disturbance.    PMFS History:  Patient Active Problem List   Diagnosis Date Noted  . 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 Dysuria 02/19/2021  . Elevated sedimentation rate 02/11/2021  . Bilateral shoulder pain 02/11/2021  . Bilateral foot pain 02/11/2021  . Bilateral hand pain 02/11/2021  . CKD (chronic kidney disease) stage 3, GFR 30-59 ml/min (HCC) 04/17/2020  . Diabetes mellitus type 2 in obese ([place]) 05/21/2017  . Hyperprolactinemia ([place]) 05/12/2017  . Chest pain 04/20/2016  . Encounter for screening colonoscopy 01/24/2014  . Pseudophakia, left eye 06/08/2013  . Status post laser cataract surgery of left eye 05/25/2013  . Bradycardia 05/20/2013  . Dizziness 05/20/2013  . Coronary artery disease involving native coronary artery of native heart with angina pectoris ([place]) 05/19/2013  . Essential hypertension, benign 05/19/2013  . Hypertriglyceridemia 05/19/2013  . Hypothyroidism 05/19/2013  . Morbid obesity ([place]) 05/19/2013  . Amblyopia of right eye 05/02/2013  . Combined form of senile cataract 05/02/2013    Past Medical History:  Diagnosis Date  . Arthritis   . Back pain   . BPH (benign prostatic hyperplasia)   . CAD S/P percutaneous coronary angioplasty    a. 2005: PCI OM - Zomax study stent DES  2.5 x16 mm. b. cath in 02/2018 showing 20% ISR of LCx stent and 20% stenosis along mid-distal LM.   Marland Kitchen Controlled type 2 diabetes with neuropathy ([place])   . Edema of both lower extremities   . Food allergy   . Gout   .  Hypercholesterolemia   . Hypertension   . Hypothyroidism   . Kidney disease   . MI (myocardial infarction) ([place])    in his 48s  . Sleep apnea   . SOB (shortness of breath)     Family History  Problem Relation Age of Onset  . Colon polyps Mother   . Hypertension Mother   . High Cholesterol Father   . Heart disease Father   . COPD Father   . Healthy  Other   . Healthy Daughter   . Colon cancer Neg Hx    Past Surgical History:  Procedure Laterality Date  . APPENDECTOMY    . CATARACT EXTRACTION Left   . COLONOSCOPY N/A 02/01/2014   Procedure: COLONOSCOPY;  Surgeon: Daneil Dolin, MD;  Location: [facility];  Service: Endoscopy;  Laterality: N/A;  8:30  . CORONARY ANGIOPLASTY WITH STENT PLACEMENT  2005   PCI- OM1 Zomax study stent 2.5 x16 mm.  Marland Kitchen LEFT HEART CATH AND CORONARY ANGIOGRAPHY N/A 03/15/2018   Procedure: LEFT HEART CATH AND CORONARY ANGIOGRAPHY;  Surgeon: Leonie Man, MD;  Location: [facility];  Service: Cardiovascular;  Laterality: N/A;   Social History   Social History Narrative  . Not on file    There is no immunization history on file for this patient.   Objective: Vital Signs: BP (!) 149/68 (BP Location: Left Arm, Patient Position: Sitting, Cuff Size: Normal)   Pulse 77   Ht $R'5\' 10"'AA$  (1.778 m)   Wt 295 lb 9.6 oz (134.1 kg)   BMI 42.41 kg/m    Physical Exam Constitutional:      Appearance: He is obese.  Neurological:     Mental Status: He is alert.   Psychiatric:        Mood and Affect: Mood normal.   Musculoskeletal Exam:  Wrists full ROM no tenderness or swelling Fingers full ROM no tenderness or swelling Knees full ROM no tenderness or swelling patellofemoral crepitus present  Investigation: No additional findings.  Imaging: XR Foot 2 Views Left  Result Date: 02/12/2021 Xray left foot 2 views Normal tibiotalar joint space and alignment. Small anterior osteophyte and small calcification or loose body present medially. No calcaneal changes or significant midfoot changes. Significant arthritis of the 1st MTP joint with asymmetric MTP narrowing and medial probably erosive changes unless postsurgical or traumatic. No erosive disease of other joints. Impression Mild OA of the foot, damage of 1st MTP suspect previous trauma or crystalline arthropathy  XR Foot 2 Views Right  Result Date:  02/12/2021 Xray right foot 2 views Normal tibiotalar joint space and alignment. Small anterior osteophytes. No calcaneal  Changes. Mild probably degenerative changes on dorsal midfoot joints. MTP and PIP joint spaces appear normal. No erosions are present. Impression Mild to moderate degenerative arthritis changes and mild dorsal soft tissue swelling  XR Hand 2 View Left  Result Date: 02/12/2021 Xray left hand 2 views Radiocarpal joint appears normal. Trapezium and trapezoid appear to have chronic fracture of a previous fusion. MCP joints with osteophytes and probable cystic changes. PIP and DIp joints are mildly affected, with periarticular calcifications vs small old avulsions on ulnar side of 2-3. Impression Osteoarthritis changes but most involving the 2nd-3rd MCPs and more vertical osteophyte formation is also suggestive for secondary causes  XR Hand 2 View Right  Result Date: 02/12/2021 Xray right hand 2 views Radiocarpal joint appears normal. Fusion of trapezium and trapezoid present, no significant 1st CMC arthritis seen. Marginal osteophytes or  enthesophytes at proximal phalanges. 2nd MCP narrowing, hook osteophyte of 3rd metacarpal head. Some periosteal reaction at proximal phalanges. PIP and DIP joints with mild degenerative changes. Impression No erosions or tophi seen, but arthritis prominent at MCP joints with hook osteophyte formation is suggestive for inflammatory or secondary process   Recent Labs: Lab Results  Component Value Date   WBC 8.9 03/15/2018   HGB 14.7 03/15/2018   PLT 250 03/15/2018   NA 136 09/26/2020   K 4.1 09/26/2020   CL 104 09/26/2020   CO2 19 (L) 09/26/2020   GLUCOSE 139 (H) 09/26/2020   BUN 41 (H) 09/26/2020   CREATININE 2.03 (H) 09/26/2020   BILITOT 0.5 04/16/2020   ALKPHOS 57 04/16/2020   AST 28 04/16/2020   ALT 38 04/16/2020   PROT 7.2 02/14/2021   ALBUMIN 4.6 04/16/2020   CALCIUM 9.4 09/26/2020   GFRAA 42 (L) 04/16/2020    Speciality  Comments: No specialty comments available.  Procedures:  No procedures performed Allergies: Meat extract, Lipitor [atorvastatin], and Simvastatin   Assessment / Plan:     Visit Diagnoses: Secondary gout - Plan: allopurinol (ZYLOPRIM) 100 MG tablet  Pain swelling and erythema involving the feet and knees with labs most consistent for gout based on hyperuricemia and high sed rate suspect this is secondary to chronic kidney disease and cardiovascular disease management.  Recommend starting urate lowering therapy for treatment of the now pretty chronic joint inflammation also important for overall risk reduction.  Baseline labs okay will need follow-up in 4 weeks for response to treatment.  Starting at 100 mg will not increment any faster than this based on decreased baseline renal clearance.  Discussed risk and benefits of medication with the monitoring for cytopenias and in particular watching for any rashes.  Stage 3b chronic kidney disease (HCC)  Slow titration up of allopurinol required with impaired renal clearance.  We will monitor closely with repeat labs 4 weeks after each dose increase incrementally.  Dysuria - Plan: Urinalysis with Culture Reflex  Urinary frequency and slight incontinence checking for evidence of UTI.  Could also represent overflow incontinence secondary to the chronic BPH.  If positive needs treatment but probably in either case needs to follow-up with the urology.   Orders: Orders Placed This Encounter  Procedures  . Urinalysis with Culture Reflex   Meds ordered this encounter  Medications  . allopurinol (ZYLOPRIM) 100 MG tablet    Sig: Take 1 tablet (100 mg total) by mouth daily.    Dispense:  30 tablet    Refill:  0     Follow-Up Instructions: Return in about 4 weeks (around 03/19/2021) for Gout new allpourinol start f/u.   Collier Salina, MD  Note - This record has been created using [employer].  Chart creation errors have been sought, but  may not always  have been located. Such creation errors do not reflect on  the standard of medical care.

## 2021-02-19 NOTE — Patient Instructions (Signed)
Allopurinol tablets What is this medicine? ALLOPURINOL (al oh PURE i nole) reduces the amount of uric acid the body makes. It is used to treat the symptoms of gout. It is also used to treat or prevent high uric acid levels that occur as a result of certain types of chemotherapy. This medicine may also help patients who frequently have kidney stones. This medicine may be used for other purposes; ask your health care provider or pharmacist if you have questions. COMMON BRAND NAME(S): Zyloprim What should I tell my health care provider before I take this medicine? They need to know if you have any of these conditions:  kidney disease  liver disease  an unusual or allergic reaction to allopurinol, other medicines, foods, dyes, or preservatives  pregnant or trying to get pregnant  breast feeding How should I use this medicine? Take this medicine by mouth with a glass of water. Follow the directions on the prescription label. If this medicine upsets your stomach, take it with food or milk. Take your doses at regular intervals. Do not take your medicine more often than directed. Talk to your pediatrician regarding the use of this medicine in children. Special care may be needed. While this drug may be prescribed for children as young as 6 years for selected conditions, precautions do apply. Overdosage: If you think you have taken too much of this medicine contact a poison control center or emergency room at once. NOTE: This medicine is only for you. Do not share this medicine with others. What if I miss a dose? If you miss a dose, take it as soon as you can. If it is almost time for your next dose, take only that dose. Do not take double or extra doses. What may interact with this medicine? Do not take this medicine with the following medication:  didanosine, ddI This medicine may also interact with the following medications:  certain antibiotics like amoxicillin, ampicillin  certain  medicines for cancer  certain medicines for immunosuppression like azathioprine, cyclosporine, mercaptopurine  chlorpropamide  probenecid  thiazide diuretics, like hydrochlorothiazide  sulfinpyrazone  warfarin This list may not describe all possible interactions. Give your health care provider a list of all the medicines, herbs, non-prescription drugs, or dietary supplements you use. Also tell them if you smoke, drink alcohol, or use illegal drugs. Some items may interact with your medicine. What should I watch for while using this medicine? Visit your doctor or healthcare provider for regular checks on your progress. If you are taking this medicine to treat gout, you may not have less frequent attacks at first. Keep taking your medicine regularly and the attacks should get better within 2 to 6 weeks. Drink plenty of water (10 to 12 full glasses a day) while you are taking this medicine. This will help to reduce stomach upset and reduce the risk of getting gout or kidney stones. Call your doctor or healthcare provider at once if you get a skin rash together with chills, fever, sore throat, or nausea and vomiting, if you have blood in your urine, or difficulty passing urine. This medicine may cause serious skin reactions. They can happen weeks to months after starting the medicine. Contact your healthcare provider right away if you notice fevers or flu-like symptoms with a rash. The rash may be red or purple and then turn into blisters or peeling of the skin. Or, you might notice a red rash with swelling of the face, lips or lymph nodes in your neck  or under your arms. Do not take vitamin C without asking your doctor or healthcare provider. Too much vitamin C can increase the chance of getting kidney stones. You may get drowsy or dizzy. Do not drive, use machinery, or do anything that needs mental alertness until you know how this drug affects you. Do not stand or sit up quickly, especially if you  are an older patient. This reduces the risk of dizzy or fainting spells. Alcohol can make you more drowsy and dizzy. Alcohol can also increase the chance of stomach problems and increase the amount of uric acid in your blood. Avoid alcoholic drinks. What side effects may I notice from receiving this medicine? Side effects that you should report to your doctor or health care professional as soon as possible:  allergic reactions like skin rash, itching or hives, swelling of the face, lips, or tongue  breathing problems  joint pain  muscle pain  rash, fever, and swollen lymph nodes  redness, blistering, peeling, or loosening of the skin, including inside the mouth  signs and symptoms of infection like fever or chills; cough; sore throat  signs and symptoms of kidney injury like trouble passing urine or change in the amount of urine, flank pain  tingling, numbness in the hands or feet  unusual bleeding or bruising  unusually weak or tired Side effects that usually do not require medical attention (report to your doctor or health care professional if they continue or are bothersome):  changes in taste  diarrhea  drowsiness  headache  nausea, vomiting  stomach upset This list may not describe all possible side effects. Call your doctor for medical advice about side effects. You may report side effects to FDA at 1-800-FDA-1088. Where should I keep my medicine? Keep out of the reach of children. Store at room temperature between 15 and 25 degrees C (59 and 77 degrees F). Protect from light and moisture. Throw away any unused medicine after the expiration date. NOTE: This sheet is a summary. It may not cover all possible information. If you have questions about this medicine, talk to your doctor, pharmacist, or health care provider.  2021 Elsevier/Gold Standard (2019-01-04 09:41:46)

## 2021-02-20 LAB — PROTEIN ELECTROPHORESIS, SERUM, WITH REFLEX
Albumin ELP: 3.8 g/dL (ref 3.8–4.8)
Alpha 1: 0.3 g/dL (ref 0.2–0.3)
Alpha 2: 1.2 g/dL — ABNORMAL HIGH (ref 0.5–0.9)
Beta 2: 0.6 g/dL — ABNORMAL HIGH (ref 0.2–0.5)
Beta Globulin: 0.6 g/dL (ref 0.4–0.6)
Gamma Globulin: 0.7 g/dL — ABNORMAL LOW (ref 0.8–1.7)
Total Protein: 7.2 g/dL (ref 6.1–8.1)

## 2021-02-20 LAB — SEDIMENTATION RATE: Sed Rate: 62 mm/h — ABNORMAL HIGH (ref 0–20)

## 2021-02-20 LAB — RHEUMATOID FACTOR: Rheumatoid fact SerPl-aCnc: <14 IU/mL (ref <14)

## 2021-02-20 LAB — IFE INTERPRETATION: Immunofix Electr Int: NOT DETECTED

## 2021-02-20 LAB — CYCLIC CITRUL PEPTIDE ANTIBODY, IGG: Cyclic Citrullin Peptide Ab: <16 UNITS

## 2021-02-20 LAB — URIC ACID: Uric Acid, Serum: 9.9 mg/dL — ABNORMAL HIGH (ref 4.0–8.0)

## 2021-02-20 LAB — CK: Total CK: 145 U/L (ref 44–196)

## 2021-03-04 ENCOUNTER — Encounter: Payer: Self-pay | Admitting: Internal Medicine

## 2021-03-04 NOTE — Telephone Encounter (Signed)
I recommend he discontinue the allopurinol at this time. If the skin rash is related it should improve after stopping the medicine. If the rash continues more than a week or two after stopping the medicine without improvement, it is unlikely to be related. Skin rashes related to allopurinol can in rare cases become an extremely severe condition, so if the rash does appear related we will need to change to using something different for the gout.

## 2021-03-07 ENCOUNTER — Ambulatory Visit: Payer: Medicare HMO | Admitting: Internal Medicine

## 2021-03-18 ENCOUNTER — Telehealth: Payer: Self-pay | Admitting: Radiology

## 2021-03-18 NOTE — Telephone Encounter (Signed)
Spoke with patient, after beginning Allopurinol patient developed a rash, it will be two weeks tomorrow since patient first noticed rash. The rash seems to have improved but is still not 100% better. Patient has been treating with Benadryl, Pepcid, and topical creams. Since rash is not resolved should patient be concerned this could be caused by something else?

## 2021-03-19 ENCOUNTER — Ambulatory Visit: Payer: Medicare HMO | Admitting: Internal Medicine

## 2021-03-20 MED ORDER — FEBUXOSTAT 40 MG PO TABS: 40.0000 mg | ORAL_TABLET | Freq: Every day | ORAL | 1 refills | Status: DC

## 2021-03-20 NOTE — Telephone Encounter (Signed)
I spoke with Brandon Warren he reports skin rashes are about 80% better at this time. This sounds suspicious for drug related sensitivity and due to allopurinol risk for very severe hypersensitivity reaction recommend we try alternative treatment. Recommend starting febuxostat and discussed the medication, has worse risk for cardiovascular than allopurinol in gout patients but does not share this rash risk. Will send Rx for starting dose today. He will need clinic visit scheduled 4 wks after starting medication.

## 2021-03-20 NOTE — Telephone Encounter (Signed)
Spoke with patient, scheduled follow-up visit: 04/17/2021.

## 2021-03-28 ENCOUNTER — Other Ambulatory Visit: Payer: Self-pay | Admitting: "Endocrinology

## 2021-03-28 DIAGNOSIS — E1169 Type 2 diabetes mellitus with other specified complication: Secondary | ICD-10-CM

## 2021-03-28 DIAGNOSIS — E669 Obesity, unspecified: Secondary | ICD-10-CM

## 2021-04-11 DIAGNOSIS — M1A00X0 Idiopathic chronic gout, unspecified site, without tophus (tophi): Secondary | ICD-10-CM | POA: Diagnosis not present

## 2021-04-11 DIAGNOSIS — E1129 Type 2 diabetes mellitus with other diabetic kidney complication: Secondary | ICD-10-CM | POA: Diagnosis not present

## 2021-04-11 DIAGNOSIS — Z1331 Encounter for screening for depression: Secondary | ICD-10-CM | POA: Diagnosis not present

## 2021-04-11 DIAGNOSIS — Z6836 Body mass index (BMI) 36.0-36.9, adult: Secondary | ICD-10-CM | POA: Diagnosis not present

## 2021-04-11 DIAGNOSIS — Z1389 Encounter for screening for other disorder: Secondary | ICD-10-CM | POA: Diagnosis not present

## 2021-04-11 DIAGNOSIS — E063 Autoimmune thyroiditis: Secondary | ICD-10-CM | POA: Diagnosis not present

## 2021-04-11 DIAGNOSIS — Z0000 Encounter for general adult medical examination without abnormal findings: Secondary | ICD-10-CM | POA: Diagnosis not present

## 2021-04-11 DIAGNOSIS — E782 Mixed hyperlipidemia: Secondary | ICD-10-CM | POA: Diagnosis not present

## 2021-04-11 DIAGNOSIS — E6609 Other obesity due to excess calories: Secondary | ICD-10-CM | POA: Diagnosis not present

## 2021-04-11 DIAGNOSIS — I10 Essential (primary) hypertension: Secondary | ICD-10-CM | POA: Diagnosis not present

## 2021-04-15 ENCOUNTER — Telehealth: Payer: Self-pay | Admitting: Internal Medicine

## 2021-04-15 NOTE — Telephone Encounter (Signed)
I'm sorry to hear that new, I hope everything is turning out okay for her. We can get labs checked locally that would be the main reason for his current visit. Whatever local lab is okay is there [facility] nearby? I can reorder the uloric, although might need dose changed after labs are checked.

## 2021-04-15 NOTE — Telephone Encounter (Signed)
Patient's wife going to have to have surgery this week for a perforated bowel. Patient had to postpone follow up appointment. Patient thinks doctor wanted to get updated lab work on him this week for gout. Patient can do labs in [place] if needed. Patient also thinks he will need a refill on new medication doctor gave him to replace Allopurinol before his return office visit. Please call to advise.

## 2021-04-15 NOTE — Telephone Encounter (Signed)
Spoke with patient, advised we're sorry to hear that news and hope everything is turning out okay for her. We can get labs checked locally that would be the main reason for his current visit. Whatever local lab is okay is there [facility] nearby? Dr. Benjamine Mola can reorder theuloric, although might need dose changed after labs are checked.   Patient is going to contact us either via phone call or MyChart the day before going for labs to let us know location.

## 2021-04-16 ENCOUNTER — Ambulatory Visit: Payer: Medicare HMO | Admitting: Cardiology

## 2021-04-17 ENCOUNTER — Ambulatory Visit: Payer: Medicare HMO | Admitting: Internal Medicine

## 2021-04-23 ENCOUNTER — Other Ambulatory Visit: Payer: Self-pay | Admitting: Radiology

## 2021-04-23 ENCOUNTER — Encounter: Payer: Self-pay | Admitting: Internal Medicine

## 2021-04-23 DIAGNOSIS — N1832 Chronic kidney disease, stage 3b: Secondary | ICD-10-CM

## 2021-04-23 DIAGNOSIS — M1040 Other secondary gout, unspecified site: Secondary | ICD-10-CM

## 2021-04-23 NOTE — Telephone Encounter (Signed)
Recommend uric acid and complete metabolic panel be checked

## 2021-04-25 DIAGNOSIS — E063 Autoimmune thyroiditis: Secondary | ICD-10-CM | POA: Diagnosis not present

## 2021-04-25 DIAGNOSIS — I10 Essential (primary) hypertension: Secondary | ICD-10-CM | POA: Diagnosis not present

## 2021-04-25 DIAGNOSIS — E119 Type 2 diabetes mellitus without complications: Secondary | ICD-10-CM | POA: Diagnosis not present

## 2021-04-25 DIAGNOSIS — E7849 Other hyperlipidemia: Secondary | ICD-10-CM | POA: Diagnosis not present

## 2021-04-25 DIAGNOSIS — I2510 Atherosclerotic heart disease of native coronary artery without angina pectoris: Secondary | ICD-10-CM | POA: Diagnosis not present

## 2021-04-26 ENCOUNTER — Other Ambulatory Visit: Payer: Self-pay | Admitting: Radiology

## 2021-04-26 DIAGNOSIS — M1040 Other secondary gout, unspecified site: Secondary | ICD-10-CM

## 2021-04-26 DIAGNOSIS — N1832 Chronic kidney disease, stage 3b: Secondary | ICD-10-CM

## 2021-04-30 DIAGNOSIS — M25519 Pain in unspecified shoulder: Secondary | ICD-10-CM | POA: Diagnosis not present

## 2021-04-30 DIAGNOSIS — R300 Dysuria: Secondary | ICD-10-CM | POA: Diagnosis not present

## 2021-04-30 DIAGNOSIS — M1040 Other secondary gout, unspecified site: Secondary | ICD-10-CM | POA: Diagnosis not present

## 2021-04-30 DIAGNOSIS — D649 Anemia, unspecified: Secondary | ICD-10-CM | POA: Diagnosis not present

## 2021-04-30 DIAGNOSIS — R700 Elevated erythrocyte sedimentation rate: Secondary | ICD-10-CM | POA: Diagnosis not present

## 2021-04-30 DIAGNOSIS — N1832 Chronic kidney disease, stage 3b: Secondary | ICD-10-CM | POA: Diagnosis not present

## 2021-04-30 DIAGNOSIS — E538 Deficiency of other specified B group vitamins: Secondary | ICD-10-CM | POA: Diagnosis not present

## 2021-05-01 ENCOUNTER — Ambulatory Visit: Payer: Medicare HMO | Admitting: Internal Medicine

## 2021-05-01 ENCOUNTER — Other Ambulatory Visit (HOSPITAL_COMMUNITY): Payer: Self-pay | Admitting: Nephrology

## 2021-05-01 DIAGNOSIS — N189 Chronic kidney disease, unspecified: Secondary | ICD-10-CM | POA: Diagnosis not present

## 2021-05-01 DIAGNOSIS — D508 Other iron deficiency anemias: Secondary | ICD-10-CM | POA: Diagnosis not present

## 2021-05-01 DIAGNOSIS — E872 Acidosis: Secondary | ICD-10-CM | POA: Diagnosis not present

## 2021-05-01 DIAGNOSIS — N170 Acute kidney failure with tubular necrosis: Secondary | ICD-10-CM | POA: Diagnosis not present

## 2021-05-01 DIAGNOSIS — D638 Anemia in other chronic diseases classified elsewhere: Secondary | ICD-10-CM | POA: Diagnosis not present

## 2021-05-01 DIAGNOSIS — E1122 Type 2 diabetes mellitus with diabetic chronic kidney disease: Secondary | ICD-10-CM

## 2021-05-01 DIAGNOSIS — N2581 Secondary hyperparathyroidism of renal origin: Secondary | ICD-10-CM | POA: Diagnosis not present

## 2021-05-01 DIAGNOSIS — E559 Vitamin D deficiency, unspecified: Secondary | ICD-10-CM | POA: Diagnosis not present

## 2021-05-01 DIAGNOSIS — D472 Monoclonal gammopathy: Secondary | ICD-10-CM | POA: Diagnosis not present

## 2021-05-01 DIAGNOSIS — R809 Proteinuria, unspecified: Secondary | ICD-10-CM | POA: Diagnosis not present

## 2021-05-01 DIAGNOSIS — E6609 Other obesity due to excess calories: Secondary | ICD-10-CM | POA: Diagnosis not present

## 2021-05-01 LAB — CMP14+EGFR
ALT: 31 IU/L (ref 0–44)
AST: 25 IU/L (ref 0–40)
Albumin/Globulin Ratio: 1.5 (ref 1.2–2.2)
Albumin: 4.4 g/dL (ref 3.7–4.7)
Alkaline Phosphatase: 60 IU/L (ref 44–121)
BUN/Creatinine Ratio: 15 (ref 10–24)
BUN: 30 mg/dL — ABNORMAL HIGH (ref 8–27)
Bilirubin Total: 0.3 mg/dL (ref 0.0–1.2)
CO2: 18 mmol/L — ABNORMAL LOW (ref 20–29)
Calcium: 9.6 mg/dL (ref 8.6–10.2)
Chloride: 101 mmol/L (ref 96–106)
Creatinine, Ser: 1.96 mg/dL — ABNORMAL HIGH (ref 0.76–1.27)
Globulin, Total: 3 g/dL (ref 1.5–4.5)
Glucose: 124 mg/dL — ABNORMAL HIGH (ref 65–99)
Potassium: 4.6 mmol/L (ref 3.5–5.2)
Sodium: 138 mmol/L (ref 134–144)
Total Protein: 7.4 g/dL (ref 6.0–8.5)
eGFR: 36 mL/min/{1.73_m2} — ABNORMAL LOW (ref >59)

## 2021-05-01 LAB — URIC ACID: Uric Acid: 6.1 mg/dL (ref 3.8–8.4)

## 2021-05-13 ENCOUNTER — Other Ambulatory Visit: Payer: Self-pay

## 2021-05-13 ENCOUNTER — Ambulatory Visit (HOSPITAL_COMMUNITY)
Admission: RE | Admit: 2021-05-13 | Discharge: 2021-05-13 | Disposition: A | Discharge status: Home / Self Care | Payer: Medicare HMO | Source: Ambulatory Visit | Attending: Nephrology | Admitting: Nephrology

## 2021-05-13 DIAGNOSIS — E1122 Type 2 diabetes mellitus with diabetic chronic kidney disease: Secondary | ICD-10-CM | POA: Insufficient documentation

## 2021-05-13 DIAGNOSIS — N179 Acute kidney failure, unspecified: Secondary | ICD-10-CM | POA: Diagnosis not present

## 2021-05-13 DIAGNOSIS — N170 Acute kidney failure with tubular necrosis: Secondary | ICD-10-CM | POA: Diagnosis not present

## 2021-05-13 DIAGNOSIS — N281 Cyst of kidney, acquired: Secondary | ICD-10-CM | POA: Diagnosis not present

## 2021-05-15 DIAGNOSIS — E1122 Type 2 diabetes mellitus with diabetic chronic kidney disease: Secondary | ICD-10-CM | POA: Diagnosis not present

## 2021-05-15 DIAGNOSIS — E559 Vitamin D deficiency, unspecified: Secondary | ICD-10-CM | POA: Diagnosis not present

## 2021-05-15 DIAGNOSIS — E1129 Type 2 diabetes mellitus with other diabetic kidney complication: Secondary | ICD-10-CM | POA: Diagnosis not present

## 2021-05-15 DIAGNOSIS — R809 Proteinuria, unspecified: Secondary | ICD-10-CM | POA: Diagnosis not present

## 2021-05-15 DIAGNOSIS — N189 Chronic kidney disease, unspecified: Secondary | ICD-10-CM | POA: Diagnosis not present

## 2021-05-15 DIAGNOSIS — N170 Acute kidney failure with tubular necrosis: Secondary | ICD-10-CM | POA: Diagnosis not present

## 2021-05-15 DIAGNOSIS — D638 Anemia in other chronic diseases classified elsewhere: Secondary | ICD-10-CM | POA: Diagnosis not present

## 2021-05-15 DIAGNOSIS — D472 Monoclonal gammopathy: Secondary | ICD-10-CM | POA: Diagnosis not present

## 2021-05-15 DIAGNOSIS — E872 Acidosis: Secondary | ICD-10-CM | POA: Diagnosis not present

## 2021-05-16 DIAGNOSIS — G720 Drug-induced myopathy: Secondary | ICD-10-CM | POA: Diagnosis not present

## 2021-05-21 ENCOUNTER — Other Ambulatory Visit: Payer: Self-pay | Admitting: Internal Medicine

## 2021-05-26 DIAGNOSIS — I2510 Atherosclerotic heart disease of native coronary artery without angina pectoris: Secondary | ICD-10-CM | POA: Diagnosis not present

## 2021-05-26 DIAGNOSIS — I10 Essential (primary) hypertension: Secondary | ICD-10-CM | POA: Diagnosis not present

## 2021-05-26 DIAGNOSIS — E119 Type 2 diabetes mellitus without complications: Secondary | ICD-10-CM | POA: Diagnosis not present

## 2021-05-26 DIAGNOSIS — E7849 Other hyperlipidemia: Secondary | ICD-10-CM | POA: Diagnosis not present

## 2021-05-26 DIAGNOSIS — E063 Autoimmune thyroiditis: Secondary | ICD-10-CM | POA: Diagnosis not present

## 2021-05-30 ENCOUNTER — Other Ambulatory Visit (HOSPITAL_COMMUNITY): Payer: Self-pay | Admitting: Nephrology

## 2021-05-30 DIAGNOSIS — I5032 Chronic diastolic (congestive) heart failure: Secondary | ICD-10-CM | POA: Diagnosis not present

## 2021-05-30 DIAGNOSIS — R808 Other proteinuria: Secondary | ICD-10-CM | POA: Diagnosis not present

## 2021-05-30 DIAGNOSIS — E872 Acidosis: Secondary | ICD-10-CM | POA: Diagnosis not present

## 2021-05-30 DIAGNOSIS — I129 Hypertensive chronic kidney disease with stage 1 through stage 4 chronic kidney disease, or unspecified chronic kidney disease: Secondary | ICD-10-CM | POA: Diagnosis not present

## 2021-05-30 DIAGNOSIS — N2881 Hypertrophy of kidney: Secondary | ICD-10-CM | POA: Diagnosis not present

## 2021-05-30 DIAGNOSIS — D638 Anemia in other chronic diseases classified elsewhere: Secondary | ICD-10-CM

## 2021-05-30 DIAGNOSIS — R809 Proteinuria, unspecified: Secondary | ICD-10-CM

## 2021-05-30 DIAGNOSIS — N189 Chronic kidney disease, unspecified: Secondary | ICD-10-CM | POA: Diagnosis not present

## 2021-05-30 DIAGNOSIS — E1129 Type 2 diabetes mellitus with other diabetic kidney complication: Secondary | ICD-10-CM

## 2021-05-30 DIAGNOSIS — E1122 Type 2 diabetes mellitus with diabetic chronic kidney disease: Secondary | ICD-10-CM

## 2021-05-30 DIAGNOSIS — E559 Vitamin D deficiency, unspecified: Secondary | ICD-10-CM | POA: Diagnosis not present

## 2021-06-06 DIAGNOSIS — G4733 Obstructive sleep apnea (adult) (pediatric): Secondary | ICD-10-CM | POA: Diagnosis not present

## 2021-06-07 ENCOUNTER — Encounter (HOSPITAL_COMMUNITY): Payer: Self-pay

## 2021-06-07 ENCOUNTER — Other Ambulatory Visit: Payer: Self-pay

## 2021-06-07 ENCOUNTER — Ambulatory Visit (HOSPITAL_COMMUNITY)
Admission: RE | Admit: 2021-06-07 | Discharge: 2021-06-07 | Disposition: A | Discharge status: Home / Self Care | Payer: Medicare HMO | Source: Ambulatory Visit | Attending: Nephrology | Admitting: Nephrology

## 2021-06-07 DIAGNOSIS — E1122 Type 2 diabetes mellitus with diabetic chronic kidney disease: Secondary | ICD-10-CM | POA: Insufficient documentation

## 2021-06-07 DIAGNOSIS — Z7984 Long term (current) use of oral hypoglycemic drugs: Secondary | ICD-10-CM | POA: Insufficient documentation

## 2021-06-07 DIAGNOSIS — Z8249 Family history of ischemic heart disease and other diseases of the circulatory system: Secondary | ICD-10-CM | POA: Insufficient documentation

## 2021-06-07 DIAGNOSIS — Z87891 Personal history of nicotine dependence: Secondary | ICD-10-CM | POA: Insufficient documentation

## 2021-06-07 DIAGNOSIS — Z79890 Hormone replacement therapy (postmenopausal): Secondary | ICD-10-CM | POA: Insufficient documentation

## 2021-06-07 DIAGNOSIS — D631 Anemia in chronic kidney disease: Secondary | ICD-10-CM | POA: Diagnosis not present

## 2021-06-07 DIAGNOSIS — Z79899 Other long term (current) drug therapy: Secondary | ICD-10-CM | POA: Insufficient documentation

## 2021-06-07 DIAGNOSIS — N2881 Hypertrophy of kidney: Secondary | ICD-10-CM | POA: Insufficient documentation

## 2021-06-07 DIAGNOSIS — E1129 Type 2 diabetes mellitus with other diabetic kidney complication: Secondary | ICD-10-CM | POA: Insufficient documentation

## 2021-06-07 DIAGNOSIS — I130 Hypertensive heart and chronic kidney disease with heart failure and stage 1 through stage 4 chronic kidney disease, or unspecified chronic kidney disease: Secondary | ICD-10-CM | POA: Insufficient documentation

## 2021-06-07 DIAGNOSIS — R809 Proteinuria, unspecified: Secondary | ICD-10-CM | POA: Insufficient documentation

## 2021-06-07 DIAGNOSIS — I5032 Chronic diastolic (congestive) heart failure: Secondary | ICD-10-CM | POA: Insufficient documentation

## 2021-06-07 DIAGNOSIS — N289 Disorder of kidney and ureter, unspecified: Secondary | ICD-10-CM | POA: Diagnosis not present

## 2021-06-07 DIAGNOSIS — E559 Vitamin D deficiency, unspecified: Secondary | ICD-10-CM | POA: Diagnosis not present

## 2021-06-07 DIAGNOSIS — D638 Anemia in other chronic diseases classified elsewhere: Secondary | ICD-10-CM

## 2021-06-07 DIAGNOSIS — E872 Acidosis: Secondary | ICD-10-CM | POA: Insufficient documentation

## 2021-06-07 DIAGNOSIS — R808 Other proteinuria: Secondary | ICD-10-CM | POA: Insufficient documentation

## 2021-06-07 DIAGNOSIS — N189 Chronic kidney disease, unspecified: Secondary | ICD-10-CM | POA: Insufficient documentation

## 2021-06-07 LAB — CBC
HCT: 41.8 % (ref 39.0–52.0)
Hemoglobin: 13.4 g/dL (ref 13.0–17.0)
MCH: 28 pg (ref 26.0–34.0)
MCHC: 32.1 g/dL (ref 30.0–36.0)
MCV: 87.4 fL (ref 80.0–100.0)
Platelets: 304 10*3/uL (ref 150–400)
RBC: 4.78 MIL/uL (ref 4.22–5.81)
RDW: 14.4 % (ref 11.5–15.5)
WBC: 12.8 10*3/uL — ABNORMAL HIGH (ref 4.0–10.5)
nRBC: 0 % (ref 0.0–0.2)

## 2021-06-07 LAB — GLUCOSE, CAPILLARY
Glucose-Capillary: 153 mg/dL — ABNORMAL HIGH (ref 70–99)
Glucose-Capillary: 154 mg/dL — ABNORMAL HIGH (ref 70–99)

## 2021-06-07 LAB — PROTIME-INR
INR: 1 (ref 0.8–1.2)
Prothrombin Time: 13.2 seconds (ref 11.4–15.2)

## 2021-06-07 MED ORDER — FENTANYL CITRATE (PF) 100 MCG/2ML IJ SOLN
INTRAMUSCULAR | Status: AC
  Filled 2021-06-07: qty 2

## 2021-06-07 MED ORDER — MIDAZOLAM HCL 2 MG/2ML IJ SOLN
INTRAMUSCULAR | Status: AC | PRN
  Administered 2021-06-07: 0.5 mg via INTRAVENOUS
  Administered 2021-06-07: 1 mg via INTRAVENOUS

## 2021-06-07 MED ORDER — MIDAZOLAM HCL 2 MG/2ML IJ SOLN
INTRAMUSCULAR | Status: AC
  Filled 2021-06-07: qty 2

## 2021-06-07 MED ORDER — LIDOCAINE HCL (PF) 1 % IJ SOLN
INTRAMUSCULAR | Status: AC
  Filled 2021-06-07: qty 30

## 2021-06-07 MED ORDER — HYDROCODONE-ACETAMINOPHEN 5-325 MG PO TABS: 1.0000 | ORAL_TABLET | ORAL | Status: DC | PRN

## 2021-06-07 MED ORDER — FENTANYL CITRATE (PF) 100 MCG/2ML IJ SOLN
INTRAMUSCULAR | Status: AC | PRN
  Administered 2021-06-07 (×2): 25 ug via INTRAVENOUS

## 2021-06-07 NOTE — H&P (Signed)
Chief Complaint: Patient was seen in consultation today for renal biopsy at the request of [place] S  Referring Physician(s): [place] S  Supervising Physician: Arne Cleveland  Patient Status: [facility]  History of Present Illness: Brandon Warren is a 70 y.o. male being worked up for CKD and hyperphosphatemia. He is referred for image guided random renal biopsy. PMHx, meds, labs, imaging, allergies reviewed. Feels well, no recent fevers, chills, illness. A little anxious about the procedure. Has been NPO today as directed.   Past Medical History:  Diagnosis Date   Arthritis    Back pain    BPH (benign prostatic hyperplasia)    CAD S/P percutaneous coronary angioplasty    a. 2005: PCI OM - Zomax study stent DES  2.5 x16 mm. b. cath in 02/2018 showing 20% ISR of LCx stent and 20% stenosis along mid-distal LM.    Controlled type 2 diabetes with neuropathy (HCC)    Edema of both lower extremities    Food allergy    Gout    Hypercholesterolemia    Hypertension    Hypothyroidism    Kidney disease    MI (myocardial infarction) ([place])    in his 75s   Sleep apnea    SOB (shortness of breath)     Past Surgical History:  Procedure Laterality Date   APPENDECTOMY     CATARACT EXTRACTION Left    COLONOSCOPY N/A 02/01/2014   Procedure: COLONOSCOPY;  Surgeon: Daneil Dolin, MD;  Location: [facility];  Service: Endoscopy;  Laterality: N/A;  8:30   CORONARY ANGIOPLASTY WITH STENT PLACEMENT  2005   PCI- OM1 Zomax study stent 2.5 x16 mm.   LEFT HEART CATH AND CORONARY ANGIOGRAPHY N/A 03/15/2018   Procedure: LEFT HEART CATH AND CORONARY ANGIOGRAPHY;  Surgeon: Leonie Man, MD;  Location: [facility];  Service: Cardiovascular;  Laterality: N/A;    Allergies: Meat extract, Lipitor [atorvastatin], Simvastatin, and Allopurinol  Medications: Prior to Admission medications   Medication Sig Start Date End Date Taking? Authorizing Provider   acetaminophen (TYLENOL) 500 MG tablet Take 500 mg by mouth 2 (two) times daily as needed (for pain.).   Yes [provider]  amLODipine (NORVASC) 5 MG tablet Take 1 tablet (5 mg total) by mouth in the morning and at bedtime. 12/31/20  Yes Branch, Alphonse Guild, MD  carvedilol (COREG) 25 MG tablet Take 1.5 tablets (37.5 mg total) by mouth 2 (two) times daily. 09/28/20 06/05/21 Yes Branch, Alphonse Guild, MD  EPINEPHrine (EPIPEN 2-PAK) 0.3 mg/0.3 mL IJ SOAJ injection Inject 0.3 mg into the muscle as needed for anaphylaxis.   Yes [provider]  ezetimibe (ZETIA) 10 MG tablet Take 10 mg by mouth at bedtime.   Yes [provider]  ferrous sulfate 325 (65 FE) MG tablet Take 325 mg by mouth 3 (three) times daily.   Yes [provider]  finasteride (PROSCAR) 5 MG tablet Take 5 mg by mouth daily.    Yes [provider]  furosemide (LASIX) 20 MG tablet Take 20 mg daily if you have more than 3 lbs weight gain in 24 hrs Patient taking differently: Take 10 mg by mouth 2 (two) times daily. 04/13/18  Yes Strader, [place] M, PA-C  halobetasol (ULTRAVATE) 0.05 % cream Apply 1 application topically 3 (three) times daily as needed for rash. 04/11/21  Yes [provider]  hydrALAZINE (APRESOLINE) 100 MG tablet Take 1 tablet (100 mg total) by mouth 3 (three) times  daily. 12/18/20  Yes Branch, Alphonse Guild, MD  HYDROcodone-acetaminophen [facility]) 10-325 MG tablet Take 1-2 tablets by mouth every 6 (six) hours as needed for moderate pain.   Yes [provider]  ibuprofen (ADVIL,MOTRIN) 200 MG tablet Take 400 mg by mouth 2 (two) times daily as needed (for pain.).   Yes [provider]  levothyroxine (SYNTHROID) 25 MCG tablet TAKE ONE TABLET (25MCG TOTAL) BY MOUTH DAILY BEFORE BREAKFAST. TAKE ALONG WITH 200MCG FOR TOTAL DOSE OF 225MCG. Patient taking differently: Take 25 mcg by mouth daily before breakfast. Take with 200 mcg to equal 225 mcg daily 02/16/19  Yes Nida,  Marella Chimes, MD  levothyroxine (SYNTHROID, LEVOTHROID) 200 MCG tablet TAKE ONE TABLET BY MOUTH EVERY DAY BEFORE BREAKFAST Patient taking differently: Take 200 mcg by mouth daily before breakfast. Take with 25 mcg to equal 225 mcg daily 01/19/19  Yes Nida, Marella Chimes, MD  losartan (COZAAR) 100 MG tablet TAKE ONE TABLET (100MG  TOTAL) BY MOUTH DAILY 05/02/19  Yes Nida, Marella Chimes, MD  metFORMIN (GLUCOPHAGE) 500 MG tablet Take 1 tablet (500 mg total) by mouth 2 (two) times daily with breakfast and lunch. 05/31/20  Yes Brown, Angel A, DO  OVER THE COUNTER MEDICATION Apply 1 application topically daily as needed (rash). Terrasil cream   Yes [provider]  OVER THE COUNTER MEDICATION Apply 1 application topically daily as needed (itching). E45 itch relief cream   Yes [provider]  tamsulosin (FLOMAX) 0.4 MG CAPS capsule TAKE ONE (1) CAPSULE BY MOUTH TWICE A DAY. (EVERY 12 HOURS.) 11/14/20  Yes McKenzie, Candee Furbish, MD  TRULICITY 3 FO/2.7XA SOPN Inject 3 mg as directed every Wednesday. 07/03/20  Yes [provider]  ALPRAZolam Duanne Moron) 0.5 MG tablet Take 0.5 mg by mouth daily as needed for anxiety.    [provider]  aspirin EC 81 MG tablet Take 81 mg by mouth every evening.    [provider]  Cholecalciferol (VITAMIN D3) 5000 units CAPS Take 1 capsule (5,000 Units total) by mouth daily. 09/30/17   Cassandria Anger, MD  diazepam (VALIUM) 10 MG tablet Take 10 mg by mouth daily as needed (muscle spasms).    [provider]  febuxostat (ULORIC) 40 MG tablet TAKE ONE TABLET (40MG ) BY MOUTH DAILY 05/21/21   Rice, Resa Miner, MD  MITIGARE 0.6 MG CAPS Take 0.6 mg by mouth 2 (two) times daily as needed (gout flare). 11/29/20   [provider]  omega-3 acid ethyl esters (LOVAZA) 1 g capsule TAKE ONE CAPSULE BY MOUTH EVERY 12 HOURS 05/02/19   Nida, Marella Chimes, MD     Family History  Problem Relation Age of Onset   Colon polyps Mother     Hypertension Mother    High Cholesterol Father    Heart disease Father    COPD Father    Healthy Other    Healthy Daughter    Colon cancer Neg Hx     Social History   Socioeconomic History   Marital status: Married    Spouse name: Architect   Number of children: Not on file   Years of education: Not on file   Highest education level: Not on file  Occupational History   Occupation: Conservation officer, historic buildings  Tobacco Use   Smoking status: Former    Packs/day: 2.00    Years: 30.00    Pack years: 60.00    Types: Cigarettes    Quit date: 02/02/2004    Years since quitting: 8.3  Smokeless tobacco: Never  Vaping Use   Vaping Use: Never used  Substance and Sexual Activity   Alcohol use: Yes    Comment: occasional   Drug use: No   Sexual activity: Yes    Birth control/protection: None  Other Topics Concern   Not on file  Social History Narrative   Not on file   Social Determinants of Health   Financial Resource Strain: Not on file  Food Insecurity: Not on file  Transportation Needs: Not on file  Physical Activity: Not on file  Stress: Not on file  Social Connections: Not on file    Review of Systems: A 12 point ROS discussed and pertinent positives are indicated in the HPI above.  All other systems are negative.  Review of Systems  Vital Signs: BP (!) 148/93   Pulse 73   Temp 98.1 F (36.7 C) (Oral)   Resp 20   Ht 5\' 10"  (1.778 m)   Wt 127.9 kg   SpO2 97%   BMI 40.46 kg/m   Physical Exam Constitutional:      Appearance: He is obese. He is not ill-appearing.  HENT:     Mouth/Throat:     Mouth: Mucous membranes are moist.     Pharynx: Oropharynx is clear.  Cardiovascular:     Rate and Rhythm: Normal rate and regular rhythm.     Heart sounds: Normal heart sounds.  Pulmonary:     Effort: Pulmonary effort is normal. No respiratory distress.     Breath sounds: Normal breath sounds.  Abdominal:     Palpations: Abdomen is soft.     Tenderness: There is  no abdominal tenderness. There is no guarding.  Skin:    General: Skin is warm and dry.  Neurological:     General: No focal deficit present.     Mental Status: He is alert and oriented to person, place, and time.  Psychiatric:        Mood and Affect: Mood normal.        Thought Content: Thought content normal.      Imaging: US RENAL  Result Date: 05/13/2021 CLINICAL DATA:  Acute renal failure EXAM: RENAL / URINARY TRACT ULTRASOUND COMPLETE COMPARISON:  Ultrasound 11/05/2017 FINDINGS: Right Kidney: Renal measurements: 15.2 x 6.8 x 7.7 cm = volume: 416.7 mL. Echogenicity within normal limits. No mass or hydronephrosis visualized. Left Kidney: Renal measurements: 13 x 6.8 x 5.7 cm = volume: 263.4 mL. Echogenicity within normal limits. No hydronephrosis. Small cyst in the upper pole measuring up to 15 mm. Bladder: Appears normal for degree of bladder distention. Other: None. IMPRESSION: 1. Kidneys appear large in size bilaterally but without hydronephrosis nor significant change compared to prior ultrasound. 2. Small cysts in the left kidney Electronically Signed   By: Donavan Foil M.D.   On: 05/13/2021 22:16    Labs:  CBC: Recent Labs    06/07/21 0625  WBC 12.8*  HGB 13.4  HCT 41.8  PLT 304    COAGS: No results for input(s): INR, APTT in the last 8760 hours.  BMP: Recent Labs    09/26/20 1429 04/30/21 1057  NA 136 138  K 4.1 4.6  CL 104 101  CO2 19* 18*  GLUCOSE 139* 124*  BUN 41* 30*  CALCIUM 9.4 9.6  CREATININE 2.03* 1.96*  GFRNONAA 35*  --     LIVER FUNCTION TESTS: Recent Labs    02/14/21 1400 04/30/21 1057  BILITOT  --  0.3  AST  --  25  ALT  --  31  ALKPHOS  --  60  PROT 7.2 7.4  ALBUMIN  --  4.4    TUMOR MARKERS: No results for input(s): AFPTM, CEA, CA199, CHROMGRNA in the last 8760 hours.  Assessment and Plan: CKD with hyperphosphatemia For US guided random renal biopsy Labs pending Risks and benefits of renal biopsy was discussed with the  patient and/or patient's family including, but not limited to bleeding, infection, damage to adjacent structures or low yield requiring additional tests.  All of the questions were answered and there is agreement to proceed.  Consent signed and in chart.   Thank you for this interesting consult.  I greatly enjoyed meeting Brandon Warren and look forward to participating in their care.  A copy of this report was sent to the requesting provider on this date.  Electronically Signed: Ascencion Dike, PA-C 06/07/2021, 7:30 AM   I spent a total of 20 minutes in face to face in clinical consultation, greater than 50% of which was counseling/coordinating care for renal biopsy

## 2021-06-07 NOTE — Procedures (Signed)
  Procedure: [place] core renal  RLP x2 EBL:   minimal Complications:  none immediate  See full dictation in [facility].  Dillard Cannon MD Main # [phone] Pager  [phone]

## 2021-06-18 LAB — SURGICAL PATHOLOGY

## 2021-06-19 ENCOUNTER — Encounter (HOSPITAL_COMMUNITY): Payer: Self-pay

## 2021-06-26 DIAGNOSIS — I10 Essential (primary) hypertension: Secondary | ICD-10-CM | POA: Diagnosis not present

## 2021-06-26 DIAGNOSIS — E063 Autoimmune thyroiditis: Secondary | ICD-10-CM | POA: Diagnosis not present

## 2021-06-26 DIAGNOSIS — E7849 Other hyperlipidemia: Secondary | ICD-10-CM | POA: Diagnosis not present

## 2021-06-26 DIAGNOSIS — E119 Type 2 diabetes mellitus without complications: Secondary | ICD-10-CM | POA: Diagnosis not present

## 2021-07-02 DIAGNOSIS — E1129 Type 2 diabetes mellitus with other diabetic kidney complication: Secondary | ICD-10-CM | POA: Diagnosis not present

## 2021-07-02 DIAGNOSIS — D638 Anemia in other chronic diseases classified elsewhere: Secondary | ICD-10-CM | POA: Diagnosis not present

## 2021-07-02 DIAGNOSIS — E1122 Type 2 diabetes mellitus with diabetic chronic kidney disease: Secondary | ICD-10-CM | POA: Diagnosis not present

## 2021-07-02 DIAGNOSIS — I5032 Chronic diastolic (congestive) heart failure: Secondary | ICD-10-CM | POA: Diagnosis not present

## 2021-07-02 DIAGNOSIS — N189 Chronic kidney disease, unspecified: Secondary | ICD-10-CM | POA: Diagnosis not present

## 2021-07-02 DIAGNOSIS — R808 Other proteinuria: Secondary | ICD-10-CM | POA: Diagnosis not present

## 2021-07-02 DIAGNOSIS — E559 Vitamin D deficiency, unspecified: Secondary | ICD-10-CM | POA: Diagnosis not present

## 2021-07-02 DIAGNOSIS — E872 Acidosis: Secondary | ICD-10-CM | POA: Diagnosis not present

## 2021-07-02 DIAGNOSIS — R809 Proteinuria, unspecified: Secondary | ICD-10-CM | POA: Diagnosis not present

## 2021-07-09 DIAGNOSIS — R808 Other proteinuria: Secondary | ICD-10-CM | POA: Diagnosis not present

## 2021-07-09 DIAGNOSIS — Z7189 Other specified counseling: Secondary | ICD-10-CM | POA: Diagnosis not present

## 2021-07-09 DIAGNOSIS — R809 Proteinuria, unspecified: Secondary | ICD-10-CM | POA: Diagnosis not present

## 2021-07-09 DIAGNOSIS — D638 Anemia in other chronic diseases classified elsewhere: Secondary | ICD-10-CM | POA: Diagnosis not present

## 2021-07-09 DIAGNOSIS — E872 Acidosis: Secondary | ICD-10-CM | POA: Diagnosis not present

## 2021-07-09 DIAGNOSIS — N189 Chronic kidney disease, unspecified: Secondary | ICD-10-CM | POA: Diagnosis not present

## 2021-07-09 DIAGNOSIS — N041 Nephrotic syndrome with focal and segmental glomerular lesions: Secondary | ICD-10-CM | POA: Diagnosis not present

## 2021-07-09 DIAGNOSIS — E559 Vitamin D deficiency, unspecified: Secondary | ICD-10-CM | POA: Diagnosis not present

## 2021-07-11 DIAGNOSIS — Z23 Encounter for immunization: Secondary | ICD-10-CM | POA: Diagnosis not present

## 2021-07-11 DIAGNOSIS — E1129 Type 2 diabetes mellitus with other diabetic kidney complication: Secondary | ICD-10-CM | POA: Diagnosis not present

## 2021-07-11 DIAGNOSIS — E6609 Other obesity due to excess calories: Secondary | ICD-10-CM | POA: Diagnosis not present

## 2021-07-11 DIAGNOSIS — Z6836 Body mass index (BMI) 36.0-36.9, adult: Secondary | ICD-10-CM | POA: Diagnosis not present

## 2021-07-19 ENCOUNTER — Encounter: Payer: Self-pay | Admitting: Endocrinology

## 2021-07-19 ENCOUNTER — Other Ambulatory Visit: Payer: Self-pay

## 2021-07-19 ENCOUNTER — Ambulatory Visit (INDEPENDENT_AMBULATORY_CARE_PROVIDER_SITE_OTHER): Payer: Medicare HMO | Admitting: Endocrinology

## 2021-07-19 DIAGNOSIS — E1169 Type 2 diabetes mellitus with other specified complication: Secondary | ICD-10-CM | POA: Diagnosis not present

## 2021-07-19 DIAGNOSIS — E038 Other specified hypothyroidism: Secondary | ICD-10-CM

## 2021-07-19 DIAGNOSIS — E669 Obesity, unspecified: Secondary | ICD-10-CM | POA: Diagnosis not present

## 2021-07-19 LAB — POCT GLYCOSYLATED HEMOGLOBIN (HGB A1C): Hemoglobin A1C: 6.8 % — AB (ref 4.0–5.6)

## 2021-07-19 MED ORDER — BROMOCRIPTINE MESYLATE 2.5 MG PO TABS: 1.2500 mg | ORAL_TABLET | Freq: Every day | ORAL | 3 refills | Status: DC

## 2021-07-19 MED ORDER — TRULICITY 4.5 MG/0.5ML ~~LOC~~ SOAJ: 4.5000 mg | SUBCUTANEOUS | 3 refills | Status: DC

## 2021-07-19 NOTE — Progress Notes (Signed)
Subjective:    Patient ID: Brandon Warren, male    DOB: 1950/12/28, 70 y.o.   MRN: [id number]  HPI Pt is referred by Dr Gerarda Fraction, for hypothyroidism.  Pt reports hypothyroidism was dx'ed in 1993.  He has been on prescribed thyroid hormone therapy since dx.  He has never taken kelp or any other type of non-prescribed thyroid product.  He has never had thyroid imaging.  He has never had thyroid surgery, or XRT to the neck.  He has not recently taken amiodarone.  He has never taken lithium.  He has been on his current 225 mcg/d, x 1 month.  His dosage has varied from 200 to 250 mcg/d, over the past few years.  Pt says he takes levothyroxine on an empty stomach, with water only.  He says he does not have any GI condition that would interfere with med absorption.   Pt also requests T0P.  he takes Trulicity and Tradjenta.  He has never taken insulin.  He says cbg varies from 130-180.  He checks fasting.   Past Medical History:  Diagnosis Date   Arthritis    Back pain    BPH (benign prostatic hyperplasia)    CAD S/P percutaneous coronary angioplasty    a. 2005: PCI OM - Zomax study stent DES  2.5 x16 mm. b. cath in 02/2018 showing 20% ISR of LCx stent and 20% stenosis along mid-distal LM.    Controlled type 2 diabetes with neuropathy (HCC)    Edema of both lower extremities    Food allergy    Gout    Hypercholesterolemia    Hypertension    Hypothyroidism    Kidney disease    MI (myocardial infarction) ([place])    in his 42s   Sleep apnea    SOB (shortness of breath)     Past Surgical History:  Procedure Laterality Date   APPENDECTOMY     CATARACT EXTRACTION Left    COLONOSCOPY N/A 02/01/2014   Procedure: COLONOSCOPY;  Surgeon: Daneil Dolin, MD;  [facility];  Service: Endoscopy;  Laterality: N/A;  8:30   CORONARY ANGIOPLASTY WITH STENT PLACEMENT  2005   PCI- OM1 Zomax study stent 2.5 x16 mm.   LEFT HEART CATH AND CORONARY ANGIOGRAPHY N/A 03/15/2018   Procedure: LEFT HEART CATH  AND CORONARY ANGIOGRAPHY;  Surgeon: Leonie Man, MD;  Location: [facility];  Service: Cardiovascular;  Laterality: N/A;    Social History   Socioeconomic History   Marital status: Married    Spouse name: Dor   Number of children: Not on file   Years of education: Not on file   Highest education level: Not on file  Occupational History   Occupation: Conservation officer, historic buildings  Tobacco Use   Smoking status: Former    Packs/day: 2.00    Years: 30.00    Pack years: 60.00    Types: Cigarettes    Quit date: 02/02/2004    Years since quitting: 17.4   Smokeless tobacco: Never  Vaping Use   Vaping Use: Never used  Substance and Sexual Activity   Alcohol use: Yes    Comment: occasional   Drug use: No   Sexual activity: Yes    Birth control/protection: None  Other Topics Concern   Not on file  Social History Narrative   Not on file   Social Determinants of Health   Financial Resource Strain: Not on file  Food Insecurity: Not on file  Transportation  Needs: Not on file  Physical Activity: Not on file  Stress: Not on file  Social Connections: Not on file  Intimate Partner Violence: Not on file    Current Outpatient Medications on File Prior to Visit  Medication Sig Dispense Refill   acetaminophen (TYLENOL) 500 MG tablet Take 500 mg by mouth 2 (two) times daily as needed (for pain.).     ALPRAZolam (XANAX) 0.5 MG tablet Take 0.5 mg by mouth daily as needed for anxiety.     amLODipine (NORVASC) 5 MG tablet Take 1 tablet (5 mg total) by mouth in the morning and at bedtime. 180 tablet 3   aspirin EC 81 MG tablet Take 81 mg by mouth every evening.     Cholecalciferol (VITAMIN D3) 5000 units CAPS Take 1 capsule (5,000 Units total) by mouth daily. 90 capsule 0   diazepam (VALIUM) 10 MG tablet Take 10 mg by mouth daily as needed (muscle spasms).     EPINEPHrine 0.3 mg/0.3 mL IJ SOAJ injection Inject 0.3 mg into the muscle as needed for anaphylaxis.     ezetimibe (ZETIA) 10  MG tablet Take 10 mg by mouth at bedtime.     febuxostat (ULORIC) 40 MG tablet TAKE ONE TABLET (40MG ) BY MOUTH DAILY 30 tablet 1   ferrous sulfate 325 (65 FE) MG tablet Take 325 mg by mouth 3 (three) times daily.     finasteride (PROSCAR) 5 MG tablet Take 5 mg by mouth daily.      furosemide (LASIX) 20 MG tablet Take 20 mg daily if you have more than 3 lbs weight gain in 24 hrs (Patient taking differently: Take 10 mg by mouth 2 (two) times daily.) 30 tablet 3   halobetasol (ULTRAVATE) 0.05 % cream Apply 1 application topically 3 (three) times daily as needed for rash.     hydrALAZINE (APRESOLINE) 100 MG tablet Take 1 tablet (100 mg total) by mouth 3 (three) times daily. 270 tablet 3   HYDROcodone-acetaminophen (NORCO) 10-325 MG tablet Take 1-2 tablets by mouth every 6 (six) hours as needed for moderate pain.     ibuprofen (ADVIL,MOTRIN) 200 MG tablet Take 400 mg by mouth 2 (two) times daily as needed (for pain.).     levothyroxine (SYNTHROID) 25 MCG tablet TAKE ONE TABLET (25MCG TOTAL) BY MOUTH DAILY BEFORE BREAKFAST. TAKE ALONG WITH 200MCG FOR TOTAL DOSE OF 225MCG. (Patient taking differently: Take 25 mcg by mouth daily before breakfast. Take with 200 mcg to equal 225 mcg daily) 90 tablet 0   levothyroxine (SYNTHROID, LEVOTHROID) 200 MCG tablet TAKE ONE TABLET BY MOUTH EVERY DAY BEFORE BREAKFAST (Patient taking differently: Take 200 mcg by mouth daily before breakfast. Take with 25 mcg to equal 225 mcg daily) 90 tablet 0   losartan (COZAAR) 100 MG tablet TAKE ONE TABLET (100MG  TOTAL) BY MOUTH DAILY 7 tablet 0   metFORMIN (GLUCOPHAGE) 500 MG tablet Take 1 tablet (500 mg total) by mouth 2 (two) times daily with breakfast and lunch. 60 tablet 0   MITIGARE 0.6 MG CAPS Take 0.6 mg by mouth 2 (two) times daily as needed (gout flare).     omega-3 acid ethyl esters (LOVAZA) 1 g capsule TAKE ONE CAPSULE BY MOUTH EVERY 12 HOURS 7 capsule 0   OVER THE COUNTER MEDICATION Apply 1 application topically daily as  needed (rash). Terrasil cream     OVER THE COUNTER MEDICATION Apply 1 application topically daily as needed (itching). E45 itch relief cream     tamsulosin (FLOMAX) 0.4  MG CAPS capsule TAKE ONE (1) CAPSULE BY MOUTH TWICE A DAY. (EVERY 12 HOURS.) 180 capsule 11   carvedilol (COREG) 25 MG tablet Take 1.5 tablets (37.5 mg total) by mouth 2 (two) times daily. 270 tablet 3   No current facility-administered medications on file prior to visit.    Allergies  Allergen Reactions   Meat Extract Diarrhea, Nausea Only and Other (See Comments)    alphagal reaction   Lipitor [Atorvastatin] Other (See Comments)    Muscle cramps   Simvastatin Other (See Comments)    Muscle cramps    Allopurinol Rash    Family History  Problem Relation Age of Onset   Colon polyps Mother    Hypertension Mother    High Cholesterol Father    Heart disease Father    COPD Father    Thyroid disease Daughter    Healthy Daughter    Healthy Other    Colon cancer Neg Hx     BP (!) 160/80 (BP Location: Right Arm, Patient Position: Sitting, Cuff Size: Large)   Pulse 76   Ht 5\' 10"  (1.778 m)   Wt 292 lb 9.6 oz (132.7 kg)   SpO2 95%   BMI 41.98 kg/m   Review of Systems Denies depression/N/V/HB.  He has widely varying weight, dry skin, cold intolerance, and mild memory loss.     Objective:   Physical Exam VS: see vs page GEN: no distress HEAD: head: no deformity eyes: no periorbital swelling, no proptosis external nose and ears are normal NECK: supple, thyroid is not enlarged CHEST WALL: no deformity LUNGS: clear to auscultation CV: reg rate and rhythm, no murmur.  MUSCULOSKELETAL: gait is normal and steady EXTEMITIES: no deformity.  1+ bilat leg edema NEURO:  readily moves all 4's.  sensation is intact to touch on all 4's SKIN:  Normal texture and temperature.  No rash or suspicious lesion is visible.   NODES:  None palpable at the neck PSYCH: alert, well-oriented.  Does not appear anxious nor  depressed.  Lab Results  Component Value Date   CREATININE 1.96 (H) 04/30/2021   BUN 30 (H) 04/30/2021   NA 138 04/30/2021   K 4.6 04/30/2021   CL 101 04/30/2021   CO2 18 (L) 04/30/2021   Lab Results  Component Value Date   HGBA1C 6.8 (A) 07/19/2021    CT chest (2005): no mention is made of the thyroid.   I have reviewed outside records, and summarized: Pt was noted to have abnormal TFT, and referred here.  She was noted to have elev prolactin, but MRI was deferred, due to low risk of adenoma.       Assessment & Plan:  Type 2 DM: well-controlled Hypothyroidism: recheck today.  Patient Instructions  Your blood pressure is high today.  Please see your primary care provider soon, to have it rechecked.   Blood tests are requested for you today.  We'll let you know about the results.  Please stop taking the Tradjenta, and I have sent a prescription to your pharmacy, to increase the Trulicity.   check your blood sugar once a day.  vary the time of day when you check, between before the 3 meals, and at bedtime.  also check if you have symptoms of your blood sugar being too high or too low.  please keep a record of the readings and bring it to your next appointment here (or you can bring the meter itself).  You can write it on any piece  of paper.  please call us sooner if your blood sugar goes below 70, or if most of your readings are over 200. I have sent a prescription to your pharmacy, to add bromocriptine.   Please come back for a follow-up appointment in 2 months.

## 2021-07-19 NOTE — Patient Instructions (Addendum)
Your blood pressure is high today.  Please see your primary care provider soon, to have it rechecked.   Blood tests are requested for you today.  We'll let you know about the results.  Please stop taking the Tradjenta, and I have sent a prescription to your pharmacy, to increase the Trulicity.   check your blood sugar once a day.  vary the time of day when you check, between before the 3 meals, and at bedtime.  also check if you have symptoms of your blood sugar being too high or too low.  please keep a record of the readings and bring it to your next appointment here (or you can bring the meter itself).  You can write it on any piece of paper.  please call us sooner if your blood sugar goes below 70, or if most of your readings are over 200. I have sent a prescription to your pharmacy, to add bromocriptine.   Please come back for a follow-up appointment in 2 months.

## 2021-07-22 ENCOUNTER — Other Ambulatory Visit: Payer: Self-pay | Admitting: Internal Medicine

## 2021-07-22 NOTE — Telephone Encounter (Signed)
Rov made for 08/2021. Patient did not miss appointment in May. Patient was in contact with Dr. Benjamine Mola since then about new medication.

## 2021-07-22 NOTE — Telephone Encounter (Signed)
Next Visit: Patient has cancelled last 4 office visits,  Return in about 4 weeks (around 03/19/2021) for Gout new allpourinol start f/u.  Last Visit: 02/19/2021  Last Fill: 05/21/2021, TAKE ONE TABLET ($RemoveBef'40MG'EihQmufpMr$ ) BY MOUTH DAILY # 30 RF 1  DX:  Secondary gout   Current Dose per office note 02/19/2021 not mentioned  Labs: 06/07/2021, WBC 12.8, Glucose 154, 04/30/2021 uric acid 6.1, BUN 30, Creatinine 1.96, eGFR 36, CO2 18,   Okay to refill Uloric?

## 2021-07-22 NOTE — Telephone Encounter (Signed)
Next Visit: Due June 2022. Patient cancelled appointment 04/17/2021 and 05/01/2021. No follow up scheduled.   Last Visit: 02/19/2021  Last Fill: 05/21/2021  DX: Secondary gout  Current Dose per phone note 03/18/2021: Febuxostat 40 mg Oral Daily  Labs: 04/30/2021 Uric Acid 6.1, Glucose 124, BUN 30, Creatinine 1.96, GFR 36, CO2 18, 06/07/2021 CBC- WBC 12.8  Okay to refill Uloric?

## 2021-07-22 NOTE — Telephone Encounter (Signed)
Please call patient to schedule appt, Return in about 4 weeks (around 03/19/2021) for Gout new allpourinol start f/u.   Thank you.

## 2021-08-02 ENCOUNTER — Encounter: Payer: Self-pay | Admitting: Cardiology

## 2021-08-02 ENCOUNTER — Ambulatory Visit: Payer: Medicare HMO | Admitting: Cardiology

## 2021-08-02 ENCOUNTER — Other Ambulatory Visit: Payer: Self-pay

## 2021-08-02 VITALS — BP 118/68 | HR 74 | Ht 70.5 in | Wt 294.0 lb

## 2021-08-02 DIAGNOSIS — I25119 Atherosclerotic heart disease of native coronary artery with unspecified angina pectoris: Secondary | ICD-10-CM | POA: Diagnosis not present

## 2021-08-02 DIAGNOSIS — G4730 Sleep apnea, unspecified: Secondary | ICD-10-CM | POA: Diagnosis not present

## 2021-08-02 DIAGNOSIS — Z79899 Other long term (current) drug therapy: Secondary | ICD-10-CM

## 2021-08-02 DIAGNOSIS — I2510 Atherosclerotic heart disease of native coronary artery without angina pectoris: Secondary | ICD-10-CM

## 2021-08-02 DIAGNOSIS — R002 Palpitations: Secondary | ICD-10-CM

## 2021-08-02 NOTE — Patient Instructions (Signed)
Medication Instructions:  Your physician recommends that you continue on your current medications as directed. Please refer to the Current Medication list given to you today.  *If you need a refill on your cardiac medications before your next appointment, please call your pharmacy*   Lab Work: None If you have labs (blood work) drawn today and your tests are completely normal, you will receive your results only by: [place] (if you have MyChart) OR A paper copy in the mail If you have any lab test that is abnormal or we need to change your treatment, we will call you to review the results.   Testing/Procedures: None   Follow-Up: At [facility], you and your health needs are our priority.  As part of our continuing mission to provide you with exceptional heart care, we have created designated Provider Care Teams.  These Care Teams include your primary Cardiologist (physician) and Advanced Practice Providers (APPs -  Physician Assistants and Nurse Practitioners) who all work together to provide you with the care you need, when you need it.  We recommend signing up for the patient portal called "MyChart".  Sign up information is provided on this After Visit Summary.  MyChart is used to connect with patients for Virtual Visits (Telemedicine).  Patients are able to view lab/test results, encounter notes, upcoming appointments, etc.  Non-urgent messages can be sent to your provider as well.   To learn more about what you can do with MyChart, go to [email or web address].    Your next appointment:   2 month(s)  The format for your next appointment:   In Person  Provider:   Carlyle Dolly, MD   Other Instructions You have been referred to Pulmonology. They will call you with your first appointment.

## 2021-08-02 NOTE — Progress Notes (Signed)
Clinical Summary [name] is a 70 y.o.male seen today for follow up of the following medical problems.   1. HTN - compliant with meds     2. Palpitations - feeling of heart racing at times, associated with pressure upper midchest.  - HR up to 140 with pulse ox. Can last up to 10 min. Some lightheadedness. Sudden onset and termination - few cups of coffee a daily, rare EtOH.    - 06/2020 event monitor benign, just rare PVCs - one episode since last visit, HR was up to 155 for about 2 minutes.   - recent increase in elevated HRs. Rates up to 150s at times at night with associated palpitations - reports some recent issues with his thryoid, seeing his endocrinologist next week with repeat labs - recent significant stress from health issues with his wife       3. OSA - has cpap, does not have a doctor following - got cpap 4 years ago.  - needs to establish with pulmonary for f/u. Was not able to last referal due to sudden health issues with his wife.       4. . CAD - 2005 PCI to OM with DES - cath 02/2018 with 20% LM, 20% prox LCX with a widely patent stent - no recent chest pain   5. Hyperlipidemia - muscle cramps on lipitor, zocor previously - has been on zetia, 03/2020 LDL 95 - labs followe dby pcp  6. CKD  - followed by Dr Lissa Morales: wife with recent complex hernia surgery, complicated by sepsis and needed repeat surgery. She is doing well now.    Past Medical History:  Diagnosis Date   Arthritis    Back pain    BPH (benign prostatic hyperplasia)    CAD S/P percutaneous coronary angioplasty    a. 2005: PCI OM - Zomax study stent DES  2.5 x16 mm. b. cath in 02/2018 showing 20% ISR of LCx stent and 20% stenosis along mid-distal LM.    Controlled type 2 diabetes with neuropathy (HCC)    Edema of both lower extremities    Food allergy    Gout    Hypercholesterolemia    Hypertension    Hypothyroidism    Kidney disease    MI (myocardial infarction) ([place])     in his 98s   Sleep apnea    SOB (shortness of breath)      Allergies  Allergen Reactions   Meat Extract Diarrhea, Nausea Only and Other (See Comments)    alphagal reaction   Lipitor [Atorvastatin] Other (See Comments)    Muscle cramps   Simvastatin Other (See Comments)    Muscle cramps    Allopurinol Rash     Current Outpatient Medications  Medication Sig Dispense Refill   acetaminophen (TYLENOL) 500 MG tablet Take 500 mg by mouth 2 (two) times daily as needed (for pain.).     ALPRAZolam (XANAX) 0.5 MG tablet Take 0.5 mg by mouth daily as needed for anxiety.     amLODipine (NORVASC) 5 MG tablet Take 1 tablet (5 mg total) by mouth in the morning and at bedtime. 180 tablet 3   aspirin EC 81 MG tablet Take 81 mg by mouth every evening.     bromocriptine (PARLODEL) 2.5 MG tablet Take 0.5 tablets (1.25 mg total) by mouth at bedtime. 45 tablet 3   carvedilol (COREG) 25 MG tablet Take 1.5 tablets (37.5 mg total) by mouth 2 (two) times  daily. 270 tablet 3   Cholecalciferol (VITAMIN D3) 5000 units CAPS Take 1 capsule (5,000 Units total) by mouth daily. 90 capsule 0   diazepam (VALIUM) 10 MG tablet Take 10 mg by mouth daily as needed (muscle spasms).     Dulaglutide (TRULICITY) 4.5 MP/5.3IR SOPN Inject 4.5 mg as directed once a week. 6 mL 3   EPINEPHrine 0.3 mg/0.3 mL IJ SOAJ injection Inject 0.3 mg into the muscle as needed for anaphylaxis.     ezetimibe (ZETIA) 10 MG tablet Take 10 mg by mouth at bedtime.     febuxostat (ULORIC) 40 MG tablet TAKE ONE TABLET (40MG ) BY MOUTH DAILY 30 tablet 1   ferrous sulfate 325 (65 FE) MG tablet Take 325 mg by mouth 3 (three) times daily.     finasteride (PROSCAR) 5 MG tablet Take 5 mg by mouth daily.      furosemide (LASIX) 20 MG tablet Take 20 mg daily if you have more than 3 lbs weight gain in 24 hrs (Patient taking differently: Take 10 mg by mouth 2 (two) times daily.) 30 tablet 3   halobetasol (ULTRAVATE) 0.05 % cream Apply 1 application  topically 3 (three) times daily as needed for rash.     hydrALAZINE (APRESOLINE) 100 MG tablet Take 1 tablet (100 mg total) by mouth 3 (three) times daily. 270 tablet 3   HYDROcodone-acetaminophen (NORCO) 10-325 MG tablet Take 1-2 tablets by mouth every 6 (six) hours as needed for moderate pain.     ibuprofen (ADVIL,MOTRIN) 200 MG tablet Take 400 mg by mouth 2 (two) times daily as needed (for pain.).     levothyroxine (SYNTHROID) 25 MCG tablet TAKE ONE TABLET (25MCG TOTAL) BY MOUTH DAILY BEFORE BREAKFAST. TAKE ALONG WITH 200MCG FOR TOTAL DOSE OF 225MCG. (Patient taking differently: Take 25 mcg by mouth daily before breakfast. Take with 200 mcg to equal 225 mcg daily) 90 tablet 0   levothyroxine (SYNTHROID, LEVOTHROID) 200 MCG tablet TAKE ONE TABLET BY MOUTH EVERY DAY BEFORE BREAKFAST (Patient taking differently: Take 200 mcg by mouth daily before breakfast. Take with 25 mcg to equal 225 mcg daily) 90 tablet 0   losartan (COZAAR) 100 MG tablet TAKE ONE TABLET (100MG  TOTAL) BY MOUTH DAILY 7 tablet 0   metFORMIN (GLUCOPHAGE) 500 MG tablet Take 1 tablet (500 mg total) by mouth 2 (two) times daily with breakfast and lunch. 60 tablet 0   MITIGARE 0.6 MG CAPS Take 0.6 mg by mouth 2 (two) times daily as needed (gout flare).     omega-3 acid ethyl esters (LOVAZA) 1 g capsule TAKE ONE CAPSULE BY MOUTH EVERY 12 HOURS 7 capsule 0   OVER THE COUNTER MEDICATION Apply 1 application topically daily as needed (rash). Terrasil cream     OVER THE COUNTER MEDICATION Apply 1 application topically daily as needed (itching). E45 itch relief cream     tamsulosin (FLOMAX) 0.4 MG CAPS capsule TAKE ONE (1) CAPSULE BY MOUTH TWICE A DAY. (EVERY 12 HOURS.) 180 capsule 11   No current facility-administered medications for this visit.     Past Surgical History:  Procedure Laterality Date   APPENDECTOMY     CATARACT EXTRACTION Left    COLONOSCOPY N/A 02/01/2014   Procedure: COLONOSCOPY;  Surgeon: Daneil Dolin, MD;  [facility];  Service: Endoscopy;  Laterality: N/A;  8:30   CORONARY ANGIOPLASTY WITH STENT PLACEMENT  2005   PCI- OM1 Zomax study stent 2.5 x16 mm.   LEFT HEART CATH AND CORONARY  ANGIOGRAPHY N/A 03/15/2018   Procedure: LEFT HEART CATH AND CORONARY ANGIOGRAPHY;  Surgeon: Leonie Man, MD;  Location: [facility];  Service: Cardiovascular;  Laterality: N/A;     Allergies  Allergen Reactions   Meat Extract Diarrhea, Nausea Only and Other (See Comments)    alphagal reaction   Lipitor [Atorvastatin] Other (See Comments)    Muscle cramps   Simvastatin Other (See Comments)    Muscle cramps    Allopurinol Rash      Family History  Problem Relation Age of Onset   Colon polyps Mother    Hypertension Mother    High Cholesterol Father    Heart disease Father    COPD Father    Thyroid disease Daughter    Healthy Daughter    Healthy Other    Colon cancer Neg Hx      Social History Mr. Sherrod reports that he quit smoking about 17 years ago. His smoking use included cigarettes. He has a 60.00 pack-year smoking history. He has never used smokeless tobacco. Mr. Siler reports current alcohol use.   Review of Systems CONSTITUTIONAL: No weight loss, fever, chills, weakness or fatigue.  HEENT: Eyes: No visual loss, blurred vision, double vision or yellow sclerae.No hearing loss, sneezing, congestion, runny nose or sore throat.  SKIN: No rash or itching.  CARDIOVASCULAR: per hpi RESPIRATORY: No shortness of breath, cough or sputum.  GASTROINTESTINAL: No anorexia, nausea, vomiting or diarrhea. No abdominal pain or blood.  GENITOURINARY: No burning on urination, no polyuria NEUROLOGICAL: No headache, dizziness, syncope, paralysis, ataxia, numbness or tingling in the extremities. No change in bowel or bladder control.  MUSCULOSKELETAL: No muscle, back pain, joint pain or stiffness.  LYMPHATICS: No enlarged nodes. No history of splenectomy.  PSYCHIATRIC: No history of depression  or anxiety.  ENDOCRINOLOGIC: No reports of sweating, cold or heat intolerance. No polyuria or polydipsia.  Marland Kitchen   Physical Examination Today's Vitals   08/02/21 0922  BP: 118/68  Pulse: 74  SpO2: 94%  Weight: 294 lb (133.4 kg)  Height: 5' 10.5" (1.791 m)   Body mass index is 41.59 kg/m.  Gen: resting comfortably, no acute distress HEENT: no scleral icterus, pupils equal round and reactive, no palptable cervical adenopathy,  CV: RRR, no m/r/ gno jvd Resp: Clear to auscultation bilaterally GI: abdomen is soft, non-tender, non-distended, normal bowel sounds, no hepatosplenomegaly MSK: extremities are warm, no edema.  Skin: warm, no rash Neuro:  no focal deficits Psych: appropriate affect   Diagnostic Studies     Assessment and Plan   1. Palpitations - overall benign monitor, just rare PVCs - some recurrent palpitations, home HRs to 150s. Reporst some recent thyroid issues with endocrine appt and labs next week, f/u results -if ongoing symptoms may change his norvac to diltiazem, he is already on high dose coreg.  EKG today shows NSR   2. HTN - at goal today, continue current meds   3. OSA - we will refer to pulmonary   4. CAD - denies any symptoms, continue current meds   4. Hyperlipidemia -  did not tolerate statins on zetia - request labs from pcp  F/u 2 months. If ongong palpitations pending thryoid eval may consider repeat monitor. If needed could change norvasc to diltiazem.   Arnoldo Lenis, M.D.

## 2021-08-12 ENCOUNTER — Telehealth: Payer: Self-pay | Admitting: Endocrinology

## 2021-08-12 ENCOUNTER — Telehealth: Payer: Self-pay | Admitting: Pharmacy Technician

## 2021-08-12 ENCOUNTER — Other Ambulatory Visit (HOSPITAL_COMMUNITY): Payer: Self-pay

## 2021-08-12 ENCOUNTER — Other Ambulatory Visit: Payer: Self-pay

## 2021-08-12 DIAGNOSIS — E038 Other specified hypothyroidism: Secondary | ICD-10-CM

## 2021-08-12 DIAGNOSIS — E669 Obesity, unspecified: Secondary | ICD-10-CM

## 2021-08-12 MED ORDER — BROMOCRIPTINE MESYLATE 2.5 MG PO TABS: 1.2500 mg | ORAL_TABLET | Freq: Every day | ORAL | 3 refills | Status: DC

## 2021-08-12 MED ORDER — TRULICITY 4.5 MG/0.5ML ~~LOC~~ SOAJ: 4.5000 mg | SUBCUTANEOUS | 3 refills | Status: DC

## 2021-08-12 NOTE — Telephone Encounter (Signed)
[facility] called to tell us the medications bromocriptine (PARLODEL) and Dulaglutide (TRULICITY) are on back order until Nov. Reached out to patient and they chose [facility] [place] in the meantime to fill these meds. Pt contact [phone]

## 2021-08-12 NOTE — Telephone Encounter (Signed)
Rx sent 

## 2021-08-12 NOTE — Telephone Encounter (Signed)
Patient Advocate Encounter   Received notification from CoverMyME[employer] that prior authorization for Trulicity is required.   PA submitted on 08/12/21 Key [id number] Status is pending    Spoke with Pharmacsist about duplicate claim because of Tradjenta, but once he puts in an over ride for that it still says it needs PA. Found notes that say to stop Tradjenta and Dr. Loanne Drilling increased Trulicity.  [facility] will continue to follow:   Armanda Magic, CPhT [facility] Phone: [phone] Fax:  [phone]

## 2021-08-15 ENCOUNTER — Other Ambulatory Visit (HOSPITAL_COMMUNITY): Payer: Self-pay

## 2021-08-15 NOTE — Telephone Encounter (Signed)
[facility] Patient Advocate Encounter  Prior Authorization for Trulicity 4.5mg  has been approved.    PA# PA Case: [id number] Effective dates: 10/27/20 through 10/26/22  Filled 08/14/21  Armanda Magic, CPhT [facility] Phone: [phone] Fax:  [phone]

## 2021-09-09 DIAGNOSIS — E559 Vitamin D deficiency, unspecified: Secondary | ICD-10-CM | POA: Diagnosis not present

## 2021-09-09 DIAGNOSIS — R808 Other proteinuria: Secondary | ICD-10-CM | POA: Diagnosis not present

## 2021-09-09 DIAGNOSIS — D638 Anemia in other chronic diseases classified elsewhere: Secondary | ICD-10-CM | POA: Diagnosis not present

## 2021-09-09 DIAGNOSIS — E1129 Type 2 diabetes mellitus with other diabetic kidney complication: Secondary | ICD-10-CM | POA: Diagnosis not present

## 2021-09-09 DIAGNOSIS — R809 Proteinuria, unspecified: Secondary | ICD-10-CM | POA: Diagnosis not present

## 2021-09-09 DIAGNOSIS — E872 Acidosis, unspecified: Secondary | ICD-10-CM | POA: Diagnosis not present

## 2021-09-09 DIAGNOSIS — N041 Nephrotic syndrome with focal and segmental glomerular lesions: Secondary | ICD-10-CM | POA: Diagnosis not present

## 2021-09-09 DIAGNOSIS — E1122 Type 2 diabetes mellitus with diabetic chronic kidney disease: Secondary | ICD-10-CM | POA: Diagnosis not present

## 2021-09-09 DIAGNOSIS — N189 Chronic kidney disease, unspecified: Secondary | ICD-10-CM | POA: Diagnosis not present

## 2021-09-09 NOTE — Progress Notes (Signed)
Office Visit Note  Patient: Brandon Warren             Date of Birth: [birth year]           MRN: [id number]             PCP: Redmond School, MD Referring: Redmond School, MD Visit Date: 09/10/2021   Subjective:  Rash (Bil lower leg rash)   History of Present Illness: Brandon Warren is a 70 y.o. 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 He had interval events with stopping azathioprine based on side effect and switching to uloric. He has also followed up with Dr. Dr. Theador Hawthorne for his chronic renal disease. He has not suffered any significant gout flare since our last visit. He has experienced increase in lower extremity rashes with redness, itching, and swelling on the lower shins and feet. He was using halobetasol up to 3 times daily but was running out of this medication so stopped using it regularly. He is also curious about getting the COVID booster dose.  Previous HPI 02/19/21 Brandon Warren is a 70 y.o. male here for follow up for gout initial visit with labs showing uric acid of 9.9 and high ESR.  Since last visit he denies any significant change in joint symptoms still having foot pain and swelling and stiffness in the knee but is concerned for possible UTI based on increase in urinary urgency mild leakage and an overall sense of brain fog or confusion.  He has a history of prostate hypertrophy with urinary retention but not incontinence.  He denies any pain or blood with urination.   02/11/21 Brandon Warren is a 70 y.o. male with a history of hypertension, hypothyroidism, coronary artery disease, type 2 diabetes, hyperprolactinemia, and CKD stage IIIb here for evaluation of elevated sedimentation rate and joint pain in multiple sites.  He describes symptoms starting about 2 years ago initially with pain and inflammation in the left great toe originally diagnosed as gout.  He has inflammatory episodes alternating between the right and left feet initially but  after several months started to develop pain and swelling in other areas.  He had right knee inflammation with associated warmth and had the aspiration apparently showing clear fluid.  The symptoms have recurred multiple times he describes it as swelling and warmth but pain is more radiating proximal and distal to the joint itself.  He had an episode with this kind of inflammation in the right elbow.  He had left shoulder pain with work-up was attributed to impingement syndrome symptoms have partially improved.  He now describes increasing symptoms in the right shoulder.  He does have radiation of numbness to the second and third digits on the right hand.  This sometimes causes weakness or dropping items.  He states individual episodes of joint swelling last 3 to 4 days do not seem to improve any more quickly when he takes colchicine then when he does not.  He is not on any maintenance urate lowering therapy.   Besides the joint pains he is also noticed developing small red lesions on both anterior shins. No spontaneous changes and partial loss of the left great toenail.  He has increased redness and swelling of the feet and a little bit above the right ankle.  He does have diabetic neuropathy he attributes this too but the increasing swelling is newer.   Labs reviewed 12/2020 ESR 65 eGFR 37  Review of Systems  Constitutional:  Negative for fatigue.  HENT:  Negative for mouth dryness.   Eyes:  Negative for dryness.  Respiratory:  Negative for shortness of breath.   Cardiovascular:  Positive for swelling in legs/feet.  Gastrointestinal:  Negative for constipation.  Endocrine: Positive for excessive thirst.  Genitourinary:  Negative for difficulty urinating.  Musculoskeletal:  Positive for joint pain, gait problem, joint pain, joint swelling, muscle weakness and morning stiffness.  Skin:  Positive for rash and redness.  Allergic/Immunologic: Negative for susceptible to infections.  Neurological:   Positive for numbness.  Hematological:  Negative for bruising/bleeding tendency.  Psychiatric/Behavioral:  Negative for sleep disturbance.    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   Dysuria 02/19/2021   Elevated sedimentation rate 02/11/2021   Bilateral shoulder pain 02/11/2021   Bilateral foot pain 02/11/2021   Bilateral hand pain 02/11/2021   CKD (chronic kidney disease) stage 3, GFR 30-59 ml/min (HCC) 04/17/2020   Diabetes mellitus type 2 in obese ([place]) 05/21/2017   Hyperprolactinemia ([place]) 05/12/2017   Chest pain 04/20/2016   Encounter for screening colonoscopy 01/24/2014   Pseudophakia, left eye 06/08/2013   Status post laser cataract surgery of left eye 05/25/2013   Bradycardia 05/20/2013   Dizziness 05/20/2013   Coronary artery disease involving native coronary artery of native heart with angina pectoris ([place]) 05/19/2013   Essential hypertension, benign 05/19/2013   Hypertriglyceridemia 05/19/2013   Hypothyroidism 05/19/2013   Morbid obesity ([place]) 05/19/2013   Amblyopia of right eye 05/02/2013   Combined form of senile cataract 05/02/2013    Past Medical History:  Diagnosis Date   Arthritis    Back pain    BPH (benign prostatic hyperplasia)    CAD S/P percutaneous coronary angioplasty    a. 2005: PCI OM - Zomax study stent DES  2.5 x16 mm. b. cath in 02/2018 showing 20% ISR of LCx stent and 20% stenosis along mid-distal LM.    Controlled type 2 diabetes with neuropathy (HCC)    Edema of both lower extremities    Food allergy    Gout    Hypercholesterolemia    Hypertension    Hypothyroidism    Kidney disease    MI (myocardial infarction) ([place])    in his 35s   Sleep apnea    SOB (shortness of breath)     Family History  Problem Relation Age of Onset   Colon polyps Mother    Hypertension Mother    High Cholesterol Father    Heart disease Father     COPD Father    Thyroid disease Daughter    Healthy Daughter    Healthy Other    Colon cancer Neg Hx    Past Surgical History:  Procedure Laterality Date   APPENDECTOMY     CATARACT EXTRACTION Left    COLONOSCOPY N/A 02/01/2014   Procedure: COLONOSCOPY;  Surgeon: Daneil Dolin, MD;  [facility];  Service: Endoscopy;  Laterality: N/A;  8:30   CORONARY ANGIOPLASTY WITH STENT PLACEMENT  2005   PCI- OM1 Zomax study stent 2.5 x16 mm.   LEFT HEART CATH AND CORONARY ANGIOGRAPHY N/A 03/15/2018   Procedure: LEFT HEART CATH AND CORONARY ANGIOGRAPHY;  Surgeon: Leonie Man, MD;  Location: [facility];  Service: Cardiovascular;  Laterality: N/A;   Social History   Social History Narrative   Not on  file    There is no immunization history on file for this patient.   Objective: Vital Signs: BP 125/64 (BP Location: Left Arm, Patient Position: Sitting, Cuff Size: Large)   Pulse 81   Resp 17   Ht 5\' 10"  (1.778 m)   Wt 294 lb (133.4 kg)   BMI 42.18 kg/m    Physical Exam Constitutional:      Appearance: He is obese.  Cardiovascular:     Rate and Rhythm: Normal rate and regular rhythm.  Pulmonary:     Effort: Pulmonary effort is normal.     Breath sounds: Normal breath sounds.  Skin:    General: Skin is warm and dry.     Findings: Erythema and rash present.     Comments: Skin thickening, toughness, hair loss over distal legs area with some erythema some superficial tortuous vessels or telangiectasias, some slight bleeding on foot at excoriated area No ulcerations no foot or toe discoloration  Neurological:     Mental Status: He is alert.  Psychiatric:        Mood and Affect: Mood normal.     Musculoskeletal Exam:  Elbows full ROM no tenderness or swelling Wrists full ROM no tenderness or swelling Fingers full ROM no tenderness or swelling Knees full ROM no tenderness or swelling Ankles full ROM no tenderness or swelling MTPs full ROM no tenderness or  swelling   Investigation: No additional findings.  Imaging: No results found.  Recent Labs: Lab Results  Component Value Date   WBC 12.8 (H) 06/07/2021   HGB 13.4 06/07/2021   PLT 304 06/07/2021   NA 138 04/30/2021   K 4.6 04/30/2021   CL 101 04/30/2021   CO2 18 (L) 04/30/2021   GLUCOSE 124 (H) 04/30/2021   BUN 30 (H) 04/30/2021   CREATININE 1.96 (H) 04/30/2021   BILITOT 0.3 04/30/2021   ALKPHOS 60 04/30/2021   AST 25 04/30/2021   ALT 31 04/30/2021   PROT 7.4 04/30/2021   ALBUMIN 4.4 04/30/2021   CALCIUM 9.6 04/30/2021   GFRAA 42 (L) 04/16/2020    Speciality Comments: No specialty comments available.  Procedures:  No procedures performed Allergies: Meat extract, Lipitor [atorvastatin], Simvastatin, and Allopurinol   Assessment / Plan:     Visit Diagnoses: Secondary gout - Plan: Uric acid, Sedimentation rate  Doing well so far on uloric 40 mg PO daily, will check uric acid and ESR today. If uric acid remains above goal will recommend increase to 80 mg dose. Metabolic panel has been stable when checked few months after starting does not appear to be causing any problem.  Rash and other nonspecific skin eruption - Plan: triamcinolone ointment (KENALOG) 0.5 %  Lower extremity changes may be stasis dermatitis process. I do not think it is coming directly from diabetic neuropathy since relative sparing of the foot compared to the shin and ankle. I discussed elevation, compression, topical steroids for symptoms at this time. If not improving maybe needs to see dermatology for further recommendation or medications.  Need for vaccination for COVID-19  Discussed vaccines I believe he would have no extra risk from influenza of COVID 19 booster vaccines and would benefit from getting these.  Orders: Orders Placed This Encounter  Procedures   Uric acid   Sedimentation rate    Meds ordered this encounter  Medications   triamcinolone ointment (KENALOG) 0.5 %    Sig:  Apply 1 application topically 2 (two) times daily as needed.    Dispense:  30 g    Refill:  0      Follow-Up Instructions: Return in about 3 months (around 12/11/2021) for Gout and rash f/u 72mos.   Collier Salina, MD  Note - This record has been created using [employer].  Chart creation errors have been sought, but may not always  have been located. Such creation errors do not reflect on  the standard of medical care.

## 2021-09-10 ENCOUNTER — Other Ambulatory Visit: Payer: Self-pay

## 2021-09-10 ENCOUNTER — Ambulatory Visit: Payer: Medicare HMO | Admitting: Internal Medicine

## 2021-09-10 ENCOUNTER — Encounter: Payer: Self-pay | Admitting: Internal Medicine

## 2021-09-10 VITALS — BP 125/64 | HR 81 | Resp 17 | Ht 70.0 in | Wt 294.0 lb

## 2021-09-10 DIAGNOSIS — R21 Rash and other nonspecific skin eruption: Secondary | ICD-10-CM | POA: Diagnosis not present

## 2021-09-10 DIAGNOSIS — M1040 Other secondary gout, unspecified site: Secondary | ICD-10-CM | POA: Diagnosis not present

## 2021-09-10 DIAGNOSIS — Z79899 Other long term (current) drug therapy: Secondary | ICD-10-CM | POA: Diagnosis not present

## 2021-09-10 MED ORDER — TRIAMCINOLONE ACETONIDE 0.5 % EX OINT: 1.0000 "application " | TOPICAL_OINTMENT | Freq: Two times a day (BID) | CUTANEOUS | 0 refills | Status: DC | PRN

## 2021-09-10 NOTE — Progress Notes (Signed)
Cardiology Office Note   Date:  09/11/2021   ID:  Brandon Warren, DOB Aug 22, 1951, MRN [id number]  PCP:  Redmond School, MD  Cardiologist:  Dr. Harl Bowie    Chief Complaint  Patient presents with   Palpitations      History of Present Illness: Brandon Warren is a 70 y.o. male who presents for HTN, palpitations with heart racing.  HR up to 140.  Event monitor with HR to 155 for 2 min.  He also has OSA on cpap.  Prior PCI to OM with DES in 2005 last cath 2019 with 20% LM, 20% prox LCX with a widely patent stent, hld on zetia due to muscle cramps on lipitor and zocor , CKD  followed by Dr. Theador Hawthorne.    Last visit 07/2021  F/u 2 months. If ongong palpitations pending thryoid eval may consider repeat monitor. If needed could change norvasc to diltiazem.   Today he continues with some palpitations and now he has strange feeling in chest at rest and he will place pulse ox on and HR will be 20.  He has no dizziness or lightheadedness.  But he is concerned.  No chest pain.  He has a rash on his lower legs - may be due to diabetes and he does have some neuropathy.  No pain with waking in lower legs.  Discussed plan to eval.   He does not exercise so asked him to do 10 min on treadmill daily.     Past Medical History:  Diagnosis Date   Arthritis    Back pain    BPH (benign prostatic hyperplasia)    CAD S/P percutaneous coronary angioplasty    a. 2005: PCI OM - Zomax study stent DES  2.5 x16 mm. b. cath in 02/2018 showing 20% ISR of LCx stent and 20% stenosis along mid-distal LM.    Controlled type 2 diabetes with neuropathy (HCC)    Edema of both lower extremities    Food allergy    Gout    Hypercholesterolemia    Hypertension    Hypothyroidism    Kidney disease    MI (myocardial infarction) ([place])    in his 67s   Sleep apnea    SOB (shortness of breath)     Past Surgical History:  Procedure Laterality Date   APPENDECTOMY     CATARACT EXTRACTION Left    COLONOSCOPY N/A 02/01/2014    Procedure: COLONOSCOPY;  Surgeon: Daneil Dolin, MD;  [facility];  Service: Endoscopy;  Laterality: N/A;  8:30   CORONARY ANGIOPLASTY WITH STENT PLACEMENT  2005   PCI- OM1 Zomax study stent 2.5 x16 mm.   LEFT HEART CATH AND CORONARY ANGIOGRAPHY N/A 03/15/2018   Procedure: LEFT HEART CATH AND CORONARY ANGIOGRAPHY;  Surgeon: Leonie Man, MD;  Location: [facility];  Service: Cardiovascular;  Laterality: N/A;     Current Outpatient Medications  Medication Sig Dispense Refill   acetaminophen (TYLENOL) 500 MG tablet Take 500 mg by mouth 2 (two) times daily as needed (for pain.).     ALPRAZolam (XANAX) 0.5 MG tablet Take 0.5 mg by mouth daily as needed for anxiety.     amLODipine (NORVASC) 5 MG tablet Take 1 tablet (5 mg total) by mouth in the morning and at bedtime. 180 tablet 3   aspirin EC 81 MG tablet Take 81 mg by mouth every evening.     bromocriptine (PARLODEL) 2.5 MG tablet Take 0.5 tablets (1.25 mg total)  by mouth at bedtime. 45 tablet 3   carvedilol (COREG) 25 MG tablet Take 1.5 tablets (37.5 mg total) by mouth 2 (two) times daily. 270 tablet 3   Cholecalciferol (VITAMIN D3) 5000 units CAPS Take 1 capsule (5,000 Units total) by mouth daily. 90 capsule 0   diazepam (VALIUM) 10 MG tablet Take 10 mg by mouth daily as needed (muscle spasms).     Dulaglutide (TRULICITY) 4.5 WI/0.9BD SOPN Inject 4.5 mg as directed once a week. 6 mL 3   EPINEPHrine 0.3 mg/0.3 mL IJ SOAJ injection Inject 0.3 mg into the muscle as needed for anaphylaxis.     ezetimibe (ZETIA) 10 MG tablet Take 10 mg by mouth at bedtime.     febuxostat (ULORIC) 40 MG tablet TAKE ONE TABLET (40MG ) BY MOUTH DAILY 30 tablet 1   ferrous sulfate 325 (65 FE) MG tablet Take 325 mg by mouth 3 (three) times daily.     finasteride (PROSCAR) 5 MG tablet Take 5 mg by mouth daily.      furosemide (LASIX) 20 MG tablet Take 20 mg daily if you have more than 3 lbs weight gain in 24 hrs (Patient taking differently: Take  20 mg by mouth 2 (two) times daily.) 30 tablet 3   hydrALAZINE (APRESOLINE) 100 MG tablet Take 1 tablet (100 mg total) by mouth 3 (three) times daily. 270 tablet 3   ibuprofen (ADVIL,MOTRIN) 200 MG tablet Take 400 mg by mouth 2 (two) times daily as needed (for pain.).     levothyroxine (SYNTHROID) 25 MCG tablet TAKE ONE TABLET (25MCG TOTAL) BY MOUTH DAILY BEFORE BREAKFAST. TAKE ALONG WITH 200MCG FOR TOTAL DOSE OF 225MCG. (Patient taking differently: Take 25 mcg by mouth daily before breakfast. Take with 200 mcg to equal 225 mcg daily) 90 tablet 0   levothyroxine (SYNTHROID, LEVOTHROID) 200 MCG tablet TAKE ONE TABLET BY MOUTH EVERY DAY BEFORE BREAKFAST (Patient taking differently: Take 200 mcg by mouth daily before breakfast. Take with 25 mcg to equal 225 mcg daily) 90 tablet 0   losartan (COZAAR) 100 MG tablet TAKE ONE TABLET (100MG  TOTAL) BY MOUTH DAILY 7 tablet 0   metFORMIN (GLUCOPHAGE) 500 MG tablet Take 1 tablet (500 mg total) by mouth 2 (two) times daily with breakfast and lunch. 60 tablet 0   MITIGARE 0.6 MG CAPS Take 0.6 mg by mouth 2 (two) times daily as needed (gout flare).     omega-3 acid ethyl esters (LOVAZA) 1 g capsule TAKE ONE CAPSULE BY MOUTH EVERY 12 HOURS 7 capsule 0   OVER THE COUNTER MEDICATION Apply 1 application topically daily as needed (rash). Terrasil cream     OVER THE COUNTER MEDICATION Apply 1 application topically daily as needed (itching). E45 itch relief cream     sodium bicarbonate 650 MG tablet Take 650 mg by mouth 3 (three) times daily.     tamsulosin (FLOMAX) 0.4 MG CAPS capsule TAKE ONE (1) CAPSULE BY MOUTH TWICE A DAY. (EVERY 12 HOURS.) 180 capsule 11   triamcinolone ointment (KENALOG) 0.5 % Apply 1 application topically 2 (two) times daily as needed. 30 g 0   No current facility-administered medications for this visit.    Allergies:   Meat extract, Lipitor [atorvastatin], Simvastatin, and Allopurinol    Social History:  The patient  reports that he quit  smoking about 17 years ago. His smoking use included cigarettes. He has a 60.00 pack-year smoking history. He has never used smokeless tobacco. He reports current alcohol use. He reports that  he does not use drugs.   Family History:  The patient's family history includes COPD in his father; Colon polyps in his mother; Healthy in his daughter and another family member; Heart disease in his father; High Cholesterol in his father; Hypertension in his mother; Thyroid disease in his daughter.    ROS:  General:no colds or fevers, no weight changes Skin:no rashes or ulcers HEENT:no blurred vision, no congestion CV:see HPI PUL:see HPI GI:no diarrhea constipation or melena, no indigestion GU:no hematuria, no dysuria MS:no joint pain, no claudication Neuro:no syncope, no lightheadedness Endo:+ diabetes, + thyroid disease  Wt Readings from Last 3 Encounters:  09/11/21 291 lb 3.2 oz (132.1 kg)  09/10/21 294 lb (133.4 kg)  08/02/21 294 lb (133.4 kg)     PHYSICAL EXAM: VS:  BP 140/80   Pulse 74   Ht 5\' 10"  (1.778 m)   Wt 291 lb 3.2 oz (132.1 kg)   SpO2 95%   BMI 41.78 kg/m  , BMI Body mass index is 41.78 kg/m. General:Pleasant affect, NAD Skin:Warm and dry, brisk capillary refill HEENT:normocephalic, sclera clear, mucus membranes moist Neck:supple, no JVD, no bruits  Heart:S1S2 RRR without murmur, gallup, rub or click Lungs:clear without rales, rhonchi, or wheezes LAG:TXMI, non tender, + BS, do not palpate liver spleen or masses Ext:no lower ext edema, 2+ pedal pulses, 2+ radial pulses Neuro:alert and oriented, MAE, follows commands, + facial symmetry    EKG:  EKG is not ordered today.    Recent Labs: 04/30/2021: ALT 31; BUN 30; Creatinine, Ser 1.96; Potassium 4.6; Sodium 138 06/07/2021: Hemoglobin 13.4; Platelets 304    Lipid Panel    Component Value Date/Time   CHOL 219 (H) 04/16/2020 1516   TRIG 440 (H) 04/16/2020 1516   HDL 51 04/16/2020 1516   CHOLHDL 3.7 03/23/2018 0957    LDLCALC 95 04/16/2020 1516   LDLCALC 94 03/23/2018 0957     Lipids 04/11/21 HDL 43, LDL 71, TG 275 PSA 1.40  Other studies Reviewed: Additional studies/ records that were reviewed today include: . Cardiac cath 2019  Mid LM to Dist LM lesion is 20% stenosed. Prox Cx (Zomax Study DES STENT) is 20% stenosed. The left ventricular systolic function is normal. The left ventricular ejection fraction is 55-65% by visual estimate. LV end diastolic pressure is moderately elevated.   Widely patent circumflex stent with minimal disease elsewhere.   Moderately elevated LVEDP   Plan: Discharge home after bed rest. Anticipate  medication adjustments with antihypertensives and possibly diuretic.    Echo 01/2017  Study Conclusions   - Left ventricle: The cavity size was normal. Wall thickness was    increased in a pattern of mild LVH. Systolic function was normal.    The estimated ejection fraction was in the range of 60% to 65%.    Wall motion was normal; there were no regional wall motion    abnormalities. Doppler parameters are consistent with abnormal    left ventricular relaxation (grade 1 diastolic dysfunction).  - Aortic valve: Probably trileaflet. Mean gradient (S): 3 mm Hg.  - Tricuspid valve: There was trivial regurgitation.  - Pulmonary arteries: Systolic pressure could not be accurately    estimated.  - Pericardium, extracardiac: A prominent pericardial fat pad was    present.   Impressions:   - Mild LVH with LVEF 60-65% and grade 1 diastolic dysfunction.    Trivial tricuspid regurgitation, unable to assess PASP. Prominent    pericardial fat pad.   06/2020 30 day event monitor  30 day event monitor Min HR 52, Max HR 114, Avg HR 68 Reported symptoms correlate with sinus rhythm, rare PVCs No significant arrhythmias  ASSESSMENT AND PLAN:  1.  Palpitations and slow HR by pulse 0x.  Plan to wear 30 day event monitor to see if we can capture the symptoms.  If unable to  capture I did discus the APP Kardio to monitor HR if symptoms occur or apple watch to monitor.  But for now event monitor is ordered.   With slow HR a possibility will hold of dilt until event monitor seen.   2.  CAD with hx of PCI - no angina, continue meds and recommend exercise.  3.  Hypothyroid, no TSH yet but to see endocrinologist Dr. Loanne Drilling  in Future and will check   4.  OSA wears CPAP nasal pillow nightly.  Agrees to referral to pulmonary for new device and possible need for home sleep study.  Will defer to pulmonary  5.  Rash on legs, he does not sound like claudication, would recommend dermatologist - steroid cream is helping.  May need lower ext  dopplers in future  6.  Flu vaccine and covid vaccines up to date  7. HLD well controlled review of labs from PCP.  Continue zetia      Follow up with Dr. Harl Bowie 5-6 weeks after monitor is placed.  Current medicines are reviewed with the patient today.  The patient Has no concerns regarding medicines.  The following changes have been made:  See above Labs/ tests ordered today include:see above  Disposition:   FU:  see above  Signed, Cecilie Kicks, NP  09/11/2021 1:27 PM    [facility] [address], [place], [place] Phone: [phone]; Fax: [phone]

## 2021-09-11 ENCOUNTER — Encounter: Payer: Self-pay | Admitting: Cardiology

## 2021-09-11 ENCOUNTER — Ambulatory Visit: Payer: Medicare HMO | Admitting: Cardiology

## 2021-09-11 VITALS — BP 140/80 | HR 74 | Ht 70.0 in | Wt 291.2 lb

## 2021-09-11 DIAGNOSIS — R21 Rash and other nonspecific skin eruption: Secondary | ICD-10-CM | POA: Diagnosis not present

## 2021-09-11 DIAGNOSIS — I10 Essential (primary) hypertension: Secondary | ICD-10-CM | POA: Diagnosis not present

## 2021-09-11 DIAGNOSIS — R002 Palpitations: Secondary | ICD-10-CM

## 2021-09-11 DIAGNOSIS — G4730 Sleep apnea, unspecified: Secondary | ICD-10-CM

## 2021-09-11 DIAGNOSIS — E782 Mixed hyperlipidemia: Secondary | ICD-10-CM | POA: Diagnosis not present

## 2021-09-11 DIAGNOSIS — I2510 Atherosclerotic heart disease of native coronary artery without angina pectoris: Secondary | ICD-10-CM

## 2021-09-11 DIAGNOSIS — G4733 Obstructive sleep apnea (adult) (pediatric): Secondary | ICD-10-CM | POA: Diagnosis not present

## 2021-09-11 LAB — SEDIMENTATION RATE: Sed Rate: 79 mm/h — ABNORMAL HIGH (ref 0–20)

## 2021-09-11 LAB — URIC ACID: Uric Acid, Serum: 7.9 mg/dL (ref 4.0–8.0)

## 2021-09-11 NOTE — Patient Instructions (Addendum)
Medication Instructions:  Your physician recommends that you continue on your current medications as directed. Please refer to the Current Medication list given to you today.  *If you need a refill on your cardiac medications before your next appointment, please call your pharmacy*   Lab Work: None If you have labs (blood work) drawn today and your tests are completely normal, you will receive your results only by: [place] (if you have MyChart) OR A paper copy in the mail If you have any lab test that is abnormal or we need to change your treatment, we will call you to review the results.   Testing/Procedures: [place] [place]- Long Term Monitor Instructions    AFTER THANKSGIVING  Your physician has requested you wear your ZIO patch monitor___30____days.   This is a single patch monitor.  [employer] supplies one patch monitor per enrollment.  Additional stickers are not available.   Please do not apply patch if you will be having a Nuclear Stress Test, Echocardiogram, Cardiac CT, MRI, or Chest Xray during the time frame you would be wearing the monitor. The patch cannot be worn during these tests.  You cannot remove and re-apply the ZIO XT patch monitor.   Your ZIO patch monitor will be sent USPS Priority mail from [facility] directly to your home address. The monitor may also be mailed to a PO BOX if home delivery is not available.   It may take 3-5 days to receive your monitor after you have been enrolled.   Once you have received you monitor, please review enclosed instructions.  Your monitor has already been registered assigning a specific monitor serial # to you.   Applying the monitor   Shave hair from upper left chest.   Hold abrader disc by orange tab.  Rub abrader in 40 strokes over left upper chest as indicated in your monitor instructions.   Clean area with 4 enclosed alcohol pads .  Use all pads to assure are is cleaned thoroughly.  Let dry.   Apply patch as  indicated in monitor instructions.  Patch will be place under collarbone on left side of chest with arrow pointing upward.   Rub patch adhesive wings for 2 minutes.Remove white label marked "1".  Remove white label marked "2".  Rub patch adhesive wings for 2 additional minutes.   While looking in a mirror, press and release button in center of patch.  A small green light will flash 3-4 times .  This will be your only indicator the monitor has been turned on.     Do not shower for the first 24 hours.  You may shower after the first 24 hours.   Press button if you feel a symptom. You will hear a small click.  Record Date, Time and Symptom in the Patient Log Book.   When you are ready to remove patch, follow instructions on last 2 pages of Patient Log Book.  Stick patch monitor onto last page of Patient Log Book.   Place Patient Log Book in [place] box.  Use locking tab on box and tape box closed securely.  The [employer] has [employer] on it.  Please place in mailbox as soon as possible.  Your physician should have your test results approximately 7 days after the monitor has been mailed back to [facility].   Call [place] at [phone] if you have questions regarding your ZIO XT patch monitor.  Call them immediately if you see  an orange light blinking on your monitor.   If your monitor falls off in less than 4 days contact our Monitor department at [phone].  If your monitor becomes loose or falls off after 4 days call [employer] at [phone] for suggestions on securing your monitor.     Follow-Up: At [facility], you and your health needs are our priority.  As part of our continuing mission to provide you with exceptional heart care, we have created designated Provider Care Teams.  These Care Teams include your primary Cardiologist (physician) and Advanced Practice Providers (APPs -  Physician Assistants and Nurse Practitioners) who all work  together to provide you with the care you need, when you need it.  We recommend signing up for the patient portal called "MyChart".  Sign up information is provided on this After Visit Summary.  MyChart is used to connect with patients for Virtual Visits (Telemedicine).  Patients are able to view lab/test results, encounter notes, upcoming appointments, etc.  Non-urgent messages can be sent to your provider as well.   To learn more about what you can do with MyChart, go to [email or web address].    Your next appointment:   6-8 week(s)  The format for your next appointment:   In Person  Provider:   You may see Carlyle Dolly, MD or one of the following Advanced Practice Providers on your designated Care Team:   [place], PA-C  Ermalinda Barrios, [place]

## 2021-09-13 DIAGNOSIS — N041 Nephrotic syndrome with focal and segmental glomerular lesions: Secondary | ICD-10-CM | POA: Diagnosis not present

## 2021-09-13 DIAGNOSIS — E1122 Type 2 diabetes mellitus with diabetic chronic kidney disease: Secondary | ICD-10-CM | POA: Diagnosis not present

## 2021-09-13 DIAGNOSIS — N189 Chronic kidney disease, unspecified: Secondary | ICD-10-CM | POA: Diagnosis not present

## 2021-09-13 DIAGNOSIS — R808 Other proteinuria: Secondary | ICD-10-CM | POA: Diagnosis not present

## 2021-09-13 DIAGNOSIS — I129 Hypertensive chronic kidney disease with stage 1 through stage 4 chronic kidney disease, or unspecified chronic kidney disease: Secondary | ICD-10-CM | POA: Diagnosis not present

## 2021-09-13 DIAGNOSIS — I5032 Chronic diastolic (congestive) heart failure: Secondary | ICD-10-CM | POA: Diagnosis not present

## 2021-09-18 MED ORDER — FEBUXOSTAT 80 MG PO TABS: 80.0000 mg | ORAL_TABLET | Freq: Every day | ORAL | 2 refills | Status: DC

## 2021-09-18 NOTE — Addendum Note (Signed)
Addended by: Collier Salina on: 09/18/2021 01:47 PM   Modules accepted: Orders

## 2021-09-18 NOTE — Progress Notes (Signed)
Lab result shows uric acid is above goal at 7.9 so would recommend increasing the uloric to 80 mg daily dose. He can take the existing tablets 2x per day until finished. He is increasing lasix for edema per nephrology note and worsening kidney function these can worsen uric acid so recommend we go ahead with this increase.

## 2021-09-24 ENCOUNTER — Ambulatory Visit: Payer: Medicare HMO | Admitting: Endocrinology

## 2021-10-04 DIAGNOSIS — R002 Palpitations: Secondary | ICD-10-CM | POA: Diagnosis not present

## 2021-10-16 ENCOUNTER — Other Ambulatory Visit (HOSPITAL_COMMUNITY): Payer: Self-pay | Admitting: Internal Medicine

## 2021-10-16 ENCOUNTER — Other Ambulatory Visit: Payer: Self-pay | Admitting: Internal Medicine

## 2021-10-16 ENCOUNTER — Encounter: Payer: Self-pay | Admitting: Internal Medicine

## 2021-10-16 DIAGNOSIS — I10 Essential (primary) hypertension: Secondary | ICD-10-CM | POA: Diagnosis not present

## 2021-10-16 DIAGNOSIS — R109 Unspecified abdominal pain: Secondary | ICD-10-CM

## 2021-10-16 DIAGNOSIS — E1129 Type 2 diabetes mellitus with other diabetic kidney complication: Secondary | ICD-10-CM | POA: Diagnosis not present

## 2021-10-16 DIAGNOSIS — E063 Autoimmune thyroiditis: Secondary | ICD-10-CM | POA: Diagnosis not present

## 2021-10-16 DIAGNOSIS — Z6835 Body mass index (BMI) 35.0-35.9, adult: Secondary | ICD-10-CM | POA: Diagnosis not present

## 2021-10-16 DIAGNOSIS — E6609 Other obesity due to excess calories: Secondary | ICD-10-CM | POA: Diagnosis not present

## 2021-10-25 ENCOUNTER — Other Ambulatory Visit: Payer: Self-pay | Admitting: Cardiology

## 2021-10-25 ENCOUNTER — Ambulatory Visit (HOSPITAL_COMMUNITY)
Admission: RE | Admit: 2021-10-25 | Discharge: 2021-10-25 | Disposition: A | Discharge status: Home / Self Care | Payer: Medicare HMO | Source: Ambulatory Visit | Attending: Internal Medicine | Admitting: Internal Medicine

## 2021-10-25 ENCOUNTER — Other Ambulatory Visit: Payer: Self-pay

## 2021-10-25 DIAGNOSIS — R109 Unspecified abdominal pain: Secondary | ICD-10-CM | POA: Diagnosis not present

## 2021-10-25 DIAGNOSIS — K7689 Other specified diseases of liver: Secondary | ICD-10-CM | POA: Diagnosis not present

## 2021-10-25 DIAGNOSIS — I714 Abdominal aortic aneurysm, without rupture, unspecified: Secondary | ICD-10-CM | POA: Diagnosis not present

## 2021-11-05 DIAGNOSIS — I129 Hypertensive chronic kidney disease with stage 1 through stage 4 chronic kidney disease, or unspecified chronic kidney disease: Secondary | ICD-10-CM | POA: Diagnosis not present

## 2021-11-05 DIAGNOSIS — E1122 Type 2 diabetes mellitus with diabetic chronic kidney disease: Secondary | ICD-10-CM | POA: Diagnosis not present

## 2021-11-05 DIAGNOSIS — E538 Deficiency of other specified B group vitamins: Secondary | ICD-10-CM | POA: Diagnosis not present

## 2021-11-05 DIAGNOSIS — R5383 Other fatigue: Secondary | ICD-10-CM | POA: Diagnosis not present

## 2021-11-05 DIAGNOSIS — Z79899 Other long term (current) drug therapy: Secondary | ICD-10-CM | POA: Diagnosis not present

## 2021-11-05 DIAGNOSIS — N041 Nephrotic syndrome with focal and segmental glomerular lesions: Secondary | ICD-10-CM | POA: Diagnosis not present

## 2021-11-05 DIAGNOSIS — E559 Vitamin D deficiency, unspecified: Secondary | ICD-10-CM | POA: Diagnosis not present

## 2021-11-05 DIAGNOSIS — N189 Chronic kidney disease, unspecified: Secondary | ICD-10-CM | POA: Diagnosis not present

## 2021-11-05 DIAGNOSIS — R808 Other proteinuria: Secondary | ICD-10-CM | POA: Diagnosis not present

## 2021-11-05 DIAGNOSIS — D72829 Elevated white blood cell count, unspecified: Secondary | ICD-10-CM | POA: Diagnosis not present

## 2021-11-05 DIAGNOSIS — D509 Iron deficiency anemia, unspecified: Secondary | ICD-10-CM | POA: Diagnosis not present

## 2021-11-05 DIAGNOSIS — I5032 Chronic diastolic (congestive) heart failure: Secondary | ICD-10-CM | POA: Diagnosis not present

## 2021-11-05 DIAGNOSIS — D508 Other iron deficiency anemias: Secondary | ICD-10-CM | POA: Diagnosis not present

## 2021-11-07 DIAGNOSIS — E1122 Type 2 diabetes mellitus with diabetic chronic kidney disease: Secondary | ICD-10-CM | POA: Diagnosis not present

## 2021-11-07 DIAGNOSIS — I5032 Chronic diastolic (congestive) heart failure: Secondary | ICD-10-CM | POA: Diagnosis not present

## 2021-11-07 DIAGNOSIS — D631 Anemia in chronic kidney disease: Secondary | ICD-10-CM | POA: Diagnosis not present

## 2021-11-07 DIAGNOSIS — R808 Other proteinuria: Secondary | ICD-10-CM | POA: Diagnosis not present

## 2021-11-07 DIAGNOSIS — N041 Nephrotic syndrome with focal and segmental glomerular lesions: Secondary | ICD-10-CM | POA: Diagnosis not present

## 2021-11-07 DIAGNOSIS — I129 Hypertensive chronic kidney disease with stage 1 through stage 4 chronic kidney disease, or unspecified chronic kidney disease: Secondary | ICD-10-CM | POA: Diagnosis not present

## 2021-11-07 DIAGNOSIS — N189 Chronic kidney disease, unspecified: Secondary | ICD-10-CM | POA: Diagnosis not present

## 2021-11-15 ENCOUNTER — Encounter: Payer: Self-pay | Admitting: Cardiology

## 2021-11-15 ENCOUNTER — Ambulatory Visit (INDEPENDENT_AMBULATORY_CARE_PROVIDER_SITE_OTHER): Payer: Medicare HMO | Admitting: Cardiology

## 2021-11-15 ENCOUNTER — Other Ambulatory Visit: Payer: Self-pay

## 2021-11-15 VITALS — BP 110/60 | HR 76 | Ht 70.5 in | Wt 279.2 lb

## 2021-11-15 DIAGNOSIS — R002 Palpitations: Secondary | ICD-10-CM

## 2021-11-15 DIAGNOSIS — I471 Supraventricular tachycardia: Secondary | ICD-10-CM | POA: Diagnosis not present

## 2021-11-15 MED ORDER — DILTIAZEM HCL 30 MG PO TABS: 30.0000 mg | ORAL_TABLET | Freq: Two times a day (BID) | ORAL | 3 refills | Status: DC

## 2021-11-15 NOTE — Progress Notes (Signed)
Clinical Summary Brandon Warren is a 71 y.o.male seen today for follow up of the following medical problems. This is a focused visit on recent symptoms of palpitations.         1. Palpitations - feeling of heart racing at times, associated with pressure upper midchest.  - HR up to 140 with pulse ox. Can last up to 10 min. Some lightheadedness. Sudden onset and termination - few cups of coffee a daily, rare EtOH.    - 06/2020 event monitor benign, just rare PVCs - one episode since last visit, HR was up to 155 for about 2 minutes.    - recent increase in elevated HRs. Rates up to 150s at times at night with associated palpitations - reports some recent issues with his thryoid, seeing his endocrinologist next week with repeat labs - recent significant stress from health issues with his wife   - ongoing racing. HRs can get up to 150s to 160s. Symptoms last up to 80 minutes - can have low HRs at times 30s to 40s by his dynamap  - limiting caffine.  - he wore a heart monitor recently but results are not in epic, we are calling preventice.           SJ: wife with recent complex hernia surgery, complicated by sepsis and needed repeat surgery. She is doing well now.    Past Medical History:  Diagnosis Date   Arthritis    Back pain    BPH (benign prostatic hyperplasia)    CAD S/P percutaneous coronary angioplasty    a. 2005: PCI OM - Zomax study stent DES  2.5 x16 mm. b. cath in 02/2018 showing 20% ISR of LCx stent and 20% stenosis along mid-distal LM.    Controlled type 2 diabetes with neuropathy (HCC)    Edema of both lower extremities    Food allergy    Gout    Hypercholesterolemia    Hypertension    Hypothyroidism    Kidney disease    MI (myocardial infarction) ([place])    in his 42s   Sleep apnea    SOB (shortness of breath)      Allergies  Allergen Reactions   Meat Extract Diarrhea, Nausea Only and Other (See Comments)    alphagal reaction   Lipitor [Atorvastatin]  Other (See Comments)    Muscle cramps   Simvastatin Other (See Comments)    Muscle cramps    Allopurinol Rash     Current Outpatient Medications  Medication Sig Dispense Refill   acetaminophen (TYLENOL) 500 MG tablet Take 500 mg by mouth 2 (two) times daily as needed (for pain.).     ALPRAZolam (XANAX) 0.5 MG tablet Take 0.5 mg by mouth daily as needed for anxiety.     amLODipine (NORVASC) 5 MG tablet Take 1 tablet (5 mg total) by mouth in the morning and at bedtime. 180 tablet 3   aspirin EC 81 MG tablet Take 81 mg by mouth every evening.     bromocriptine (PARLODEL) 2.5 MG tablet Take 0.5 tablets (1.25 mg total) by mouth at bedtime. 45 tablet 3   carvedilol (COREG) 25 MG tablet TAKE ONE AND ONE-HALF TABLET (37.5MG  TOTAL) BY MOUTH TWO TIMES A DAY 270 tablet 3   Cholecalciferol (VITAMIN D3) 5000 units CAPS Take 1 capsule (5,000 Units total) by mouth daily. 90 capsule 0   diazepam (VALIUM) 10 MG tablet Take 10 mg by mouth daily as needed (muscle spasms).  Dulaglutide (TRULICITY) 4.5 WI/0.9BD SOPN Inject 4.5 mg as directed once a week. 6 mL 3   EPINEPHrine 0.3 mg/0.3 mL IJ SOAJ injection Inject 0.3 mg into the muscle as needed for anaphylaxis.     ezetimibe (ZETIA) 10 MG tablet Take 10 mg by mouth at bedtime.     Febuxostat 80 MG TABS Take 1 tablet (80 mg total) by mouth daily. 30 tablet 2   ferrous sulfate 325 (65 FE) MG tablet Take 325 mg by mouth 3 (three) times daily.     finasteride (PROSCAR) 5 MG tablet Take 5 mg by mouth daily.      furosemide (LASIX) 20 MG tablet Take 20 mg daily if you have more than 3 lbs weight gain in 24 hrs (Patient taking differently: Take 20 mg by mouth 2 (two) times daily.) 30 tablet 3   hydrALAZINE (APRESOLINE) 100 MG tablet Take 1 tablet (100 mg total) by mouth 3 (three) times daily. 270 tablet 3   ibuprofen (ADVIL,MOTRIN) 200 MG tablet Take 400 mg by mouth 2 (two) times daily as needed (for pain.).     levothyroxine (SYNTHROID) 25 MCG tablet TAKE ONE  TABLET (25MCG TOTAL) BY MOUTH DAILY BEFORE BREAKFAST. TAKE ALONG WITH 200MCG FOR TOTAL DOSE OF 225MCG. (Patient taking differently: Take 25 mcg by mouth daily before breakfast. Take with 200 mcg to equal 225 mcg daily) 90 tablet 0   levothyroxine (SYNTHROID, LEVOTHROID) 200 MCG tablet TAKE ONE TABLET BY MOUTH EVERY DAY BEFORE BREAKFAST (Patient taking differently: Take 200 mcg by mouth daily before breakfast. Take with 25 mcg to equal 225 mcg daily) 90 tablet 0   losartan (COZAAR) 100 MG tablet TAKE ONE TABLET (100MG  TOTAL) BY MOUTH DAILY 7 tablet 0   metFORMIN (GLUCOPHAGE) 500 MG tablet Take 1 tablet (500 mg total) by mouth 2 (two) times daily with breakfast and lunch. 60 tablet 0   MITIGARE 0.6 MG CAPS Take 0.6 mg by mouth 2 (two) times daily as needed (gout flare).     omega-3 acid ethyl esters (LOVAZA) 1 g capsule TAKE ONE CAPSULE BY MOUTH EVERY 12 HOURS 7 capsule 0   OVER THE COUNTER MEDICATION Apply 1 application topically daily as needed (rash). Terrasil cream     OVER THE COUNTER MEDICATION Apply 1 application topically daily as needed (itching). E45 itch relief cream     sodium bicarbonate 650 MG tablet Take 650 mg by mouth 3 (three) times daily.     tamsulosin (FLOMAX) 0.4 MG CAPS capsule TAKE ONE (1) CAPSULE BY MOUTH TWICE A DAY. (EVERY 12 HOURS.) 180 capsule 11   triamcinolone ointment (KENALOG) 0.5 % Apply 1 application topically 2 (two) times daily as needed. 30 g 0   No current facility-administered medications for this visit.     Past Surgical History:  Procedure Laterality Date   APPENDECTOMY     CATARACT EXTRACTION Left    COLONOSCOPY N/A 02/01/2014   Procedure: COLONOSCOPY;  Surgeon: Daneil Dolin, MD;  [facility];  Service: Endoscopy;  Laterality: N/A;  8:30   CORONARY ANGIOPLASTY WITH STENT PLACEMENT  2005   PCI- OM1 Zomax study stent 2.5 x16 mm.   LEFT HEART CATH AND CORONARY ANGIOGRAPHY N/A 03/15/2018   Procedure: LEFT HEART CATH AND CORONARY ANGIOGRAPHY;   Surgeon: Leonie Man, MD;  Location: [place] [facility];  Service: Cardiovascular;  Laterality: N/A;     Allergies  Allergen Reactions   Meat Extract Diarrhea, Nausea Only and Other (See Comments)  alphagal reaction   Lipitor [Atorvastatin] Other (See Comments)    Muscle cramps   Simvastatin Other (See Comments)    Muscle cramps    Allopurinol Rash      Family History  Problem Relation Age of Onset   Colon polyps Mother    Hypertension Mother    High Cholesterol Father    Heart disease Father    COPD Father    Thyroid disease Daughter    Healthy Daughter    Healthy Other    Colon cancer Neg Hx      Social History Mr. Tabora reports that he quit smoking about 17 years ago. His smoking use included cigarettes. He has a 60.00 pack-year smoking history. He has never used smokeless tobacco. Mr. Langdon reports current alcohol use.   Review of Systems CONSTITUTIONAL: No weight loss, fever, chills, weakness or fatigue.  HEENT: Eyes: No visual loss, blurred vision, double vision or yellow sclerae.No hearing loss, sneezing, congestion, runny nose or sore throat.  SKIN: No rash or itching.  CARDIOVASCULAR: per hpi RESPIRATORY: No shortness of breath, cough or sputum.  GASTROINTESTINAL: No anorexia, nausea, vomiting or diarrhea. No abdominal pain or blood.  GENITOURINARY: No burning on urination, no polyuria NEUROLOGICAL: No headache, dizziness, syncope, paralysis, ataxia, numbness or tingling in the extremities. No change in bowel or bladder control.  MUSCULOSKELETAL: No muscle, back pain, joint pain or stiffness.  LYMPHATICS: No enlarged nodes. No history of splenectomy.  PSYCHIATRIC: No history of depression or anxiety.  ENDOCRINOLOGIC: No reports of sweating, cold or heat intolerance. No polyuria or polydipsia.  Marland Kitchen   Physical Examination Today's Vitals   11/15/21 1503  BP: 110/60  Pulse: 76  SpO2: 96%  Weight: 279 lb 3.2 oz (126.6 kg)  Height: 5' 10.5" (1.791  m)   Body mass index is 39.49 kg/m.  Gen: resting comfortably, no acute distress HEENT: no scleral icterus, pupils equal round and reactive, no palptable cervical adenopathy,  CV: RRR, no m/r/g no jvd Resp: Clear to auscultation bilaterally GI: abdomen is soft, non-tender, non-distended, normal bowel sounds, no hepatosplenomegaly MSK: extremities are warm, no edema.  Skin: warm, no rash Neuro:  no focal deficits Psych: appropriate affect      Assessment and Plan   1. Palpitations - were able to get report from preventice, recent monitor with episodes of SVT, no significant brady episodes - he is on coreg 37.5mg  bid. Will d/c norvasc, start diltiazem 30mg  bid and monitor symptoms.      Arnoldo Lenis, M.D..

## 2021-11-15 NOTE — Patient Instructions (Signed)
Medication Instructions:   STOP Amlodipine  START Diltiazem 30 mg twice a day  Follow-Up: 6 months    If you need a refill on your cardiac medications before your next appointment, please call your pharmacy.

## 2021-11-27 DIAGNOSIS — N184 Chronic kidney disease, stage 4 (severe): Secondary | ICD-10-CM | POA: Diagnosis not present

## 2021-11-28 DIAGNOSIS — Z91018 Allergy to other foods: Secondary | ICD-10-CM | POA: Diagnosis not present

## 2021-11-28 DIAGNOSIS — Z6841 Body Mass Index (BMI) 40.0 and over, adult: Secondary | ICD-10-CM | POA: Diagnosis not present

## 2021-11-28 DIAGNOSIS — F17200 Nicotine dependence, unspecified, uncomplicated: Secondary | ICD-10-CM | POA: Diagnosis not present

## 2021-11-28 DIAGNOSIS — E039 Hypothyroidism, unspecified: Secondary | ICD-10-CM | POA: Diagnosis not present

## 2021-11-28 DIAGNOSIS — E1122 Type 2 diabetes mellitus with diabetic chronic kidney disease: Secondary | ICD-10-CM | POA: Diagnosis not present

## 2021-11-28 DIAGNOSIS — N184 Chronic kidney disease, stage 4 (severe): Secondary | ICD-10-CM | POA: Diagnosis not present

## 2021-11-28 DIAGNOSIS — E785 Hyperlipidemia, unspecified: Secondary | ICD-10-CM | POA: Diagnosis not present

## 2021-11-28 DIAGNOSIS — E669 Obesity, unspecified: Secondary | ICD-10-CM | POA: Diagnosis not present

## 2021-11-28 DIAGNOSIS — G4733 Obstructive sleep apnea (adult) (pediatric): Secondary | ICD-10-CM | POA: Diagnosis not present

## 2021-11-28 DIAGNOSIS — E119 Type 2 diabetes mellitus without complications: Secondary | ICD-10-CM | POA: Diagnosis not present

## 2021-11-28 DIAGNOSIS — I10 Essential (primary) hypertension: Secondary | ICD-10-CM | POA: Diagnosis not present

## 2021-11-28 DIAGNOSIS — I129 Hypertensive chronic kidney disease with stage 1 through stage 4 chronic kidney disease, or unspecified chronic kidney disease: Secondary | ICD-10-CM | POA: Diagnosis not present

## 2021-11-28 DIAGNOSIS — M109 Gout, unspecified: Secondary | ICD-10-CM | POA: Diagnosis not present

## 2021-12-11 ENCOUNTER — Encounter: Payer: Self-pay | Admitting: Internal Medicine

## 2021-12-11 ENCOUNTER — Other Ambulatory Visit: Payer: Self-pay

## 2021-12-11 ENCOUNTER — Ambulatory Visit: Payer: Medicare HMO | Admitting: Internal Medicine

## 2021-12-11 VITALS — BP 127/78 | HR 71 | Resp 12 | Ht 69.0 in | Wt 273.2 lb

## 2021-12-11 DIAGNOSIS — R21 Rash and other nonspecific skin eruption: Secondary | ICD-10-CM | POA: Diagnosis not present

## 2021-12-11 DIAGNOSIS — M25512 Pain in left shoulder: Secondary | ICD-10-CM | POA: Diagnosis not present

## 2021-12-11 DIAGNOSIS — G8929 Other chronic pain: Secondary | ICD-10-CM | POA: Diagnosis not present

## 2021-12-11 DIAGNOSIS — Z6841 Body Mass Index (BMI) 40.0 and over, adult: Secondary | ICD-10-CM

## 2021-12-11 DIAGNOSIS — M25511 Pain in right shoulder: Secondary | ICD-10-CM

## 2021-12-11 DIAGNOSIS — R700 Elevated erythrocyte sedimentation rate: Secondary | ICD-10-CM | POA: Diagnosis not present

## 2021-12-11 DIAGNOSIS — M1040 Other secondary gout, unspecified site: Secondary | ICD-10-CM | POA: Diagnosis not present

## 2021-12-11 LAB — URIC ACID: Uric Acid, Serum: 7.1 mg/dL (ref 4.0–8.0)

## 2021-12-11 NOTE — Progress Notes (Signed)
Office Visit Note  Patient: Brandon Warren             Date of Birth: [birth year]           MRN: [id number]             PCP: Redmond School, MD Referring: Redmond School, MD Visit Date: 12/11/2021   Subjective:   History of Present Illness: Brandon Warren is a 71 y.o. male here for follow up for gout on uloric increased from 40 mg daily to 80 mg daily after last visit. He has not suffered any major gout flare ups since that time. He saw [facility] for additional evaluation recommended by Dr. 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 He is seeing bariatric clinic current medically managed weight loss with a ketogenic diet and ozempic treatment. His right knee is painful after his last long distance drive but somewhat improving. Right shoulder pain is bothering him with radiation down the arm from certain positions.   Previous HPI 09/10/21 Brandon Warren is a 71 y.o. 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 He had interval events with stopping azathioprine based on side effect and switching to uloric. He has also followed up with Dr. Dr. Theador Hawthorne for his chronic renal disease. He has not suffered any significant gout flare since our last visit. He has experienced increase in lower extremity rashes with redness, itching, and swelling on the lower shins and feet. He was using halobetasol up to 3 times daily but was running out of this medication so stopped using it regularly. He is also curious about getting the COVID booster dose.   Previous HPI 02/11/21 Brandon Warren is a 71 y.o. male with a history of hypertension, hypothyroidism, coronary artery disease, type 2 diabetes, hyperprolactinemia, and CKD stage IIIb here for evaluation of elevated sedimentation rate and joint pain in multiple sites.  He describes symptoms starting about 2 years ago initially with pain and inflammation in the left great toe  originally diagnosed as gout.  He has inflammatory episodes alternating between the right and left feet initially but after several months started to develop pain and swelling in other areas.  He had right knee inflammation with associated warmth and had the aspiration apparently showing clear fluid.  The symptoms have recurred multiple times he describes it as swelling and warmth but pain is more radiating proximal and distal to the joint itself.  He had an episode with this kind of inflammation in the right elbow.  He had left shoulder pain with work-up was attributed to impingement syndrome symptoms have partially improved.  He now describes increasing symptoms in the right shoulder.  He does have radiation of numbness to the second and third digits on the right hand.  This sometimes causes weakness or dropping items.  He states individual episodes of joint swelling last 3 to 4 days do not seem to improve any more quickly when he takes colchicine then when he does not.  He is not on any maintenance urate lowering therapy.   Besides the joint pains he is also noticed developing small red lesions on both anterior shins. No spontaneous changes and partial loss of the left great toenail.  He has increased redness and swelling of the feet and a little bit above the right ankle.  He does have diabetic neuropathy he attributes this too but the increasing swelling is newer.  Review of Systems  Constitutional:  Positive for weight loss.  Musculoskeletal:  Positive for joint pain, joint pain and morning stiffness.  Skin:  Positive for rash.   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   Dysuria 02/19/2021   Elevated sedimentation rate 02/11/2021   Bilateral shoulder pain 02/11/2021   Bilateral foot pain 02/11/2021   Bilateral hand pain 02/11/2021   CKD stage 4 secondary to hypertension ([place])  04/17/2020   Diabetes mellitus type 2 in obese ([place]) 05/21/2017   Hyperprolactinemia ([place]) 05/12/2017   Chest pain 04/20/2016   Encounter for screening colonoscopy 01/24/2014   Pseudophakia, left eye 06/08/2013   Status post laser cataract surgery of left eye 05/25/2013   Bradycardia 05/20/2013   Dizziness 05/20/2013   Coronary artery disease involving native coronary artery of native heart with angina pectoris ([place]) 05/19/2013   Essential hypertension, benign 05/19/2013   Hypertriglyceridemia 05/19/2013   Hypothyroidism 05/19/2013   Morbid obesity ([place]) 05/19/2013   Amblyopia of right eye 05/02/2013   Combined form of senile cataract 05/02/2013    Past Medical History:  Diagnosis Date   Arthritis    Back pain    BPH (benign prostatic hyperplasia)    CAD S/P percutaneous coronary angioplasty    a. 2005: PCI OM - Zomax study stent DES  2.5 x16 mm. b. cath in 02/2018 showing 20% ISR of LCx stent and 20% stenosis along mid-distal LM.    Controlled type 2 diabetes with neuropathy (HCC)    Edema of both lower extremities    Food allergy    Gout    Hypercholesterolemia    Hypertension    Hypothyroidism    Kidney disease    MI (myocardial infarction) ([place])    in his 42s   Sleep apnea    SOB (shortness of breath)     Family History  Problem Relation Age of Onset   Colon polyps Mother    Hypertension Mother    High Cholesterol Father    Heart disease Father    COPD Father    Thyroid disease Daughter    Healthy Daughter    Healthy Other    Colon cancer Neg Hx    Past Surgical History:  Procedure Laterality Date   APPENDECTOMY     CATARACT EXTRACTION Left    COLONOSCOPY N/A 02/01/2014   Procedure: COLONOSCOPY;  Surgeon: Daneil Dolin, MD;  [facility];  Service: Endoscopy;  Laterality: N/A;  8:30   CORONARY ANGIOPLASTY WITH STENT PLACEMENT  2005   PCI- OM1 Zomax study stent 2.5 x16 mm.   LEFT HEART CATH AND CORONARY ANGIOGRAPHY N/A 03/15/2018   Procedure: LEFT  HEART CATH AND CORONARY ANGIOGRAPHY;  Surgeon: Leonie Man, MD;  Location: [place] [facility];  Service: Cardiovascular;  Laterality: N/A;   Social History   Social History Narrative   Not on file    There is no immunization history on file for this patient.   Objective: Vital Signs: BP 127/78 (BP Location: Left Arm, Patient Position: Sitting, Cuff Size: Large)    Pulse 71    Resp 12    Ht 5\' 9"  (1.753 m)    Wt 273 lb 3.2 oz (123.9 kg)    BMI 40.34 kg/m    Physical Exam Constitutional:      Appearance: He is obese.  Cardiovascular:     Rate and  Rhythm: Normal rate and regular rhythm.  Pulmonary:     Effort: Pulmonary effort is normal.     Breath sounds: Normal breath sounds.  Musculoskeletal:     Right lower leg: No edema.     Left lower leg: No edema.  Skin:    General: Skin is warm and dry.     Comments: Rash on anterior left shin and ankle  Neurological:     Mental Status: He is alert.  Psychiatric:        Mood and Affect: Mood normal.     Musculoskeletal Exam:  Neck full ROM no tenderness Right shoulder posterior pain provoked with full extension and abduction, minimal radiation, no focal tenderness to pressure Elbows full ROM no tenderness or swelling Wrists full ROM no tenderness or swelling Fingers full ROM no tenderness or swelling Knees full ROM no tenderness or swelling Ankles full ROM no tenderness or swelling   Investigation: No additional findings.  Imaging: No results found.  Recent Labs: Lab Results  Component Value Date   WBC 12.8 (H) 06/07/2021   HGB 13.4 06/07/2021   PLT 304 06/07/2021   NA 138 04/30/2021   K 4.6 04/30/2021   CL 101 04/30/2021   CO2 18 (L) 04/30/2021   GLUCOSE 124 (H) 04/30/2021   BUN 30 (H) 04/30/2021   CREATININE 1.96 (H) 04/30/2021   BILITOT 0.3 04/30/2021   ALKPHOS 60 04/30/2021   AST 25 04/30/2021   ALT 31 04/30/2021   PROT 7.4 04/30/2021   ALBUMIN 4.4 04/30/2021   CALCIUM 9.6 04/30/2021   GFRAA 42 (L)  04/16/2020    Speciality Comments: No specialty comments available.  Procedures:  No procedures performed Allergies: Meat extract, Lipitor [atorvastatin], Simvastatin, and Allopurinol   Assessment / Plan:     Visit Diagnoses: Secondary gout - Plan: Uric acid  Gout symptoms appear well controlled on uloric 80 mg daily, hyperurecmia I suspect mostly related to renal function. Checking uric acid level see if this is at goal on incraesed dose. Can continue mitigare only as PRN for flare.  Morbid obesity ([place])  He is working on weight reduction with recent nephrologist recommendation needed to prevent CKD progression. Now on ozempic as well as seeing bariatic clinic.  Rash and other nonspecific skin eruption  Skin rash on distal leg and ankle, partially improved with topical steroid treatment compared to initially.  Orders: Orders Placed This Encounter  Procedures   Uric acid   No orders of the defined types were placed in this encounter.    Follow-Up Instructions: Return in about 3 months (around 03/10/2022), or if symptoms worsen or fail to improve, for Gout on uloric/shoulder impingement f/u 29mos.   Collier Salina, MD  Note - This record has been created using [employer].  Chart creation errors have been sought, but may not always  have been located. Such creation errors do not reflect on  the standard of medical care.

## 2021-12-16 NOTE — Progress Notes (Signed)
Uric acid is 7.1 this is slightly above goal still but it is better than 3 months ago and with his low amount of flares I think continuing the current uloric 80 mg daily is fine.

## 2021-12-18 ENCOUNTER — Other Ambulatory Visit: Payer: Self-pay | Admitting: Internal Medicine

## 2021-12-18 DIAGNOSIS — M1040 Other secondary gout, unspecified site: Secondary | ICD-10-CM

## 2021-12-18 NOTE — Telephone Encounter (Signed)
Next Visit: 03/11/2022  Last Visit: 12/11/2021  Last Fill: 07/22/2021  DX: Secondary gout   Current Dose per lab note 12/11/2021 continuing the current uloric 80 mg daily is fine.   Labs: 11/28/2021 WBC 12.6, Hgb 12.1, Hct 36.8, RBC 4.30, Chloride 97, BUN 49, Creat. 4.0 Glucose 144, Anion Gap 15, 12/11/2021 Uric Acid 7.1  Okay to refill Uloric?

## 2021-12-31 DIAGNOSIS — I129 Hypertensive chronic kidney disease with stage 1 through stage 4 chronic kidney disease, or unspecified chronic kidney disease: Secondary | ICD-10-CM | POA: Diagnosis not present

## 2021-12-31 DIAGNOSIS — I5032 Chronic diastolic (congestive) heart failure: Secondary | ICD-10-CM | POA: Diagnosis not present

## 2021-12-31 DIAGNOSIS — N184 Chronic kidney disease, stage 4 (severe): Secondary | ICD-10-CM | POA: Diagnosis not present

## 2021-12-31 DIAGNOSIS — N189 Chronic kidney disease, unspecified: Secondary | ICD-10-CM | POA: Diagnosis not present

## 2021-12-31 DIAGNOSIS — E1122 Type 2 diabetes mellitus with diabetic chronic kidney disease: Secondary | ICD-10-CM | POA: Diagnosis not present

## 2021-12-31 DIAGNOSIS — R808 Other proteinuria: Secondary | ICD-10-CM | POA: Diagnosis not present

## 2021-12-31 DIAGNOSIS — N041 Nephrotic syndrome with focal and segmental glomerular lesions: Secondary | ICD-10-CM | POA: Diagnosis not present

## 2021-12-31 DIAGNOSIS — D631 Anemia in chronic kidney disease: Secondary | ICD-10-CM | POA: Diagnosis not present

## 2022-01-01 ENCOUNTER — Other Ambulatory Visit: Payer: Self-pay | Admitting: Urology

## 2022-01-30 DIAGNOSIS — N041 Nephrotic syndrome with focal and segmental glomerular lesions: Secondary | ICD-10-CM | POA: Diagnosis not present

## 2022-01-30 DIAGNOSIS — I5032 Chronic diastolic (congestive) heart failure: Secondary | ICD-10-CM | POA: Diagnosis not present

## 2022-01-30 DIAGNOSIS — N189 Chronic kidney disease, unspecified: Secondary | ICD-10-CM | POA: Diagnosis not present

## 2022-01-30 DIAGNOSIS — E1122 Type 2 diabetes mellitus with diabetic chronic kidney disease: Secondary | ICD-10-CM | POA: Diagnosis not present

## 2022-01-30 DIAGNOSIS — E872 Acidosis, unspecified: Secondary | ICD-10-CM | POA: Diagnosis not present

## 2022-01-30 DIAGNOSIS — E878 Other disorders of electrolyte and fluid balance, not elsewhere classified: Secondary | ICD-10-CM | POA: Diagnosis not present

## 2022-01-30 DIAGNOSIS — E559 Vitamin D deficiency, unspecified: Secondary | ICD-10-CM | POA: Diagnosis not present

## 2022-01-30 DIAGNOSIS — N184 Chronic kidney disease, stage 4 (severe): Secondary | ICD-10-CM | POA: Diagnosis not present

## 2022-01-30 DIAGNOSIS — R808 Other proteinuria: Secondary | ICD-10-CM | POA: Diagnosis not present

## 2022-01-30 DIAGNOSIS — I129 Hypertensive chronic kidney disease with stage 1 through stage 4 chronic kidney disease, or unspecified chronic kidney disease: Secondary | ICD-10-CM | POA: Diagnosis not present

## 2022-01-30 DIAGNOSIS — E211 Secondary hyperparathyroidism, not elsewhere classified: Secondary | ICD-10-CM | POA: Diagnosis not present

## 2022-01-30 DIAGNOSIS — N170 Acute kidney failure with tubular necrosis: Secondary | ICD-10-CM | POA: Diagnosis not present

## 2022-01-30 DIAGNOSIS — D631 Anemia in chronic kidney disease: Secondary | ICD-10-CM | POA: Diagnosis not present

## 2022-02-05 DIAGNOSIS — E1122 Type 2 diabetes mellitus with diabetic chronic kidney disease: Secondary | ICD-10-CM | POA: Diagnosis not present

## 2022-02-05 DIAGNOSIS — Z8639 Personal history of other endocrine, nutritional and metabolic disease: Secondary | ICD-10-CM | POA: Diagnosis not present

## 2022-02-05 DIAGNOSIS — I129 Hypertensive chronic kidney disease with stage 1 through stage 4 chronic kidney disease, or unspecified chronic kidney disease: Secondary | ICD-10-CM | POA: Diagnosis not present

## 2022-02-05 DIAGNOSIS — E785 Hyperlipidemia, unspecified: Secondary | ICD-10-CM | POA: Diagnosis not present

## 2022-02-05 DIAGNOSIS — Z6837 Body mass index (BMI) 37.0-37.9, adult: Secondary | ICD-10-CM | POA: Diagnosis not present

## 2022-02-05 DIAGNOSIS — N184 Chronic kidney disease, stage 4 (severe): Secondary | ICD-10-CM | POA: Diagnosis not present

## 2022-02-05 DIAGNOSIS — E039 Hypothyroidism, unspecified: Secondary | ICD-10-CM | POA: Diagnosis not present

## 2022-02-21 ENCOUNTER — Ambulatory Visit: Payer: Medicare HMO | Admitting: Gastroenterology

## 2022-02-25 ENCOUNTER — Other Ambulatory Visit: Payer: Self-pay | Admitting: Cardiology

## 2022-02-25 DIAGNOSIS — N184 Chronic kidney disease, stage 4 (severe): Secondary | ICD-10-CM | POA: Diagnosis not present

## 2022-02-27 DIAGNOSIS — N184 Chronic kidney disease, stage 4 (severe): Secondary | ICD-10-CM | POA: Diagnosis not present

## 2022-02-27 DIAGNOSIS — I129 Hypertensive chronic kidney disease with stage 1 through stage 4 chronic kidney disease, or unspecified chronic kidney disease: Secondary | ICD-10-CM | POA: Diagnosis not present

## 2022-02-27 DIAGNOSIS — R000 Tachycardia, unspecified: Secondary | ICD-10-CM | POA: Diagnosis not present

## 2022-02-27 DIAGNOSIS — N400 Enlarged prostate without lower urinary tract symptoms: Secondary | ICD-10-CM | POA: Diagnosis not present

## 2022-02-27 DIAGNOSIS — E119 Type 2 diabetes mellitus without complications: Secondary | ICD-10-CM | POA: Diagnosis not present

## 2022-02-27 DIAGNOSIS — Z6841 Body Mass Index (BMI) 40.0 and over, adult: Secondary | ICD-10-CM | POA: Diagnosis not present

## 2022-02-27 DIAGNOSIS — G4733 Obstructive sleep apnea (adult) (pediatric): Secondary | ICD-10-CM | POA: Diagnosis not present

## 2022-03-03 NOTE — Progress Notes (Deleted)
Office Visit Note  Patient: Brandon Warren             Date of Birth: 1951-03-20           MRN: [id number]             PCP: Redmond School, MD Referring: Redmond School, MD Visit Date: 03/11/2022   Subjective:  No chief complaint on file.   History of Present Illness: Brandon Warren is a 71 y.o. male here for follow up for gout on uloric   Previous HPI 12/11/2021 Brandon Warren is a 71 y.o. male here for follow up for gout on uloric increased from 40 mg daily to 80 mg daily after last visit. He has not suffered any major gout flare ups since that time. He saw [facility] for additional evaluation recommended by Dr. 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 He is seeing bariatric clinic current medically managed weight loss with a ketogenic diet and ozempic treatment. His right knee is painful after his last long distance drive but somewhat improving. Right shoulder pain is bothering him with radiation down the arm from certain positions.    Previous HPI 09/10/21 Brandon Warren is a 71 y.o. 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 He had interval events with stopping azathioprine based on side effect and switching to uloric. He has also followed up with Dr. Dr. Theador Hawthorne for his chronic renal disease. He has not suffered any significant gout flare since our last visit. He has experienced increase in lower extremity rashes with redness, itching, and swelling on the lower shins and feet. He was using halobetasol up to 3 times daily but was running out of this medication so stopped using it regularly. He is also curious about getting the COVID booster dose.   Previous HPI 02/11/21 Brandon Warren is a 70 y.o. male with a history of hypertension, hypothyroidism, coronary artery disease, type 2 diabetes, hyperprolactinemia, and CKD stage IIIb here for evaluation of elevated sedimentation rate and joint  pain in multiple sites.  He describes symptoms starting about 2 years ago initially with pain and inflammation in the left great toe originally diagnosed as gout.  He has inflammatory episodes alternating between the right and left feet initially but after several months started to develop pain and swelling in other areas.  He had right knee inflammation with associated warmth and had the aspiration apparently showing clear fluid.  The symptoms have recurred multiple times he describes it as swelling and warmth but pain is more radiating proximal and distal to the joint itself.  He had an episode with this kind of inflammation in the right elbow.  He had left shoulder pain with work-up was attributed to impingement syndrome symptoms have partially improved.  He now describes increasing symptoms in the right shoulder.  He does have radiation of numbness to the second and third digits on the right hand.  This sometimes causes weakness or dropping items.  He states individual episodes of joint swelling last 3 to 4 days do not seem to improve any more quickly when he takes colchicine then when he does not.  He is not on any maintenance urate lowering therapy.   Besides the joint pains he is also noticed developing small red lesions on both anterior shins. No spontaneous changes and partial loss of the left great toenail.  He has increased redness and  swelling of the feet and a little bit above the right ankle.  He does have diabetic neuropathy he attributes this too but the increasing swelling is newer.     No Rheumatology ROS completed.   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   Dysuria 02/19/2021   Elevated sedimentation rate 02/11/2021   Bilateral shoulder pain 02/11/2021   Bilateral foot pain 02/11/2021   Bilateral hand pain 02/11/2021   CKD stage 4 secondary to hypertension ([place]) 04/17/2020    Diabetes mellitus type 2 in obese ([place]) 05/21/2017   Hyperprolactinemia ([place]) 05/12/2017   Chest pain 04/20/2016   Encounter for screening colonoscopy 01/24/2014   Pseudophakia, left eye 06/08/2013   Status post laser cataract surgery of left eye 05/25/2013   Bradycardia 05/20/2013   Dizziness 05/20/2013   Coronary artery disease involving native coronary artery of native heart with angina pectoris ([place]) 05/19/2013   Essential hypertension, benign 05/19/2013   Hypertriglyceridemia 05/19/2013   Hypothyroidism 05/19/2013   Morbid obesity ([place]) 05/19/2013   Amblyopia of right eye 05/02/2013   Combined form of senile cataract 05/02/2013    Past Medical History:  Diagnosis Date   Arthritis    Back pain    BPH (benign prostatic hyperplasia)    CAD S/P percutaneous coronary angioplasty    a. 2005: PCI OM - Zomax study stent DES  2.5 x16 mm. b. cath in 02/2018 showing 20% ISR of LCx stent and 20% stenosis along mid-distal LM.    Controlled type 2 diabetes with neuropathy (HCC)    Edema of both lower extremities    Food allergy    Gout    Hypercholesterolemia    Hypertension    Hypothyroidism    Kidney disease    MI (myocardial infarction) ([place])    in his 71s   Sleep apnea    SOB (shortness of breath)     Family History  Problem Relation Age of Onset   Colon polyps Mother    Hypertension Mother    High Cholesterol Father    Heart disease Father    COPD Father    Thyroid disease Daughter    Healthy Daughter    Healthy Other    Colon cancer Neg Hx    Past Surgical History:  Procedure Laterality Date   APPENDECTOMY     CATARACT EXTRACTION Left    COLONOSCOPY N/A 02/01/2014   Procedure: COLONOSCOPY;  Surgeon: Daneil Dolin, MD;  [facility];  Service: Endoscopy;  Laterality: N/A;  8:30   CORONARY ANGIOPLASTY WITH STENT PLACEMENT  2005   PCI- OM1 Zomax study stent 2.5 x16 mm.   LEFT HEART CATH AND CORONARY ANGIOGRAPHY N/A 03/15/2018   Procedure: LEFT HEART CATH  AND CORONARY ANGIOGRAPHY;  Surgeon: Leonie Man, MD;  Location: [facility];  Service: Cardiovascular;  Laterality: N/A;   Social History   Social History Narrative   Not on file    There is no immunization history on file for this patient.   Objective: Vital Signs: There were no vitals taken for this visit.   Physical Exam   Musculoskeletal Exam: ***  CDAI Exam: CDAI Score: -- Patient Global: --; Provider Global: -- Swollen: --; Tender: -- Joint Exam 03/11/2022   No joint exam has been documented for this visit   There is currently no information documented on the homunculus. Go to the  Rheumatology activity and complete the homunculus joint exam.  Investigation: No additional findings.  Imaging: No results found.  Recent Labs: Lab Results  Component Value Date   WBC 12.8 (H) 06/07/2021   HGB 13.4 06/07/2021   PLT 304 06/07/2021   NA 138 04/30/2021   K 4.6 04/30/2021   CL 101 04/30/2021   CO2 18 (L) 04/30/2021   GLUCOSE 124 (H) 04/30/2021   BUN 30 (H) 04/30/2021   CREATININE 1.96 (H) 04/30/2021   BILITOT 0.3 04/30/2021   ALKPHOS 60 04/30/2021   AST 25 04/30/2021   ALT 31 04/30/2021   PROT 7.4 04/30/2021   ALBUMIN 4.4 04/30/2021   CALCIUM 9.6 04/30/2021   GFRAA 42 (L) 04/16/2020    Speciality Comments: No specialty comments available.  Procedures:  No procedures performed Allergies: Meat extract, Lipitor [atorvastatin], Simvastatin, and Allopurinol   Assessment / Plan:     Visit Diagnoses: No diagnosis found.  ***  Orders: No orders of the defined types were placed in this encounter.  No orders of the defined types were placed in this encounter.    Follow-Up Instructions: No follow-ups on file.   Earnestine Mealing, CMA  Note - This record has been created using Editor, commissioning.  Chart creation errors have been sought, but may not always  have been located. Such creation errors do not reflect on  the standard of medical care.

## 2022-03-11 ENCOUNTER — Ambulatory Visit: Payer: Medicare HMO | Admitting: Internal Medicine

## 2022-03-11 DIAGNOSIS — M1040 Other secondary gout, unspecified site: Secondary | ICD-10-CM

## 2022-03-11 DIAGNOSIS — R21 Rash and other nonspecific skin eruption: Secondary | ICD-10-CM

## 2022-03-13 DIAGNOSIS — E211 Secondary hyperparathyroidism, not elsewhere classified: Secondary | ICD-10-CM | POA: Diagnosis not present

## 2022-03-13 DIAGNOSIS — E1122 Type 2 diabetes mellitus with diabetic chronic kidney disease: Secondary | ICD-10-CM | POA: Diagnosis not present

## 2022-03-13 DIAGNOSIS — I129 Hypertensive chronic kidney disease with stage 1 through stage 4 chronic kidney disease, or unspecified chronic kidney disease: Secondary | ICD-10-CM | POA: Diagnosis not present

## 2022-03-13 DIAGNOSIS — N189 Chronic kidney disease, unspecified: Secondary | ICD-10-CM | POA: Diagnosis not present

## 2022-03-13 DIAGNOSIS — I5032 Chronic diastolic (congestive) heart failure: Secondary | ICD-10-CM | POA: Diagnosis not present

## 2022-03-13 DIAGNOSIS — R808 Other proteinuria: Secondary | ICD-10-CM | POA: Diagnosis not present

## 2022-03-13 DIAGNOSIS — N041 Nephrotic syndrome with focal and segmental glomerular lesions: Secondary | ICD-10-CM | POA: Diagnosis not present

## 2022-03-17 NOTE — Progress Notes (Deleted)
Office Visit Note  Patient: Brandon Warren             Date of Birth: 10-18-1951           MRN: [id number]             PCP: Redmond School, MD Referring: Redmond School, MD Visit Date: 03/31/2022   Subjective:  No chief complaint on file.   History of Present Illness: Brandon Warren is a 71 y.o. male here for follow up for gout on uloric 80 mg daily.    Previous HPI 12/11/2021 Brandon Warren is a 71 y.o. male here for follow up for gout on uloric increased from 40 mg daily to 80 mg daily after last visit. He has not suffered any major gout flare ups since that time. He saw [facility] for additional evaluation recommended by Dr. 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 He is seeing bariatric clinic current medically managed weight loss with a ketogenic diet and ozempic treatment. His right knee is painful after his last long distance drive but somewhat improving. Right shoulder pain is bothering him with radiation down the arm from certain positions.    Previous HPI 09/10/21 Brandon Warren is a 71 y.o. 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 He had interval events with stopping azathioprine based on side effect and switching to uloric. He has also followed up with Dr. Dr. Theador Hawthorne for his chronic renal disease. He has not suffered any significant gout flare since our last visit. He has experienced increase in lower extremity rashes with redness, itching, and swelling on the lower shins and feet. He was using halobetasol up to 3 times daily but was running out of this medication so stopped using it regularly. He is also curious about getting the COVID booster dose.   Previous HPI 02/11/21 Brandon Warren is a 71 y.o. male with a history of hypertension, hypothyroidism, coronary artery disease, type 2 diabetes, hyperprolactinemia, and CKD stage IIIb here for evaluation of elevated sedimentation  rate and joint pain in multiple sites.  He describes symptoms starting about 2 years ago initially with pain and inflammation in the left great toe originally diagnosed as gout.  He has inflammatory episodes alternating between the right and left feet initially but after several months started to develop pain and swelling in other areas.  He had right knee inflammation with associated warmth and had the aspiration apparently showing clear fluid.  The symptoms have recurred multiple times he describes it as swelling and warmth but pain is more radiating proximal and distal to the joint itself.  He had an episode with this kind of inflammation in the right elbow.  He had left shoulder pain with work-up was attributed to impingement syndrome symptoms have partially improved.  He now describes increasing symptoms in the right shoulder.  He does have radiation of numbness to the second and third digits on the right hand.  This sometimes causes weakness or dropping items.  He states individual episodes of joint swelling last 3 to 4 days do not seem to improve any more quickly when he takes colchicine then when he does not.  He is not on any maintenance urate lowering therapy.   Besides the joint pains he is also noticed developing small red lesions on both anterior shins. No spontaneous changes and partial loss of the left great toenail.  He  has increased redness and swelling of the feet and a little bit above the right ankle.  He does have diabetic neuropathy he attributes this too but the increasing swelling is newer.     No Rheumatology ROS completed.   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   Dysuria 02/19/2021   Elevated sedimentation rate 02/11/2021   Bilateral shoulder pain 02/11/2021   Bilateral foot pain 02/11/2021   Bilateral hand pain 02/11/2021   CKD stage 4 secondary to hypertension  ([place]) 04/17/2020   Diabetes mellitus type 2 in obese ([place]) 05/21/2017   Hyperprolactinemia ([place]) 05/12/2017   Chest pain 04/20/2016   Encounter for screening colonoscopy 01/24/2014   Pseudophakia, left eye 06/08/2013   Status post laser cataract surgery of left eye 05/25/2013   Bradycardia 05/20/2013   Dizziness 05/20/2013   Coronary artery disease involving native coronary artery of native heart with angina pectoris ([place]) 05/19/2013   Essential hypertension, benign 05/19/2013   Hypertriglyceridemia 05/19/2013   Hypothyroidism 05/19/2013   Morbid obesity ([place]) 05/19/2013   Amblyopia of right eye 05/02/2013   Combined form of senile cataract 05/02/2013    Past Medical History:  Diagnosis Date   Arthritis    Back pain    BPH (benign prostatic hyperplasia)    CAD S/P percutaneous coronary angioplasty    a. 2005: PCI OM - Zomax study stent DES  2.5 x16 mm. b. cath in 02/2018 showing 20% ISR of LCx stent and 20% stenosis along mid-distal LM.    Controlled type 2 diabetes with neuropathy (HCC)    Edema of both lower extremities    Food allergy    Gout    Hypercholesterolemia    Hypertension    Hypothyroidism    Kidney disease    MI (myocardial infarction) ([place])    in his 96s   Sleep apnea    SOB (shortness of breath)     Family History  Problem Relation Age of Onset   Colon polyps Mother    Hypertension Mother    High Cholesterol Father    Heart disease Father    COPD Father    Thyroid disease Daughter    Healthy Daughter    Healthy Other    Colon cancer Neg Hx    Past Surgical History:  Procedure Laterality Date   APPENDECTOMY     CATARACT EXTRACTION Left    COLONOSCOPY N/A 02/01/2014   Procedure: COLONOSCOPY;  Surgeon: Daneil Dolin, MD;  [facility];  Service: Endoscopy;  Laterality: N/A;  8:30   CORONARY ANGIOPLASTY WITH STENT PLACEMENT  2005   PCI- OM1 Zomax study stent 2.5 x16 mm.   LEFT HEART CATH AND CORONARY ANGIOGRAPHY N/A 03/15/2018    Procedure: LEFT HEART CATH AND CORONARY ANGIOGRAPHY;  Surgeon: Leonie Man, MD;  Location: [facility];  Service: Cardiovascular;  Laterality: N/A;   Social History   Social History Narrative   Not on file    There is no immunization history on file for this patient.   Objective: Vital Signs: There were no vitals taken for this visit.   Physical Exam   Musculoskeletal Exam: ***  CDAI Exam: CDAI Score: -- Patient Global: --; Provider Global: -- Swollen: --; Tender: -- Joint Exam 03/31/2022   No joint exam has been documented for this visit   There is currently no information documented on the  homunculus. Go to the Rheumatology activity and complete the homunculus joint exam.  Investigation: No additional findings.  Imaging: No results found.  Recent Labs: Lab Results  Component Value Date   WBC 12.8 (H) 06/07/2021   HGB 13.4 06/07/2021   PLT 304 06/07/2021   NA 138 04/30/2021   K 4.6 04/30/2021   CL 101 04/30/2021   CO2 18 (L) 04/30/2021   GLUCOSE 124 (H) 04/30/2021   BUN 30 (H) 04/30/2021   CREATININE 1.96 (H) 04/30/2021   BILITOT 0.3 04/30/2021   ALKPHOS 60 04/30/2021   AST 25 04/30/2021   ALT 31 04/30/2021   PROT 7.4 04/30/2021   ALBUMIN 4.4 04/30/2021   CALCIUM 9.6 04/30/2021   GFRAA 42 (L) 04/16/2020    Speciality Comments: No specialty comments available.  Procedures:  No procedures performed Allergies: Meat extract, Lipitor [atorvastatin], Simvastatin, and Allopurinol   Assessment / Plan:     Visit Diagnoses: No diagnosis found.  ***  Orders: No orders of the defined types were placed in this encounter.  No orders of the defined types were placed in this encounter.    Follow-Up Instructions: No follow-ups on file.   Earnestine Mealing, CMA  Note - This record has been created using Editor, commissioning.  Chart creation errors have been sought, but may not always  have been located. Such creation errors do not reflect on  the  standard of medical care.

## 2022-03-26 ENCOUNTER — Other Ambulatory Visit: Payer: Self-pay | Admitting: Internal Medicine

## 2022-03-26 DIAGNOSIS — M1040 Other secondary gout, unspecified site: Secondary | ICD-10-CM

## 2022-03-26 MED ORDER — FEBUXOSTAT 80 MG PO TABS: ORAL_TABLET | ORAL | 3 refills | Status: AC

## 2022-03-26 NOTE — Telephone Encounter (Signed)
Patient called the office stating Dr. Benjamine Mola has been treating his gout for a while. Patient states he recently found out he has Stage 4 kidney disease. 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   Patient states he takes Febuxostat '40mg'$  a day as was prescribed by Dr. Benjamine Mola. Patient states he would like his nephrologist to take over care for his high uric acid if Dr. Benjamine Mola is ok with that. Patient sates he needs a refill of Febuxostat '40mg'$  to be sent to [facility] but will have his nephrologist take over the prescription at his next appointment with them.   Patient states he wants to make sure that's ok with Dr Benjamine Mola to transfer that care and maybe cancel his June 5th appointment. Patient requests a call back.

## 2022-03-26 NOTE — Telephone Encounter (Signed)
Patient advised Dr. Benjamine Mola is okay with that, Dr. Theador Hawthorne is very good with this situation.Patient advised Dr. Higinio Plan is okay to refill for now and cancel appointment. But his current uloric tablet has been 80 mg daily for months, medical fill report indicates getting this since November. Patient states he may be taking Uloric 80 mg but has not updated his medication list he has. Patient states he needs a refill of Uloric sent to the [facility].

## 2022-03-26 NOTE — Telephone Encounter (Signed)
I am okay with that, Dr. Theador Hawthorne is very good with this situation. I am okay to refill for now and cancel appointment. But his current uloric tablet has been 80 mg daily for months, medical fill report indicates getting this since November.

## 2022-03-31 ENCOUNTER — Ambulatory Visit: Payer: Medicare HMO | Admitting: Internal Medicine

## 2022-03-31 DIAGNOSIS — R21 Rash and other nonspecific skin eruption: Secondary | ICD-10-CM

## 2022-03-31 DIAGNOSIS — M1040 Other secondary gout, unspecified site: Secondary | ICD-10-CM

## 2022-04-07 DIAGNOSIS — N041 Nephrotic syndrome with focal and segmental glomerular lesions: Secondary | ICD-10-CM | POA: Diagnosis not present

## 2022-04-07 DIAGNOSIS — R808 Other proteinuria: Secondary | ICD-10-CM | POA: Diagnosis not present

## 2022-04-07 DIAGNOSIS — N189 Chronic kidney disease, unspecified: Secondary | ICD-10-CM | POA: Diagnosis not present

## 2022-04-07 DIAGNOSIS — I5032 Chronic diastolic (congestive) heart failure: Secondary | ICD-10-CM | POA: Diagnosis not present

## 2022-04-07 DIAGNOSIS — N170 Acute kidney failure with tubular necrosis: Secondary | ICD-10-CM | POA: Diagnosis not present

## 2022-04-07 DIAGNOSIS — E211 Secondary hyperparathyroidism, not elsewhere classified: Secondary | ICD-10-CM | POA: Diagnosis not present

## 2022-04-07 DIAGNOSIS — E1122 Type 2 diabetes mellitus with diabetic chronic kidney disease: Secondary | ICD-10-CM | POA: Diagnosis not present

## 2022-04-07 DIAGNOSIS — I129 Hypertensive chronic kidney disease with stage 1 through stage 4 chronic kidney disease, or unspecified chronic kidney disease: Secondary | ICD-10-CM | POA: Diagnosis not present

## 2022-04-08 DIAGNOSIS — I872 Venous insufficiency (chronic) (peripheral): Secondary | ICD-10-CM | POA: Diagnosis not present

## 2022-04-10 ENCOUNTER — Telehealth: Payer: Self-pay | Admitting: Cardiology

## 2022-04-10 DIAGNOSIS — N189 Chronic kidney disease, unspecified: Secondary | ICD-10-CM | POA: Diagnosis not present

## 2022-04-10 DIAGNOSIS — R55 Syncope and collapse: Secondary | ICD-10-CM | POA: Diagnosis not present

## 2022-04-10 DIAGNOSIS — E8722 Chronic metabolic acidosis: Secondary | ICD-10-CM | POA: Diagnosis not present

## 2022-04-10 DIAGNOSIS — Z6835 Body mass index (BMI) 35.0-35.9, adult: Secondary | ICD-10-CM | POA: Diagnosis not present

## 2022-04-10 DIAGNOSIS — I5032 Chronic diastolic (congestive) heart failure: Secondary | ICD-10-CM | POA: Diagnosis not present

## 2022-04-10 DIAGNOSIS — E1122 Type 2 diabetes mellitus with diabetic chronic kidney disease: Secondary | ICD-10-CM | POA: Diagnosis not present

## 2022-04-10 DIAGNOSIS — R808 Other proteinuria: Secondary | ICD-10-CM | POA: Diagnosis not present

## 2022-04-10 DIAGNOSIS — I129 Hypertensive chronic kidney disease with stage 1 through stage 4 chronic kidney disease, or unspecified chronic kidney disease: Secondary | ICD-10-CM | POA: Diagnosis not present

## 2022-04-10 DIAGNOSIS — N041 Nephrotic syndrome with focal and segmental glomerular lesions: Secondary | ICD-10-CM | POA: Diagnosis not present

## 2022-04-10 NOTE — Telephone Encounter (Signed)
Pt c/o Syncope: STAT if syncope occurred within 30 minutes and pt complains of lightheadedness High Priority if episode of passing out, completely, today or in last 24 hours   Did you pass out today? No    When is the last time you passed out? 03/10/22  Has this occurred multiple times? No    Did you have any symptoms prior to passing out? No    Has since had one dizzy episode about a week ago where he felt he may pass out again. Completely passed out on 05/15 EMT was called EKG was fine so he was not admitted. BP ranged 90's-100's/50 when episode occurred.  BP came back up before EMT left. Patient has lost 40 lbs over last few months due to strict diet. States BP is ranging normal since. Patient states he was sick with something raspatory prior and may have been dehydrated when syncope occurred. Please advise.

## 2022-04-11 NOTE — Telephone Encounter (Signed)
Patient notified and verbalized understanding. Patient will update office on bp in 1 week.

## 2022-04-11 NOTE — Telephone Encounter (Signed)
I see that his neprhologist just lowered his norvasc to '5mg'$  daily, I would agree. Often with weight loss bp's come down and we have to wean bp meds. Likely has low normal bp's and when he got dehydrated bp's dropped even further. Have him update [place] on bp's in 1 week please   Zandra Abts MD

## 2022-04-21 ENCOUNTER — Encounter: Payer: Self-pay | Admitting: Cardiology

## 2022-04-22 NOTE — Telephone Encounter (Signed)
I think overall bp's are ok after med change, no additional changes at this time  Dominga Ferry MD

## 2022-04-28 ENCOUNTER — Encounter: Payer: Self-pay | Admitting: Gastroenterology

## 2022-04-28 NOTE — Progress Notes (Unsigned)
Referring Provider: Redmond School, MD Primary Care Physician:  Redmond School, MD Primary Gastroenterologist:  Dr. Gala Romney  No chief complaint on file.   HPI:   Brandon Warren is a 71 y.o. male presenting today at the request of Dr. Gerarda Fraction for abdominal pain and consult colonoscopy.   Last colonoscopy was in April 2015 with 2 diminutive polyps 5 cm from the anal verge removed (hyperplastic polyps), scattered left-sided diverticula, 4 mm polyp in the mid ascending colon removed (tubular adenoma), otherwise normal exam.   Ultrasound in December 2022 for abdominal pain with no biliary abnormalities, fatty liver, abdominal aortic aneurysm measuring 3.2 cm, 1.6 splenic cyst.  Today:       He has anemia, likely secondary to CKD. Iron panel wnl in January 2023, ferritin of 268.   Past Medical History:  Diagnosis Date   Arthritis    Back pain    BPH (benign prostatic hyperplasia)    CAD S/P percutaneous coronary angioplasty    a. 2005: PCI OM - Zomax study stent DES  2.5 x16 mm. b. cath in 02/2018 showing 20% ISR of LCx stent and 20% stenosis along mid-distal LM.    Controlled type 2 diabetes with neuropathy (HCC)    Edema of both lower extremities    Food allergy    Gout    Hypercholesterolemia    Hypertension    Hypothyroidism    Kidney disease    MI (myocardial infarction) ([place])    in his 67s   Sleep apnea    SOB (shortness of breath)     Past Surgical History:  Procedure Laterality Date   APPENDECTOMY     CATARACT EXTRACTION Left    COLONOSCOPY N/A 02/01/2014   Procedure: COLONOSCOPY;  Surgeon: Daneil Dolin, MD;  [facility];  Service: Endoscopy;  Laterality: N/A;  8:30   CORONARY ANGIOPLASTY WITH STENT PLACEMENT  2005   PCI- OM1 Zomax study stent 2.5 x16 mm.   LEFT HEART CATH AND CORONARY ANGIOGRAPHY N/A 03/15/2018   Procedure: LEFT HEART CATH AND CORONARY ANGIOGRAPHY;  Surgeon: Leonie Man, MD;  Location: [place] [facility];  Service:  Cardiovascular;  Laterality: N/A;    Current Outpatient Medications  Medication Sig Dispense Refill   acetaminophen (TYLENOL) 500 MG tablet Take 500 mg by mouth 2 (two) times daily as needed (for pain.).     ALPRAZolam (XANAX) 0.5 MG tablet Take 0.5 mg by mouth daily as needed for anxiety.     aspirin EC 81 MG tablet Take 81 mg by mouth every evening.     bromocriptine (PARLODEL) 2.5 MG tablet Take 0.5 tablets (1.25 mg total) by mouth at bedtime. 45 tablet 3   carvedilol (COREG) 25 MG tablet TAKE ONE AND ONE-HALF TABLET (37.'5MG'$  TOTAL) BY MOUTH TWO TIMES A DAY 270 tablet 3   Cholecalciferol (VITAMIN D3) 5000 units CAPS Take 1 capsule (5,000 Units total) by mouth daily. 90 capsule 0   diazepam (VALIUM) 10 MG tablet Take 10 mg by mouth daily as needed (muscle spasms).     diltiazem (CARDIZEM) 30 MG tablet Take 1 tablet (30 mg total) by mouth 2 (two) times daily. 180 tablet 3   Dulaglutide (TRULICITY) 4.5 ON/6.2XB SOPN Inject 4.5 mg as directed once a week. (Patient not taking: Reported on 12/11/2021) 6 mL 3   EPINEPHrine 0.3 mg/0.3 mL IJ SOAJ injection Inject 0.3 mg into the muscle as needed for anaphylaxis.     ezetimibe (ZETIA) 10 MG tablet Take  10 mg by mouth at bedtime.     Febuxostat 80 MG TABS TAKE ONE TABLET (80 MG TOTAL) BY MOUTH DAILY. 30 tablet 3   ferrous sulfate 325 (65 FE) MG tablet Take 325 mg by mouth 3 (three) times daily.     finasteride (PROSCAR) 5 MG tablet Take 5 mg by mouth daily.      furosemide (LASIX) 20 MG tablet Take 20 mg daily if you have more than 3 lbs weight gain in 24 hrs (Patient taking differently: Take 20 mg by mouth 2 (two) times daily.) 30 tablet 3   hydrALAZINE (APRESOLINE) 100 MG tablet TAKE ONE TABLET ('100MG'$  TOTAL) BY MOUTH THREE TIMES DAILY 270 tablet 3   hyoscyamine (LEVSIN SL) 0.125 MG SL tablet Place under the tongue 4 (four) times daily as needed. (Patient not taking: Reported on 12/11/2021)     levothyroxine (SYNTHROID) 25 MCG tablet TAKE ONE TABLET  (25MCG TOTAL) BY MOUTH DAILY BEFORE BREAKFAST. TAKE ALONG WITH 200MCG FOR TOTAL DOSE OF 225MCG. (Patient taking differently: Take 25 mcg by mouth daily before breakfast. Take with 200 mcg to equal 225 mcg daily) 90 tablet 0   levothyroxine (SYNTHROID, LEVOTHROID) 200 MCG tablet TAKE ONE TABLET BY MOUTH EVERY DAY BEFORE BREAKFAST (Patient taking differently: Take 200 mcg by mouth daily before breakfast. Take with 25 mcg to equal 225 mcg daily) 90 tablet 0   losartan (COZAAR) 100 MG tablet TAKE ONE TABLET ('100MG'$  TOTAL) BY MOUTH DAILY 7 tablet 0   MITIGARE 0.6 MG CAPS Take 0.6 mg by mouth 2 (two) times daily as needed (gout flare).     omega-3 acid ethyl esters (LOVAZA) 1 g capsule TAKE ONE CAPSULE BY MOUTH EVERY 12 HOURS 7 capsule 0   OVER THE COUNTER MEDICATION Apply 1 application topically daily as needed (rash). Terrasil cream (Patient not taking: Reported on 12/11/2021)     OVER THE COUNTER MEDICATION Apply 1 application topically daily as needed (itching). E45 itch relief cream (Patient not taking: Reported on 12/11/2021)     Semaglutide, 2 MG/DOSE, (OZEMPIC, 2 MG/DOSE,) 8 MG/3ML SOPN Inject into the skin.     sodium bicarbonate 650 MG tablet Take 650 mg by mouth 3 (three) times daily.     tamsulosin (FLOMAX) 0.4 MG CAPS capsule TAKE ONE (1) CAPSULE BY MOUTH TWICE A DAY. (EVERY 12 HOURS.) 180 capsule 11   TRADJENTA 5 MG TABS tablet Take 5 mg by mouth daily.     triamcinolone ointment (KENALOG) 0.5 % Apply 1 application topically 2 (two) times daily as needed. (Patient not taking: Reported on 12/11/2021) 30 g 0   No current facility-administered medications for this visit.    Allergies as of 04/30/2022 - Review Complete 12/11/2021  Allergen Reaction Noted   Meat extract Diarrhea, Nausea Only, and Other (See Comments) 04/20/2016   Lipitor [atorvastatin] Other (See Comments) 05/19/2013   Simvastatin Other (See Comments) 05/19/2013   Allopurinol Rash 06/05/2021    Family History  Problem  Relation Age of Onset   Colon polyps Mother    Hypertension Mother    High Cholesterol Father    Heart disease Father    COPD Father    Thyroid disease Daughter    Healthy Daughter    Healthy Other    Colon cancer Neg Hx     Social History   Socioeconomic History   Marital status: Married    Spouse name: Dor   Number of children: Not on file   Years of education: Not on  file   Highest education level: Not on file  Occupational History   Occupation: Conservation officer, historic buildings  Tobacco Use   Smoking status: Former    Packs/day: 2.00    Years: 30.00    Total pack years: 60.00    Types: Cigarettes    Quit date: 02/02/2004    Years since quitting: 18.2   Smokeless tobacco: Never  Vaping Use   Vaping Use: Never used  Substance and Sexual Activity   Alcohol use: Yes    Comment: occasional   Drug use: No   Sexual activity: Yes    Birth control/protection: None  Other Topics Concern   Not on file  Social History Narrative   Not on file   Social Determinants of Health   Financial Resource Strain: Not on file  Food Insecurity: Not on file  Transportation Needs: Not on file  Physical Activity: Not on file  Stress: Not on file  Social Connections: Not on file  Intimate Partner Violence: Not on file    Review of Systems: Gen: Denies any fever, chills, cold or flulike symptoms, presyncope, syncope. CV: Denies chest pain, heart palpitations. Resp: Denies shortness of breath, cough. GI: See HPI GU : Denies urinary burning, urinary frequency, urinary hesitancy MS: Denies joint pain. Derm: Denies rash. Psych: Denies depression, anxiety. Heme: See HPI  Physical Exam: There were no vitals taken for this visit. General:   Alert and oriented. Pleasant and cooperative. Well-nourished and well-developed.  Head:  Normocephalic and atraumatic. Eyes:  Without icterus, sclera clear and conjunctiva pink.  Ears:  Normal auditory acuity. Lungs:  Clear to auscultation  bilaterally. No wheezes, rales, or rhonchi. No distress.  Heart:  S1, S2 present without murmurs appreciated.  Abdomen:  +BS, soft, non-tender and non-distended. No HSM noted. No guarding or rebound. No masses appreciated.  Rectal:  Deferred  Msk:  Symmetrical without gross deformities. Normal posture. Extremities:  Without edema. Neurologic:  Alert and  oriented x4;  grossly normal neurologically. Skin:  Intact without significant lesions or rashes. Psych:  Normal mood and affect.    Assessment:     Plan:  ***   Aliene Altes, PA-C [facility] 04/30/2022

## 2022-04-30 ENCOUNTER — Encounter: Payer: Self-pay | Admitting: Gastroenterology

## 2022-04-30 ENCOUNTER — Telehealth: Payer: Self-pay | Admitting: *Deleted

## 2022-04-30 ENCOUNTER — Ambulatory Visit: Payer: Medicare HMO | Admitting: Gastroenterology

## 2022-04-30 VITALS — BP 135/68 | HR 80 | Temp 97.7°F | Ht 70.0 in | Wt 252.2 lb

## 2022-04-30 DIAGNOSIS — Z86010 Personal history of colonic polyps: Secondary | ICD-10-CM

## 2022-04-30 DIAGNOSIS — R109 Unspecified abdominal pain: Secondary | ICD-10-CM | POA: Diagnosis not present

## 2022-04-30 DIAGNOSIS — R55 Syncope and collapse: Secondary | ICD-10-CM | POA: Diagnosis not present

## 2022-04-30 NOTE — Telephone Encounter (Signed)
Sent cardiac clearance for procedure. Waiting on approval.

## 2022-04-30 NOTE — Telephone Encounter (Signed)
Attention: Preop   We would like to request cardiac clearance for the following patient please.  Procedure: Colonoscopy   Date: TBD  Surgeon:  Dr. Gala Romney   Phone: [phone]  Fax:  [phone]  Type of Anesthesia: Propofol  ASA III

## 2022-04-30 NOTE — Patient Instructions (Signed)
We will arrange you to have a colonoscopy in the near future with Dr. Gala Romney. We will reach out to your cardiologist to get clearance pending your office visit with them on 7/21.  Due to your history of diverticulosis and mild intermittent constipation, I recommend you start Benefiber 2 teaspoons daily.    Monitor for recurrent left-sided abdominal pain and let me know if any persistent pain or severe pain and we can arrange a CT scan for you.  We will follow-up with you after your colonoscopy as Dr. Gala Romney recommends.  Do not hesitate to call if you have any new GI concerns.  It was very nice meeting you today!  Aliene Altes, PA-C [facility] Gastroenterology

## 2022-04-30 NOTE — Telephone Encounter (Signed)
   Patient Name: KERON NEENAN  DOB: 19-Jun-1951 MRN: [id number]  Primary Cardiologist: Carlyle Dolly, MD  Chart reviewed as part of pre-operative protocol coverage.  Patient has an upcoming visit scheduled with Dr. Harl Bowie on 05/16/2022 at which time clearance can be addressed in case there are any issues that would impact surgical recommendations.  Colonoscopy date TBD as below.  I added preop FYI to appointment notes that provider is aware to address at time of outpatient visit.  Per office protocol the cardiology provider should for their finalize clearance decision and recommendations regarding antiplatelet therapy to requesting party below.  I will route this message as FYI to requesting party and remove this message from the pre-op box as separate preop APP input not needed at this time.   Please call with any questions.   Lenna Sciara, NP 04/30/2022, 3:35 PM

## 2022-05-05 ENCOUNTER — Telehealth: Payer: Self-pay | Admitting: *Deleted

## 2022-05-05 NOTE — Telephone Encounter (Signed)
Received letter from cardiologist stating that pt has up coming [facility] on 7/21 and will decide then on colonoscopy.

## 2022-05-05 NOTE — Telephone Encounter (Signed)
Noted  

## 2022-05-12 DIAGNOSIS — Z8639 Personal history of other endocrine, nutritional and metabolic disease: Secondary | ICD-10-CM | POA: Diagnosis not present

## 2022-05-14 DIAGNOSIS — E1122 Type 2 diabetes mellitus with diabetic chronic kidney disease: Secondary | ICD-10-CM | POA: Diagnosis not present

## 2022-05-14 DIAGNOSIS — N184 Chronic kidney disease, stage 4 (severe): Secondary | ICD-10-CM | POA: Diagnosis not present

## 2022-05-16 ENCOUNTER — Encounter: Payer: Self-pay | Admitting: Cardiology

## 2022-05-16 ENCOUNTER — Ambulatory Visit: Payer: Medicare HMO | Admitting: Cardiology

## 2022-05-16 VITALS — BP 108/62 | HR 73 | Ht 70.0 in | Wt 257.0 lb

## 2022-05-16 DIAGNOSIS — R002 Palpitations: Secondary | ICD-10-CM | POA: Diagnosis not present

## 2022-05-16 DIAGNOSIS — I25119 Atherosclerotic heart disease of native coronary artery with unspecified angina pectoris: Secondary | ICD-10-CM | POA: Diagnosis not present

## 2022-05-16 DIAGNOSIS — E782 Mixed hyperlipidemia: Secondary | ICD-10-CM

## 2022-05-16 DIAGNOSIS — G4733 Obstructive sleep apnea (adult) (pediatric): Secondary | ICD-10-CM | POA: Diagnosis not present

## 2022-05-16 NOTE — Patient Instructions (Addendum)
Medication Instructions:  Call the office to confirm the Norvasc (Amlodipine) Continue all other medications.     Labwork: none  Testing/Procedures: none  Follow-Up: 3 months   Any Other Special Instructions Will Be Listed Below (If Applicable). You have been referred to:  [facility]   If you need a refill on your cardiac medications before your next appointment, please call your pharmacy.

## 2022-05-16 NOTE — Progress Notes (Signed)
Clinical Summary Brandon Warren is a 71 y.o.male seen today for follow up of the following medical problems.   1. Palpitations - feeling of heart racing at times, associated with pressure upper midchest.  - HR up to 140 with pulse ox. Can last up to 10 min. Some lightheadedness. Sudden onset and termination - few cups of coffee a daily, rare EtOH.    - 06/2020 event monitor benign, just rare PVCs     More recent monitor with episodes of SVT, no significant brady episodes -rare infrequent palpitations. He is on coreg and diltiazem   2. HTN/episode of hypotension - recent episode syncope, dizziness in setting of dehydration 02/2022 - evaluted by EMS, hypotensive.Had been sick few weeks with URI, cough. Was also on aggressive diet, had lost 20-30 lbs.    - low bp's recently, nephrology lowered norvasc to '5mg'$  daily - has had signifcant weight loss.  -submitted bp log late June that looked fine   - taking diltiazem and norvasc it appears - worsking to stay hydrated      3. OSA - has cpap, does not have a doctor following - got cpap 4 years ago.  - needs to establish with pulmonary for f/u. Was not able to last referal due to sudden health issues with his wife.    - reports he is not using cpap after weight loss     4. . CAD - 2005 PCI to OM with DES - cath 02/2018 with 20% LM, 20% prox LCX with a widely patent stent - no recent chest pains.  - resports some low endurance, fatigue. Reports very sedentary during panedmic   5. Hyperlipidemia - muscle cramps on lipitor, zocor previously - has been on zetia, 03/2020 LDL 95 - labs followe dby pcp  11/2021 TC 206 HDL 36 TG 451 - endo started crestor, has been able to tolerate '5mg'$  daily.    6. CKD  - followed by Dr Lissa Morales: wife with recent complex hernia surgery, complicated by sepsis and needed repeat surgery. She is doing well now.  Daughter is nurse ansethesist at group.   Past Medical History:  Diagnosis  Date   Anemia    Arthritis    Back pain    BPH (benign prostatic hyperplasia)    CAD S/P percutaneous coronary angioplasty    a. 2005: PCI OM - Zomax study stent DES  2.5 x16 mm. b. cath in 02/2018 showing 20% ISR of LCx stent and 20% stenosis along mid-distal LM.    CKD (chronic kidney disease)    Controlled type 2 diabetes with neuropathy (HCC)    Diastolic heart failure (HCC)    Grade 1   Edema of both lower extremities    Food allergy    Gout    Hypercholesterolemia    Hypertension    Hypothyroidism    Kidney disease    MI (myocardial infarction) ([place])    in his 20s   Sleep apnea    SOB (shortness of breath)      Allergies  Allergen Reactions   Meat Extract Diarrhea, Nausea Only and Other (See Comments)    alphagal reaction   Lipitor [Atorvastatin] Other (See Comments)    Muscle cramps   Simvastatin Other (See Comments)    Muscle cramps    Allopurinol Rash     Current Outpatient Medications  Medication Sig Dispense Refill   acetaminophen (TYLENOL) 500 MG tablet Take 500 mg by  mouth 2 (two) times daily as needed (for pain.).     ALPRAZolam (XANAX) 0.5 MG tablet Take 0.5 mg by mouth daily as needed for anxiety.     aspirin EC 81 MG tablet Take 81 mg by mouth every evening.     bromocriptine (PARLODEL) 2.5 MG tablet Take 0.5 tablets (1.25 mg total) by mouth at bedtime. 45 tablet 3   carvedilol (COREG) 25 MG tablet TAKE ONE AND ONE-HALF TABLET (37.'5MG'$  TOTAL) BY MOUTH TWO TIMES A DAY 270 tablet 3   Cholecalciferol (VITAMIN D3) 5000 units CAPS Take 1 capsule (5,000 Units total) by mouth daily. 90 capsule 0   diazepam (VALIUM) 10 MG tablet Take 10 mg by mouth daily as needed (muscle spasms).     diltiazem (CARDIZEM) 30 MG tablet Take 1 tablet (30 mg total) by mouth 2 (two) times daily. 180 tablet 3   EPINEPHrine 0.3 mg/0.3 mL IJ SOAJ injection Inject 0.3 mg into the muscle as needed for anaphylaxis.     ezetimibe (ZETIA) 10 MG tablet Take 10 mg by mouth at bedtime.      Febuxostat 80 MG TABS TAKE ONE TABLET (80 MG TOTAL) BY MOUTH DAILY. 30 tablet 3   ferrous sulfate 325 (65 FE) MG tablet Take 325 mg by mouth 3 (three) times daily.     finasteride (PROSCAR) 5 MG tablet Take 5 mg by mouth daily.      hydrALAZINE (APRESOLINE) 100 MG tablet TAKE ONE TABLET ('100MG'$  TOTAL) BY MOUTH THREE TIMES DAILY 270 tablet 3   levothyroxine (SYNTHROID) 25 MCG tablet TAKE ONE TABLET (25MCG TOTAL) BY MOUTH DAILY BEFORE BREAKFAST. TAKE ALONG WITH 200MCG FOR TOTAL DOSE OF 225MCG. (Patient taking differently: Take 25 mcg by mouth daily before breakfast. Take with 200 mcg to equal 225 mcg daily) 90 tablet 0   levothyroxine (SYNTHROID, LEVOTHROID) 200 MCG tablet TAKE ONE TABLET BY MOUTH EVERY DAY BEFORE BREAKFAST (Patient taking differently: Take 200 mcg by mouth daily before breakfast. Take with 25 mcg to equal 225 mcg daily) 90 tablet 0   losartan (COZAAR) 100 MG tablet TAKE ONE TABLET ('100MG'$  TOTAL) BY MOUTH DAILY 7 tablet 0   MITIGARE 0.6 MG CAPS Take 0.6 mg by mouth 2 (two) times daily as needed (gout flare).     omega-3 acid ethyl esters (LOVAZA) 1 g capsule TAKE ONE CAPSULE BY MOUTH EVERY 12 HOURS 7 capsule 0   OVER THE COUNTER MEDICATION Apply 1 application topically daily as needed (rash). Terrasil cream (Patient not taking: Reported on 12/11/2021)     OVER THE COUNTER MEDICATION Apply 1 application topically daily as needed (itching). E45 itch relief cream (Patient not taking: Reported on 12/11/2021)     Semaglutide, 2 MG/DOSE, (OZEMPIC, 2 MG/DOSE,) 8 MG/3ML SOPN Inject into the skin.     sodium bicarbonate 650 MG tablet Take 650 mg by mouth 3 (three) times daily.     tamsulosin (FLOMAX) 0.4 MG CAPS capsule TAKE ONE (1) CAPSULE BY MOUTH TWICE A DAY. (EVERY 12 HOURS.) 180 capsule 11   TRADJENTA 5 MG TABS tablet Take 5 mg by mouth daily.     triamcinolone ointment (KENALOG) 0.5 % Apply 1 application topically 2 (two) times daily as needed. (Patient not taking: Reported on 12/11/2021) 30 g  0   No current facility-administered medications for this visit.     Past Surgical History:  Procedure Laterality Date   APPENDECTOMY     CATARACT EXTRACTION Left    COLONOSCOPY N/A 02/01/2014   Procedure:  COLONOSCOPY;  Surgeon: Daneil Dolin, MD;  [facility];  Service: Endoscopy;  Laterality: N/A;  8:30   CORONARY ANGIOPLASTY WITH STENT PLACEMENT  2005   PCI- OM1 Zomax study stent 2.5 x16 mm.   LEFT HEART CATH AND CORONARY ANGIOGRAPHY N/A 03/15/2018   Procedure: LEFT HEART CATH AND CORONARY ANGIOGRAPHY;  Surgeon: Leonie Man, MD;  Location: [place] [facility];  Service: Cardiovascular;  Laterality: N/A;     Allergies  Allergen Reactions   Meat Extract Diarrhea, Nausea Only and Other (See Comments)    alphagal reaction   Lipitor [Atorvastatin] Other (See Comments)    Muscle cramps   Simvastatin Other (See Comments)    Muscle cramps    Allopurinol Rash      Family History  Problem Relation Age of Onset   Colon polyps Mother    Hypertension Mother    High Cholesterol Father    Heart disease Father    COPD Father    Thyroid disease Daughter    Healthy Daughter    Healthy Other    Colon cancer Neg Hx      Social History Mr. Plessinger reports that he quit smoking about 18 years ago. His smoking use included cigarettes. He has a 60.00 pack-year smoking history. He has never used smokeless tobacco. Mr. Rittenhouse reports current alcohol use.   Review of Systems CONSTITUTIONAL: No weight loss, fever, chills, weakness or fatigue.  HEENT: Eyes: No visual loss, blurred vision, double vision or yellow sclerae.No hearing loss, sneezing, congestion, runny nose or sore throat.  SKIN: No rash or itching.  CARDIOVASCULAR: per hpi RESPIRATORY: No shortness of breath, cough or sputum.  GASTROINTESTINAL: No anorexia, nausea, vomiting or diarrhea. No abdominal pain or blood.  GENITOURINARY: No burning on urination, no polyuria NEUROLOGICAL: No headache, dizziness,  syncope, paralysis, ataxia, numbness or tingling in the extremities. No change in bowel or bladder control.  MUSCULOSKELETAL: No muscle, back pain, joint pain or stiffness.  LYMPHATICS: No enlarged nodes. No history of splenectomy.  PSYCHIATRIC: No history of depression or anxiety.  ENDOCRINOLOGIC: No reports of sweating, cold or heat intolerance. No polyuria or polydipsia.  Marland Kitchen   Physical Examination Today's Vitals   05/16/22 1043  BP: 108/62  Pulse: 73  SpO2: 95%  Weight: 257 lb (116.6 kg)  Height: '5\' 10"'$  (1.778 m)   Body mass index is 36.88 kg/m.  Gen: resting comfortably, no acute distress HEENT: no scleral icterus, pupils equal round and reactive, no palptable cervical adenopathy,  CV: RRR, no m/r/g no jvd Resp: Clear to auscultation bilaterally GI: abdomen is soft, non-tender, non-distended, normal bowel sounds, no hepatosplenomegaly MSK: extremities are warm, no edema.  Skin: warm, no rash Neuro:  no focal deficits Psych: appropriate affect   Diagnostic Studies     Assessment and Plan  1. Palpitations -  recent monitor with episodes of SVT, no significant brady episodes - he is on coreg 37.'5mg'$  bid, dilt '30mg'$  bid.   2. HTN - bp at goal - Appears may still be taking norvac, he will call us from home and verify if so then d/c norvasc.     3. OSA - refer to pulmonary   4. CAD - no symptmos, continue current meds   5. Hyperlipidemia -  tolerating crestor, continue     Arnoldo Lenis, M.D.

## 2022-05-19 NOTE — Telephone Encounter (Signed)
Is pt approved to have procedure?

## 2022-05-19 NOTE — Telephone Encounter (Signed)
Thanks for confirming with [place] about the amlodopine, correct in discontinuing it   Zandra Abts MD

## 2022-05-20 NOTE — Telephone Encounter (Signed)
Fine for colonoscopy  Zandra Abts MD

## 2022-05-20 NOTE — Telephone Encounter (Signed)
   Patient Name: Brandon Warren  DOB: 12/31/1950 MRN: [id number]  Primary Cardiologist: Carlyle Dolly, MD  Chart reviewed as part of pre-operative protocol coverage. Per Dr. Harl Bowie, primary cardiologist, given past medical history and time since last visit, and based on ACC/AHA guidelines, CASTEN FLOREN would be at acceptable risk for the planned procedure without further cardiovascular testing.   I will route this recommendation to the requesting party via Epic fax function and remove from pre-op pool.  Please call with questions.  Lenna Sciara, NP 05/20/2022, 12:46 PM

## 2022-05-20 NOTE — Telephone Encounter (Signed)
Received letter stated that pt approved for procedure. Copy placed on providers desk.

## 2022-05-21 NOTE — Telephone Encounter (Signed)
Please proceed with scheduling colonoscopy as planned. ASA 3. Hold iron x7 days prior to procedure.  1 day prior to procedure: Take diabetes medications as prescribed. Day of procedure: Do not take any morning diabetes medications.

## 2022-05-21 NOTE — Telephone Encounter (Signed)
Called pt to schedule. He is going on vacation last few weeks of august. Wants procedure in September. Will call once we have that schedule

## 2022-05-22 DIAGNOSIS — G4733 Obstructive sleep apnea (adult) (pediatric): Secondary | ICD-10-CM | POA: Diagnosis not present

## 2022-05-22 DIAGNOSIS — I2510 Atherosclerotic heart disease of native coronary artery without angina pectoris: Secondary | ICD-10-CM | POA: Diagnosis not present

## 2022-05-22 DIAGNOSIS — I129 Hypertensive chronic kidney disease with stage 1 through stage 4 chronic kidney disease, or unspecified chronic kidney disease: Secondary | ICD-10-CM | POA: Diagnosis not present

## 2022-05-22 DIAGNOSIS — E785 Hyperlipidemia, unspecified: Secondary | ICD-10-CM | POA: Diagnosis not present

## 2022-05-22 DIAGNOSIS — E669 Obesity, unspecified: Secondary | ICD-10-CM | POA: Diagnosis not present

## 2022-05-22 DIAGNOSIS — E1122 Type 2 diabetes mellitus with diabetic chronic kidney disease: Secondary | ICD-10-CM | POA: Diagnosis not present

## 2022-05-22 DIAGNOSIS — E039 Hypothyroidism, unspecified: Secondary | ICD-10-CM | POA: Diagnosis not present

## 2022-05-22 DIAGNOSIS — M5459 Other low back pain: Secondary | ICD-10-CM | POA: Diagnosis not present

## 2022-05-22 DIAGNOSIS — N184 Chronic kidney disease, stage 4 (severe): Secondary | ICD-10-CM | POA: Diagnosis not present

## 2022-05-28 ENCOUNTER — Encounter: Payer: Self-pay | Admitting: *Deleted

## 2022-05-28 ENCOUNTER — Other Ambulatory Visit: Payer: Self-pay | Admitting: *Deleted

## 2022-05-28 NOTE — Patient Outreach (Signed)
  Care Coordination   Initial Visit Note   05/28/2022 Name: HYLAND MOLLENKOPF MRN: [id number] DOB: 01/16/1951  JAMIER URBAS is a 71 y.o. year old male who sees Redmond School, MD for primary care. I spoke with  Merlyn Albert by phone today  What matters to the patients health and wellness today?  Cost of Ozempic ($250/month) and Libre ($350/month) monitoring system    Goals Addressed               This Visit's Progress     Decrease cost of Ozempic and Libre monitoring system (pt-stated)        Care Coordination Interventions: Counseled on importance of regular laboratory monitoring as prescribed Reviewed scheduled/upcoming provider appointments including: need for AWV, orders for labs (A1C) Referral made to pharmacy team for assistance with Ozempic and Gerty system         SDOH assessments and interventions completed:  Yes     Care Coordination Interventions Activated:  Yes  Care Coordination Interventions:  Yes, provided   Follow up plan: Referral made to pharmacy team    Encounter Outcome:  Pt. Visit Completed   Valente David, RN, MSN, [place] Coordinator [phone]

## 2022-06-03 DIAGNOSIS — E1122 Type 2 diabetes mellitus with diabetic chronic kidney disease: Secondary | ICD-10-CM | POA: Diagnosis not present

## 2022-06-03 DIAGNOSIS — E221 Hyperprolactinemia: Secondary | ICD-10-CM | POA: Diagnosis not present

## 2022-06-03 DIAGNOSIS — Z6837 Body mass index (BMI) 37.0-37.9, adult: Secondary | ICD-10-CM | POA: Diagnosis not present

## 2022-06-03 DIAGNOSIS — E785 Hyperlipidemia, unspecified: Secondary | ICD-10-CM | POA: Diagnosis not present

## 2022-06-03 DIAGNOSIS — N184 Chronic kidney disease, stage 4 (severe): Secondary | ICD-10-CM | POA: Diagnosis not present

## 2022-06-03 DIAGNOSIS — E039 Hypothyroidism, unspecified: Secondary | ICD-10-CM | POA: Diagnosis not present

## 2022-06-03 NOTE — Telephone Encounter (Signed)
Called pt, no answer and VM Full

## 2022-06-04 ENCOUNTER — Encounter (INDEPENDENT_AMBULATORY_CARE_PROVIDER_SITE_OTHER): Payer: Self-pay

## 2022-06-09 ENCOUNTER — Telehealth: Payer: Medicare HMO | Admitting: Internal Medicine

## 2022-06-09 NOTE — Telephone Encounter (Signed)
Pt said he had OV with [facility] on 7/5 and has been cleared by his cardiologist to schedule procedure. Does he need OV prior to scheduling or can he be scheduled procedure? Please advise [phone]

## 2022-06-09 NOTE — Telephone Encounter (Signed)
See prior note. I have tried calling pt to schedule. Called pt no answer and not able to leave  VM

## 2022-06-11 ENCOUNTER — Encounter: Payer: Self-pay | Admitting: *Deleted

## 2022-06-11 MED ORDER — PEG 3350-KCL-NA BICARB-NACL 420 G PO SOLR: 4000.0000 mL | Freq: Once | ORAL | 0 refills | Status: AC

## 2022-06-11 NOTE — Telephone Encounter (Signed)
Patient has been scheduled for Friday 07/18/22 for colonoscopy. Instructions and pre-op appointment will be mailed out to pt. Prep has been sent to pharmacy.

## 2022-06-12 NOTE — Telephone Encounter (Signed)
PA approved via cohere. Authorization #[id number], DOS: 07/18/2022 - 10/16/2022

## 2022-06-16 DIAGNOSIS — N041 Nephrotic syndrome with focal and segmental glomerular lesions: Secondary | ICD-10-CM | POA: Diagnosis not present

## 2022-06-16 DIAGNOSIS — N189 Chronic kidney disease, unspecified: Secondary | ICD-10-CM | POA: Diagnosis not present

## 2022-06-16 DIAGNOSIS — I129 Hypertensive chronic kidney disease with stage 1 through stage 4 chronic kidney disease, or unspecified chronic kidney disease: Secondary | ICD-10-CM | POA: Diagnosis not present

## 2022-06-16 DIAGNOSIS — E1122 Type 2 diabetes mellitus with diabetic chronic kidney disease: Secondary | ICD-10-CM | POA: Diagnosis not present

## 2022-06-16 DIAGNOSIS — E8722 Chronic metabolic acidosis: Secondary | ICD-10-CM | POA: Diagnosis not present

## 2022-06-16 DIAGNOSIS — R808 Other proteinuria: Secondary | ICD-10-CM | POA: Diagnosis not present

## 2022-06-16 DIAGNOSIS — I5032 Chronic diastolic (congestive) heart failure: Secondary | ICD-10-CM | POA: Diagnosis not present

## 2022-06-18 DIAGNOSIS — I5032 Chronic diastolic (congestive) heart failure: Secondary | ICD-10-CM | POA: Diagnosis not present

## 2022-06-18 DIAGNOSIS — R808 Other proteinuria: Secondary | ICD-10-CM | POA: Diagnosis not present

## 2022-06-18 DIAGNOSIS — E8722 Chronic metabolic acidosis: Secondary | ICD-10-CM | POA: Diagnosis not present

## 2022-06-18 DIAGNOSIS — N189 Chronic kidney disease, unspecified: Secondary | ICD-10-CM | POA: Diagnosis not present

## 2022-06-18 DIAGNOSIS — E1122 Type 2 diabetes mellitus with diabetic chronic kidney disease: Secondary | ICD-10-CM | POA: Diagnosis not present

## 2022-06-18 DIAGNOSIS — N041 Nephrotic syndrome with focal and segmental glomerular lesions: Secondary | ICD-10-CM | POA: Diagnosis not present

## 2022-06-18 DIAGNOSIS — I129 Hypertensive chronic kidney disease with stage 1 through stage 4 chronic kidney disease, or unspecified chronic kidney disease: Secondary | ICD-10-CM | POA: Diagnosis not present

## 2022-07-14 NOTE — Patient Instructions (Signed)
Brandon Warren  07/14/2022     '@PREFPERIOPPHARMACY'$ @   Your procedure is scheduled on  07/18/2022.   Report to [facility] at  [place].M.   Call this number if you have problems the morning of surgery:  [phone]   Remember:        Follow the diet and prep instructions given to you by the office.         Your last dose of semiglutide should be 07/11/2022 or before.     Take these medicines the morning of surgery with A SIP OF WATER       xanax(if needed), coreg, valium, cardiazem, febuxostat, synthroid.     Do not wear jewelry, make-up or nail polish.  Do not wear lotions, powders, or perfumes, or deodorant.  Do not shave 48 hours prior to surgery.  Men may shave face and neck.  Do not bring valuables to the hospital.  [facility] is not responsible for any belongings or valuables.  Contacts, dentures or bridgework may not be worn into surgery.  Leave your suitcase in the car.  After surgery it may be brought to your room.  For patients admitted to the hospital, discharge time will be determined by your treatment team.  Patients discharged the day of surgery will not be allowed to drive home and must have someone with them for 24 hours.     Special instructions:   DO NOT smoke tobacco or vape for 24 hours before your procedure.  Please read over the following fact sheets that you were given. Anesthesia Post-op Instructions and Care and Recovery After Surgery      Colonoscopy, Adult, Care After The following information offers guidance on how to care for yourself after your procedure. Your health care provider may also give you more specific instructions. If you have problems or questions, contact your health care provider. What can I expect after the procedure? After the procedure, it is common to have: A small amount of blood in your stool for 24 hours after the procedure. Some gas. Mild cramping or bloating of your abdomen. Follow these instructions  at home: Eating and drinking  Drink enough fluid to keep your urine pale yellow. Follow instructions from your health care provider about eating or drinking restrictions. Resume your normal diet as told by your health care provider. Avoid heavy or fried foods that are hard to digest. Activity Rest as told by your health care provider. Avoid sitting for a long time without moving. Get up to take short walks every 1-2 hours. This is important to improve blood flow and breathing. Ask for help if you feel weak or unsteady. Return to your normal activities as told by your health care provider. Ask your health care provider what activities are safe for you. Managing cramping and bloating  Try walking around when you have cramps or feel bloated. If directed, apply heat to your abdomen as told by your health care provider. Use the heat source that your health care provider recommends, such as a moist heat pack or a heating pad. Place a towel between your skin and the heat source. Leave the heat on for 20-30 minutes. Remove the heat if your skin turns bright red. This is especially important if you are unable to feel pain, heat, or cold. You have a greater risk of getting burned. General instructions If you were given a sedative during the procedure, it can affect you  for several hours. Do not drive or operate machinery until your health care provider says that it is safe. For the first 24 hours after the procedure: Do not sign important documents. Do not drink alcohol. Do your regular daily activities at a slower pace than normal. Eat soft foods that are easy to digest. Take over-the-counter and prescription medicines only as told by your health care provider. Keep all follow-up visits. This is important. Contact a health care provider if: You have blood in your stool 2-3 days after the procedure. Get help right away if: You have more than a small spotting of blood in your stool. You have large  blood clots in your stool. You have swelling of your abdomen. You have nausea or vomiting. You have a fever. You have increasing pain in your abdomen that is not relieved with medicine. These symptoms may be an emergency. Get help right away. Call 911. Do not wait to see if the symptoms will go away. Do not drive yourself to the hospital. Summary After the procedure, it is common to have a small amount of blood in your stool. You may also have mild cramping and bloating of your abdomen. If you were given a sedative during the procedure, it can affect you for several hours. Do not drive or operate machinery until your health care provider says that it is safe. Get help right away if you have a lot of blood in your stool, nausea or vomiting, a fever, or increased pain in your abdomen. This information is not intended to replace advice given to you by your health care provider. Make sure you discuss any questions you have with your health care provider. Document Revised: 06/05/2021 Document Reviewed: 06/05/2021 Elsevier Patient Education  [place] After This sheet gives you information about how to care for yourself after your procedure. Your health care provider may also give you more specific instructions. If you have problems or questions, contact your health care provider. What can I expect after the procedure? After the procedure, it is common to have: Tiredness. Forgetfulness about what happened after the procedure. Impaired judgment for important decisions. Nausea or vomiting. Some difficulty with balance. Follow these instructions at home: For the time period you were told by your health care provider:     Rest as needed. Do not participate in activities where you could fall or become injured. Do not drive or use machinery. Do not drink alcohol. Do not take sleeping pills or medicines that cause drowsiness. Do not make important  decisions or sign legal documents. Do not take care of children on your own. Eating and drinking Follow the diet that is recommended by your health care provider. Drink enough fluid to keep your urine pale yellow. If you vomit: Drink water, juice, or soup when you can drink without vomiting. Make sure you have little or no nausea before eating solid foods. General instructions Have a responsible adult stay with you for the time you are told. It is important to have someone help care for you until you are awake and alert. Take over-the-counter and prescription medicines only as told by your health care provider. If you have sleep apnea, surgery and certain medicines can increase your risk for breathing problems. Follow instructions from your health care provider about wearing your sleep device: Anytime you are sleeping, including during daytime naps. While taking prescription pain medicines, sleeping medicines, or medicines that make you drowsy. Avoid smoking. Keep all  follow-up visits as told by your health care provider. This is important. Contact a health care provider if: You keep feeling nauseous or you keep vomiting. You feel light-headed. You are still sleepy or having trouble with balance after 24 hours. You develop a rash. You have a fever. You have redness or swelling around the IV site. Get help right away if: You have trouble breathing. You have new-onset confusion at home. Summary For several hours after your procedure, you may feel tired. You may also be forgetful and have poor judgment. Have a responsible adult stay with you for the time you are told. It is important to have someone help care for you until you are awake and alert. Rest as told. Do not drive or operate machinery. Do not drink alcohol or take sleeping pills. Get help right away if you have trouble breathing, or if you suddenly become confused. This information is not intended to replace advice given to you  by your health care provider. Make sure you discuss any questions you have with your health care provider. Document Revised: 09/17/2021 Document Reviewed: 09/15/2019 Elsevier Patient Education  [place].

## 2022-07-15 ENCOUNTER — Telehealth: Payer: Self-pay | Admitting: *Deleted

## 2022-07-15 ENCOUNTER — Encounter (HOSPITAL_COMMUNITY)
Admission: RE | Admit: 2022-07-15 | Discharge: 2022-07-15 | Disposition: A | Discharge status: Home / Self Care | Payer: Medicare HMO | Source: Ambulatory Visit | Attending: Internal Medicine | Admitting: Internal Medicine

## 2022-07-15 ENCOUNTER — Encounter (HOSPITAL_COMMUNITY): Payer: Self-pay

## 2022-07-15 DIAGNOSIS — E1169 Type 2 diabetes mellitus with other specified complication: Secondary | ICD-10-CM

## 2022-07-15 DIAGNOSIS — I129 Hypertensive chronic kidney disease with stage 1 through stage 4 chronic kidney disease, or unspecified chronic kidney disease: Secondary | ICD-10-CM

## 2022-07-15 NOTE — Telephone Encounter (Signed)
Received VM from pt to reschedule procedure on for Friday 9/22. Reports having covid symptoms and wants to reschedule for November.  Called pt back, LMOVM will call once we get that schedule

## 2022-07-16 ENCOUNTER — Institutional Professional Consult (permissible substitution): Payer: Medicare HMO | Admitting: Pulmonary Disease

## 2022-07-18 ENCOUNTER — Encounter (HOSPITAL_COMMUNITY): Admission: RE | Payer: Self-pay | Source: Home / Self Care

## 2022-07-18 ENCOUNTER — Ambulatory Visit (HOSPITAL_COMMUNITY): Admission: RE | Admit: 2022-07-18 | Payer: Medicare HMO | Source: Home / Self Care | Admitting: Internal Medicine

## 2022-07-18 SURGERY — COLONOSCOPY WITH PROPOFOL
Anesthesia: Monitor Anesthesia Care

## 2022-07-30 ENCOUNTER — Telehealth: Payer: Self-pay | Admitting: *Deleted

## 2022-07-30 NOTE — Telephone Encounter (Signed)
LMOVM to call back to schedule TCS with Dr. Gala Romney, asa 3  PA still valid from prior approval

## 2022-08-12 ENCOUNTER — Encounter: Payer: Self-pay | Admitting: *Deleted

## 2022-08-12 MED ORDER — PEG 3350-KCL-NA BICARB-NACL 420 G PO SOLR: 4000.0000 mL | Freq: Once | ORAL | 0 refills | Status: AC

## 2022-08-12 NOTE — Telephone Encounter (Signed)
Spoke with pt and he has been scheduled for 10/17 at 9a,./ aware will mail instructions/pre-op appt. Rx sent to pharmacy for prep.

## 2022-08-12 NOTE — Telephone Encounter (Signed)
PA approved via [employer]here. Authorization #[id number], DOS: 07/18/2022 - 10/16/2022

## 2022-08-18 DIAGNOSIS — B351 Tinea unguium: Secondary | ICD-10-CM | POA: Diagnosis not present

## 2022-08-18 DIAGNOSIS — E1140 Type 2 diabetes mellitus with diabetic neuropathy, unspecified: Secondary | ICD-10-CM | POA: Diagnosis not present

## 2022-08-18 DIAGNOSIS — I129 Hypertensive chronic kidney disease with stage 1 through stage 4 chronic kidney disease, or unspecified chronic kidney disease: Secondary | ICD-10-CM | POA: Diagnosis not present

## 2022-08-18 DIAGNOSIS — N189 Chronic kidney disease, unspecified: Secondary | ICD-10-CM | POA: Diagnosis not present

## 2022-08-18 DIAGNOSIS — E8722 Chronic metabolic acidosis: Secondary | ICD-10-CM | POA: Diagnosis not present

## 2022-08-18 DIAGNOSIS — E1122 Type 2 diabetes mellitus with diabetic chronic kidney disease: Secondary | ICD-10-CM | POA: Diagnosis not present

## 2022-08-18 DIAGNOSIS — R808 Other proteinuria: Secondary | ICD-10-CM | POA: Diagnosis not present

## 2022-08-18 DIAGNOSIS — N041 Nephrotic syndrome with focal and segmental glomerular lesions: Secondary | ICD-10-CM | POA: Diagnosis not present

## 2022-08-18 DIAGNOSIS — I5032 Chronic diastolic (congestive) heart failure: Secondary | ICD-10-CM | POA: Diagnosis not present

## 2022-08-18 DIAGNOSIS — L600 Ingrowing nail: Secondary | ICD-10-CM | POA: Diagnosis not present

## 2022-08-18 DIAGNOSIS — L03031 Cellulitis of right toe: Secondary | ICD-10-CM | POA: Diagnosis not present

## 2022-08-25 DIAGNOSIS — I5032 Chronic diastolic (congestive) heart failure: Secondary | ICD-10-CM | POA: Diagnosis not present

## 2022-08-25 DIAGNOSIS — N041 Nephrotic syndrome with focal and segmental glomerular lesions: Secondary | ICD-10-CM | POA: Diagnosis not present

## 2022-08-25 DIAGNOSIS — Z6838 Body mass index (BMI) 38.0-38.9, adult: Secondary | ICD-10-CM | POA: Diagnosis not present

## 2022-08-25 DIAGNOSIS — N189 Chronic kidney disease, unspecified: Secondary | ICD-10-CM | POA: Diagnosis not present

## 2022-08-25 DIAGNOSIS — D631 Anemia in chronic kidney disease: Secondary | ICD-10-CM | POA: Diagnosis not present

## 2022-08-25 DIAGNOSIS — I129 Hypertensive chronic kidney disease with stage 1 through stage 4 chronic kidney disease, or unspecified chronic kidney disease: Secondary | ICD-10-CM | POA: Diagnosis not present

## 2022-08-25 DIAGNOSIS — I130 Hypertensive heart and chronic kidney disease with heart failure and stage 1 through stage 4 chronic kidney disease, or unspecified chronic kidney disease: Secondary | ICD-10-CM | POA: Diagnosis not present

## 2022-08-25 DIAGNOSIS — E669 Obesity, unspecified: Secondary | ICD-10-CM | POA: Diagnosis not present

## 2022-08-25 DIAGNOSIS — D638 Anemia in other chronic diseases classified elsewhere: Secondary | ICD-10-CM | POA: Diagnosis not present

## 2022-08-25 DIAGNOSIS — R808 Other proteinuria: Secondary | ICD-10-CM | POA: Diagnosis not present

## 2022-08-25 DIAGNOSIS — N184 Chronic kidney disease, stage 4 (severe): Secondary | ICD-10-CM | POA: Diagnosis not present

## 2022-08-25 DIAGNOSIS — E1122 Type 2 diabetes mellitus with diabetic chronic kidney disease: Secondary | ICD-10-CM | POA: Diagnosis not present

## 2022-09-08 DIAGNOSIS — N189 Chronic kidney disease, unspecified: Secondary | ICD-10-CM | POA: Diagnosis not present

## 2022-09-08 DIAGNOSIS — I129 Hypertensive chronic kidney disease with stage 1 through stage 4 chronic kidney disease, or unspecified chronic kidney disease: Secondary | ICD-10-CM | POA: Diagnosis not present

## 2022-09-08 DIAGNOSIS — R808 Other proteinuria: Secondary | ICD-10-CM | POA: Diagnosis not present

## 2022-09-08 DIAGNOSIS — D638 Anemia in other chronic diseases classified elsewhere: Secondary | ICD-10-CM | POA: Diagnosis not present

## 2022-09-08 DIAGNOSIS — E1122 Type 2 diabetes mellitus with diabetic chronic kidney disease: Secondary | ICD-10-CM | POA: Diagnosis not present

## 2022-09-08 DIAGNOSIS — Z6838 Body mass index (BMI) 38.0-38.9, adult: Secondary | ICD-10-CM | POA: Diagnosis not present

## 2022-09-08 DIAGNOSIS — I5032 Chronic diastolic (congestive) heart failure: Secondary | ICD-10-CM | POA: Diagnosis not present

## 2022-09-08 DIAGNOSIS — N041 Nephrotic syndrome with focal and segmental glomerular lesions: Secondary | ICD-10-CM | POA: Diagnosis not present

## 2022-09-08 NOTE — Patient Instructions (Signed)
Brandon Warren  09/08/2022     '@PREFPERIOPPHARMACY'$ @   Your procedure is scheduled on  09/12/2022.   Report to [facility] at  [place].M.   Call this number if you have problems the morning of surgery:  [phone]  If you experience any cold or flu symptoms such as cough, fever, chills, shortness of breath, etc. between now and your scheduled surgery, please notify us at the above number.   Remember:  Follow the diet and prep instructions given to you by the office.        Your last dose of ozempic should be on 11/10 or before.       DO NOT take any medications for diabetes the morning of your procedure     Take these medicines the morning of surgery with A SIP OF WATER          Xanax or valium (if needed), carvedilol, diltiazem, febuxostat, levothyroxine, flomax.     Do not wear jewelry, make-up or nail polish.  Do not wear lotions, powders, or perfumes, or deodorant.  Do not shave 48 hours prior to surgery.  Men may shave face and neck.  Do not bring valuables to the hospital.  [facility] is not responsible for any belongings or valuables.  Contacts, dentures or bridgework may not be worn into surgery.  Leave your suitcase in the car.  After surgery it may be brought to your room.  For patients admitted to the hospital, discharge time will be determined by your treatment team.  Patients discharged the day of surgery will not be allowed to drive home and must have someone with them for 24 hours.    Special instructions:   DO NOT smoke tobacco or vape for 24 hours before your procedure.   Please read over the following fact sheets that you were given. Anesthesia Post-op Instructions and Care and Recovery After Surgery      Colonoscopy, Adult, Care After The following information offers guidance on how to care for yourself after your procedure. Your health care provider may also give you more specific instructions. If you have problems or questions,  contact your health care provider. What can I expect after the procedure? After the procedure, it is common to have: A small amount of blood in your stool for 24 hours after the procedure. Some gas. Mild cramping or bloating of your abdomen. Follow these instructions at home: Eating and drinking  Drink enough fluid to keep your urine pale yellow. Follow instructions from your health care provider about eating or drinking restrictions. Resume your normal diet as told by your health care provider. Avoid heavy or fried foods that are hard to digest. Activity Rest as told by your health care provider. Avoid sitting for a long time without moving. Get up to take short walks every 1-2 hours. This is important to improve blood flow and breathing. Ask for help if you feel weak or unsteady. Return to your normal activities as told by your health care provider. Ask your health care provider what activities are safe for you. Managing cramping and bloating  Try walking around when you have cramps or feel bloated. If directed, apply heat to your abdomen as told by your health care provider. Use the heat source that your health care provider recommends, such as a moist heat pack or a heating pad. Place a towel between your skin and the heat source. Leave the heat on for  20-30 minutes. Remove the heat if your skin turns bright red. This is especially important if you are unable to feel pain, heat, or cold. You have a greater risk of getting burned. General instructions If you were given a sedative during the procedure, it can affect you for several hours. Do not drive or operate machinery until your health care provider says that it is safe. For the first 24 hours after the procedure: Do not sign important documents. Do not drink alcohol. Do your regular daily activities at a slower pace than normal. Eat soft foods that are easy to digest. Take over-the-counter and prescription medicines only as told  by your health care provider. Keep all follow-up visits. This is important. Contact a health care provider if: You have blood in your stool 2-3 days after the procedure. Get help right away if: You have more than a small spotting of blood in your stool. You have large blood clots in your stool. You have swelling of your abdomen. You have nausea or vomiting. You have a fever. You have increasing pain in your abdomen that is not relieved with medicine. These symptoms may be an emergency. Get help right away. Call 911. Do not wait to see if the symptoms will go away. Do not drive yourself to the hospital. Summary After the procedure, it is common to have a small amount of blood in your stool. You may also have mild cramping and bloating of your abdomen. If you were given a sedative during the procedure, it can affect you for several hours. Do not drive or operate machinery until your health care provider says that it is safe. Get help right away if you have a lot of blood in your stool, nausea or vomiting, a fever, or increased pain in your abdomen. This information is not intended to replace advice given to you by your health care provider. Make sure you discuss any questions you have with your health care provider. Document Revised: 06/05/2021 Document Reviewed: 06/05/2021 Elsevier Patient Education  [place] After The following information offers guidance on how to care for yourself after your procedure. Your health care provider may also give you more specific instructions. If you have problems or questions, contact your health care provider. What can I expect after the procedure? After the procedure, it is common to have: Tiredness. Little or no memory about what happened during or after the procedure. Impaired judgment when it comes to making decisions. Nausea or vomiting. Some trouble with balance. Follow these instructions at home: For  the time period you were told by your health care provider:  Rest. Do not participate in activities where you could fall or become injured. Do not drive or use machinery. Do not drink alcohol. Do not take sleeping pills or medicines that cause drowsiness. Do not make important decisions or sign legal documents. Do not take care of children on your own. Medicines Take over-the-counter and prescription medicines only as told by your health care provider. If you were prescribed antibiotics, take them as told by your health care provider. Do not stop using the antibiotic even if you start to feel better. Eating and drinking Follow instructions from your health care provider about what you may eat and drink. Drink enough fluid to keep your urine pale yellow. If you vomit: Drink clear fluids slowly and in small amounts as you are able. Clear fluids include water, ice chips, low-calorie sports drinks, and fruit juice that  has water added to it (diluted fruit juice). Eat light and bland foods in small amounts as you are able. These foods include bananas, applesauce, rice, lean meats, toast, and crackers. General instructions  Have a responsible adult stay with you for the time you are told. It is important to have someone help care for you until you are awake and alert. If you have sleep apnea, surgery and some medicines can increase your risk for breathing problems. Follow instructions from your health care provider about wearing your sleep device: When you are sleeping. This includes during daytime naps. While taking prescription pain medicines, sleeping medicines, or medicines that make you drowsy. Do not use any products that contain nicotine or tobacco. These products include cigarettes, chewing tobacco, and vaping devices, such as e-cigarettes. If you need help quitting, ask your health care provider. Contact a health care provider if: You feel nauseous or vomit every time you eat or  drink. You feel light-headed. You are still sleepy or having trouble with balance after 24 hours. You get a rash. You have a fever. You have redness or swelling around the IV site. Get help right away if: You have trouble breathing. You have new confusion after you get home. These symptoms may be an emergency. Get help right away. Call 911. Do not wait to see if the symptoms will go away. Do not drive yourself to the hospital. This information is not intended to replace advice given to you by your health care provider. Make sure you discuss any questions you have with your health care provider. Document Revised: 03/10/2022 Document Reviewed: 03/10/2022 Elsevier Patient Education  [place].

## 2022-09-09 ENCOUNTER — Encounter (HOSPITAL_COMMUNITY)
Admission: RE | Admit: 2022-09-09 | Discharge: 2022-09-09 | Disposition: A | Discharge status: Home / Self Care | Payer: Medicare HMO | Source: Ambulatory Visit | Attending: Internal Medicine | Admitting: Internal Medicine

## 2022-09-09 ENCOUNTER — Encounter (HOSPITAL_COMMUNITY): Payer: Self-pay

## 2022-09-09 ENCOUNTER — Telehealth: Payer: Self-pay | Admitting: *Deleted

## 2022-09-09 DIAGNOSIS — E1169 Type 2 diabetes mellitus with other specified complication: Secondary | ICD-10-CM

## 2022-09-09 DIAGNOSIS — D649 Anemia, unspecified: Secondary | ICD-10-CM

## 2022-09-09 DIAGNOSIS — I129 Hypertensive chronic kidney disease with stage 1 through stage 4 chronic kidney disease, or unspecified chronic kidney disease: Secondary | ICD-10-CM

## 2022-09-09 NOTE — Telephone Encounter (Signed)
Pt LM stating he wanted to cancel procedure for now. He wants done sometime next year and will call when ready. Message sent to endo to cancel.

## 2022-09-10 ENCOUNTER — Institutional Professional Consult (permissible substitution): Payer: Medicare HMO | Admitting: Pulmonary Disease

## 2022-09-12 ENCOUNTER — Encounter (HOSPITAL_COMMUNITY): Admission: RE | Payer: Self-pay | Source: Home / Self Care

## 2022-09-12 ENCOUNTER — Ambulatory Visit (HOSPITAL_COMMUNITY): Admission: RE | Admit: 2022-09-12 | Payer: Medicare HMO | Source: Home / Self Care | Admitting: Internal Medicine

## 2022-09-12 SURGERY — COLONOSCOPY WITH PROPOFOL
Anesthesia: Monitor Anesthesia Care

## 2022-10-07 ENCOUNTER — Encounter: Payer: Self-pay | Admitting: Cardiology

## 2022-10-07 ENCOUNTER — Ambulatory Visit: Payer: Medicare HMO | Attending: Cardiology | Admitting: Cardiology

## 2022-10-07 VITALS — BP 130/64 | HR 92 | Ht 69.0 in | Wt 268.2 lb

## 2022-10-07 DIAGNOSIS — I2510 Atherosclerotic heart disease of native coronary artery without angina pectoris: Secondary | ICD-10-CM

## 2022-10-07 DIAGNOSIS — E782 Mixed hyperlipidemia: Secondary | ICD-10-CM | POA: Diagnosis not present

## 2022-10-07 DIAGNOSIS — R002 Palpitations: Secondary | ICD-10-CM | POA: Diagnosis not present

## 2022-10-07 DIAGNOSIS — I10 Essential (primary) hypertension: Secondary | ICD-10-CM | POA: Diagnosis not present

## 2022-10-07 NOTE — Patient Instructions (Signed)
Medication Instructions:  Your physician recommends that you continue on your current medications as directed. Please refer to the Current Medication list given to you today.   Labwork: None  Testing/Procedures: None  Follow-Up: Follow up with Dr. Branch in 6 months.   Any Other Special Instructions Will Be Listed Below (If Applicable).     If you need a refill on your cardiac medications before your next appointment, please call your pharmacy.  

## 2022-10-07 NOTE — Progress Notes (Signed)
Clinical Summary [name] is a 71 y.o.male seen today for follow up of the following medical problems.    1. Palpitations - feeling of heart racing at times, associated with pressure upper midchest.  - HR up to 140 with pulse ox. Can last up to 10 min. Some lightheadedness. Sudden onset and termination - few cups of coffee a daily, rare EtOH.    - 06/2020 event monitor benign, just rare PVCs       More recent monitor with episodes of SVT, no significant brady episodes  -no recent palpitations - compliant with meds     2. HTN/episode of hypotension - recent episode syncope, dizziness in setting of dehydration 02/2022 - evaluted by EMS, hypotensive.Had been sick few weeks with URI, cough. Was also on aggressive diet, had lost 20-30 lbs.       3. OSA - has cpap, does not have a doctor following - got cpap 4 years ago.  - needs to establish with pulmonary for f/u. Was not able to last referal due to sudden health issues with his wife.    - reports he is not using cpap after weight loss     4. . CAD - 2005 PCI to OM with DES - cath 02/2018 with 20% LM, 20% prox LCX with a widely patent stent - no recent chest pains.  - resports some low endurance, fatigue. Reports very sedentary during panedmic  - no recent chest pains.  - some SOB/DOE with activities, that is stable.      5. Hyperlipidemia - muscle cramps on lipitor, zocor previously - has been on zetia, 03/2020 LDL 95 - labs followe dby pcp   11/2021 TC 206 HDL 36 TG 451 - endo started crestor, has been able to tolerate '5mg'$  daily.  - upcoming labs with nephrology   6. CKD  - followed by Dr Brandon Warren: wife with recent complex hernia surgery, complicated by sepsis and needed repeat surgery. She is doing well now.  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  Past Medical History:  Diagnosis Date   Anemia    Arthritis    Back pain    BPH (benign prostatic hyperplasia)    CAD S/P percutaneous  coronary angioplasty    a. 2005: PCI OM - Zomax study stent DES  2.5 x16 mm. b. cath in 02/2018 showing 20% ISR of LCx stent and 20% stenosis along mid-distal LM.    CKD (chronic kidney disease)    Controlled type 2 diabetes with neuropathy (HCC)    Diastolic heart failure (HCC)    Grade 1   Edema of both lower extremities    Food allergy    Gout    Hypercholesterolemia    Hypertension    Hypothyroidism    Kidney disease    MI (myocardial infarction) ([place])    in his 79s   Sleep apnea    SOB (shortness of breath)      Allergies  Allergen Reactions   Meat Extract Diarrhea, Nausea Only and Other (See Comments)    alphagal reaction   Lipitor [Atorvastatin] Other (See Comments)    Muscle cramps   Simvastatin Other (See Comments)    Muscle cramps    Allopurinol Rash     Current Outpatient Medications  Medication Sig Dispense Refill   acetaminophen (TYLENOL) 500 MG tablet Take 500 mg by mouth 2 (two) times daily as needed (for pain.).  ALPRAZolam (XANAX) 0.5 MG tablet Take 0.5 mg by mouth daily as needed for anxiety.     aspirin EC 81 MG tablet Take 81 mg by mouth every evening.     bromocriptine (PARLODEL) 2.5 MG tablet Take 0.5 tablets (1.25 mg total) by mouth at bedtime. 45 tablet 3   carvedilol (COREG) 25 MG tablet TAKE ONE AND ONE-HALF TABLET (37.'5MG'$  TOTAL) BY MOUTH TWO TIMES A DAY 270 tablet 3   Cholecalciferol (VITAMIN D3) 5000 units CAPS Take 1 capsule (5,000 Units total) by mouth daily. 90 capsule 0   diazepam (VALIUM) 10 MG tablet Take 10 mg by mouth daily as needed (muscle spasms).     diltiazem (CARDIZEM) 30 MG tablet Take 1 tablet (30 mg total) by mouth 2 (two) times daily. 180 tablet 3   EPINEPHrine 0.3 mg/0.3 mL IJ SOAJ injection Inject 0.3 mg into the muscle as needed for anaphylaxis.     ezetimibe (ZETIA) 10 MG tablet Take 10 mg by mouth at bedtime.     Febuxostat 80 MG TABS TAKE ONE TABLET (80 MG TOTAL) BY MOUTH DAILY. 30 tablet 3   ferrous sulfate 325 (65  FE) MG tablet Take 325 mg by mouth 3 (three) times daily.     finasteride (PROSCAR) 5 MG tablet Take 5 mg by mouth daily.      hydrALAZINE (APRESOLINE) 100 MG tablet TAKE ONE TABLET ('100MG'$  TOTAL) BY MOUTH THREE TIMES DAILY 270 tablet 3   levothyroxine (SYNTHROID) 25 MCG tablet TAKE ONE TABLET (25MCG TOTAL) BY MOUTH DAILY BEFORE BREAKFAST. TAKE ALONG WITH 200MCG FOR TOTAL DOSE OF 225MCG. (Patient taking differently: Take 25 mcg by mouth daily before breakfast. Take with 200 mcg to equal 225 mcg daily) 90 tablet 0   levothyroxine (SYNTHROID, LEVOTHROID) 200 MCG tablet TAKE ONE TABLET BY MOUTH EVERY DAY BEFORE BREAKFAST (Patient taking differently: Take 200 mcg by mouth daily before breakfast. Take with 25 mcg to equal 225 mcg daily) 90 tablet 0   losartan (COZAAR) 100 MG tablet TAKE ONE TABLET ('100MG'$  TOTAL) BY MOUTH DAILY 7 tablet 0   MITIGARE 0.6 MG CAPS Take 0.6 mg by mouth 2 (two) times daily as needed (gout flare).     omega-3 acid ethyl esters (LOVAZA) 1 g capsule TAKE ONE CAPSULE BY MOUTH EVERY 12 HOURS 7 capsule 0   OVER THE COUNTER MEDICATION Apply 1 application  topically daily as needed (rash). Terrasil cream     OVER THE COUNTER MEDICATION Apply 1 application  topically daily as needed (itching). E45 itch relief cream     rosuvastatin (CRESTOR) 5 MG tablet Take 5 mg by mouth daily.     Semaglutide, 2 MG/DOSE, (OZEMPIC, 2 MG/DOSE,) 8 MG/3ML SOPN Inject into the skin.     sodium bicarbonate 650 MG tablet Take 650 mg by mouth 3 (three) times daily.     tamsulosin (FLOMAX) 0.4 MG CAPS capsule TAKE ONE (1) CAPSULE BY MOUTH TWICE A DAY. (EVERY 12 HOURS.) 180 capsule 11   TRADJENTA 5 MG TABS tablet Take 5 mg by mouth daily.     triamcinolone ointment (KENALOG) 0.5 % Apply 1 application topically 2 (two) times daily as needed. 30 g 0   No current facility-administered medications for this visit.     Past Surgical History:  Procedure Laterality Date   APPENDECTOMY     CATARACT EXTRACTION Left     COLONOSCOPY N/A 02/01/2014   Procedure: COLONOSCOPY;  Surgeon: Daneil Dolin, MD;  Location: [facility];  Service:  Endoscopy;  Laterality: N/A;  8:30   CORONARY ANGIOPLASTY WITH STENT PLACEMENT  2005   PCI- OM1 Zomax study stent 2.5 x16 mm.   LEFT HEART CATH AND CORONARY ANGIOGRAPHY N/A 03/15/2018   Procedure: LEFT HEART CATH AND CORONARY ANGIOGRAPHY;  Surgeon: Leonie Man, MD;  Location: [facility];  Service: Cardiovascular;  Laterality: N/A;     Allergies  Allergen Reactions   Meat Extract Diarrhea, Nausea Only and Other (See Comments)    alphagal reaction   Lipitor [Atorvastatin] Other (See Comments)    Muscle cramps   Simvastatin Other (See Comments)    Muscle cramps    Allopurinol Rash      Family History  Problem Relation Age of Onset   Colon polyps Mother    Hypertension Mother    High Cholesterol Father    Heart disease Father    COPD Father    Thyroid disease Brandon Warren    Healthy Brandon Warren    Healthy Other    Colon cancer Neg Hx      Social History Mr. Dicioccio reports that he quit smoking about 18 years ago. His smoking use included cigarettes. He has a 60.00 pack-year smoking history. He has never used smokeless tobacco. Mr. Otero reports current alcohol use.   Review of Systems CONSTITUTIONAL: No weight loss, fever, chills, weakness or fatigue.  HEENT: Eyes: No visual loss, blurred vision, double vision or yellow sclerae.No hearing loss, sneezing, congestion, runny nose or sore throat.  SKIN: No rash or itching.  CARDIOVASCULAR: per hpi RESPIRATORY: per hpi GASTROINTESTINAL: No anorexia, nausea, vomiting or diarrhea. No abdominal pain or blood.  GENITOURINARY: No burning on urination, no polyuria NEUROLOGICAL: No headache, dizziness, syncope, paralysis, ataxia, numbness or tingling in the extremities. No change in bowel or bladder control.  MUSCULOSKELETAL: No muscle, back pain, joint pain or stiffness.  LYMPHATICS: No enlarged nodes. No  history of splenectomy.  PSYCHIATRIC: No history of depression or anxiety.  ENDOCRINOLOGIC: No reports of sweating, cold or heat intolerance. No polyuria or polydipsia.  Marland Kitchen   Physical Examination Today's Vitals   10/07/22 1042  BP: 130/64  Pulse: 92  SpO2: 96%  Weight: 268 lb 3.2 oz (121.7 kg)  Height: '5\' 9"'$  (1.753 m)   Body mass index is 39.61 kg/m.  Gen: resting comfortably, no acute distress HEENT: no scleral icterus, pupils equal round and reactive, no palptable cervical adenopathy,  CV: RRR, no mr/g no jvd Resp: Clear to auscultation bilaterally GI: abdomen is soft, non-tender, non-distended, normal bowel sounds, no hepatosplenomegaly MSK: extremities are warm, no edema.  Skin: warm, no rash Neuro:  no focal deficits Psych: appropriate affect   Diagnostic Studies     Assessment and Plan   1. Palpitations -  recent monitor with episodes of SVT, no significant brady episodes - doing well on recent regimen, cnotinue current meds   2. HTN - at goal, continue current meds     3. CAD - no chest pains - recetn DOE unclear if deconditioning or cardiac pathology - disucssed possible 2 day exercise nuclear stress, he will give some thought. Significnat renal dysfunction in general would affect cath candidacy.    4. Hyperlipidemia -  tolerating crestor,upcoming labs with endo - continue currne tmeds     Arnoldo Lenis, M.D.

## 2022-10-11 DIAGNOSIS — R079 Chest pain, unspecified: Secondary | ICD-10-CM | POA: Diagnosis not present

## 2022-10-11 DIAGNOSIS — R002 Palpitations: Secondary | ICD-10-CM | POA: Diagnosis not present

## 2022-10-11 DIAGNOSIS — D72829 Elevated white blood cell count, unspecified: Secondary | ICD-10-CM | POA: Diagnosis not present

## 2022-10-11 DIAGNOSIS — N189 Chronic kidney disease, unspecified: Secondary | ICD-10-CM | POA: Diagnosis not present

## 2022-10-11 DIAGNOSIS — R0789 Other chest pain: Secondary | ICD-10-CM | POA: Diagnosis not present

## 2022-10-11 DIAGNOSIS — E785 Hyperlipidemia, unspecified: Secondary | ICD-10-CM | POA: Diagnosis not present

## 2022-10-11 DIAGNOSIS — R110 Nausea: Secondary | ICD-10-CM | POA: Diagnosis not present

## 2022-10-11 DIAGNOSIS — E1122 Type 2 diabetes mellitus with diabetic chronic kidney disease: Secondary | ICD-10-CM | POA: Diagnosis not present

## 2022-10-11 DIAGNOSIS — I129 Hypertensive chronic kidney disease with stage 1 through stage 4 chronic kidney disease, or unspecified chronic kidney disease: Secondary | ICD-10-CM | POA: Diagnosis not present

## 2022-10-11 DIAGNOSIS — J189 Pneumonia, unspecified organism: Secondary | ICD-10-CM | POA: Diagnosis not present

## 2022-10-13 ENCOUNTER — Telehealth: Payer: Self-pay

## 2022-10-13 ENCOUNTER — Ambulatory Visit: Payer: Medicare HMO | Admitting: Nurse Practitioner

## 2022-10-13 ENCOUNTER — Encounter: Payer: Self-pay | Admitting: Cardiology

## 2022-10-13 DIAGNOSIS — R079 Chest pain, unspecified: Secondary | ICD-10-CM

## 2022-10-13 NOTE — Telephone Encounter (Signed)
Patient said at 10/07/22 office visit about scheduling 2 day nuc stress test.He wants to schedule after the new year,please advise.

## 2022-10-13 NOTE — Telephone Encounter (Signed)
Attempt to reach at both numbers listed,went to voicemail. I will message him via MyChart

## 2022-10-13 NOTE — Telephone Encounter (Signed)
Would go ahead and get stress test if he is able  Zandra Abts MD

## 2022-10-13 NOTE — Addendum Note (Signed)
Addended by: Barbarann Ehlers A on: 10/13/2022 01:15 PM   Modules accepted: Orders

## 2022-10-13 NOTE — Telephone Encounter (Signed)
I placed order for 2 day exercise myoview.

## 2022-10-13 NOTE — Telephone Encounter (Addendum)
Brandon Warren"  P [facility] Triage Phone Number: [phone]   Chest and back pain experienced Saturday 16th on way to a vacation.  Diverted to nearest ER.  BP at triage was 196/92, EKG was okay, 2 sets of troponin were negative, chest X-ray showed noting alarming about my heart but a "small shadow" was noticed on left lung, I was told "possible early start of pneumonia". Had a "GI Cocktail" in ER and received script for two antibiotics which I am starting today.  Pain subsided in ER after 2-1/2 hrs and has nor reoccurred.  Abandoned vacation plans and returned home.  Blood test also showed that my kidney function had worsened (GFR 16 down from 24 at last blood draw).  I have also contacted my nephrologist.  Please advise!      I will forward to Dr.Branch for dispo.    Appointment made today at 3:30 pm with E.Peck,NP at the [facility] office

## 2022-10-23 DIAGNOSIS — N184 Chronic kidney disease, stage 4 (severe): Secondary | ICD-10-CM | POA: Diagnosis not present

## 2022-10-23 DIAGNOSIS — E221 Hyperprolactinemia: Secondary | ICD-10-CM | POA: Diagnosis not present

## 2022-10-23 DIAGNOSIS — E039 Hypothyroidism, unspecified: Secondary | ICD-10-CM | POA: Diagnosis not present

## 2022-10-23 DIAGNOSIS — Z7984 Long term (current) use of oral hypoglycemic drugs: Secondary | ICD-10-CM | POA: Diagnosis not present

## 2022-10-23 DIAGNOSIS — E1122 Type 2 diabetes mellitus with diabetic chronic kidney disease: Secondary | ICD-10-CM | POA: Diagnosis not present

## 2022-10-23 DIAGNOSIS — Z7985 Long-term (current) use of injectable non-insulin antidiabetic drugs: Secondary | ICD-10-CM | POA: Diagnosis not present

## 2022-10-30 DIAGNOSIS — N184 Chronic kidney disease, stage 4 (severe): Secondary | ICD-10-CM | POA: Diagnosis not present

## 2022-10-30 DIAGNOSIS — N041 Nephrotic syndrome with focal and segmental glomerular lesions: Secondary | ICD-10-CM | POA: Diagnosis not present

## 2022-10-30 DIAGNOSIS — R808 Other proteinuria: Secondary | ICD-10-CM | POA: Diagnosis not present

## 2022-10-30 DIAGNOSIS — E1142 Type 2 diabetes mellitus with diabetic polyneuropathy: Secondary | ICD-10-CM | POA: Diagnosis not present

## 2022-10-30 DIAGNOSIS — E039 Hypothyroidism, unspecified: Secondary | ICD-10-CM | POA: Diagnosis not present

## 2022-10-30 DIAGNOSIS — D638 Anemia in other chronic diseases classified elsewhere: Secondary | ICD-10-CM | POA: Diagnosis not present

## 2022-10-30 DIAGNOSIS — N189 Chronic kidney disease, unspecified: Secondary | ICD-10-CM | POA: Diagnosis not present

## 2022-10-30 DIAGNOSIS — E211 Secondary hyperparathyroidism, not elsewhere classified: Secondary | ICD-10-CM | POA: Diagnosis not present

## 2022-10-30 DIAGNOSIS — E1122 Type 2 diabetes mellitus with diabetic chronic kidney disease: Secondary | ICD-10-CM | POA: Diagnosis not present

## 2022-10-30 DIAGNOSIS — E221 Hyperprolactinemia: Secondary | ICD-10-CM | POA: Diagnosis not present

## 2022-10-30 DIAGNOSIS — I129 Hypertensive chronic kidney disease with stage 1 through stage 4 chronic kidney disease, or unspecified chronic kidney disease: Secondary | ICD-10-CM | POA: Diagnosis not present

## 2022-11-04 ENCOUNTER — Ambulatory Visit (HOSPITAL_COMMUNITY): Admission: RE | Admit: 2022-11-04 | Payer: Medicare HMO | Source: Ambulatory Visit

## 2022-11-04 ENCOUNTER — Ambulatory Visit (HOSPITAL_COMMUNITY): Payer: Medicare HMO

## 2022-11-04 ENCOUNTER — Inpatient Hospital Stay (HOSPITAL_COMMUNITY): Admission: RE | Admit: 2022-11-04 | Payer: Medicare HMO | Source: Ambulatory Visit

## 2022-11-05 ENCOUNTER — Institutional Professional Consult (permissible substitution): Payer: Medicare HMO | Admitting: Pulmonary Disease

## 2022-11-05 IMAGING — US US RENAL
1 series · 14 of 25 positions shown · non-contrast
Comparison: Ultrasound 11/05/2017

CLINICAL DATA: Acute renal failure

EXAM:
RENAL / URINARY TRACT ULTRASOUND COMPLETE

[Series 1: us renal · 14 of 54 slices shown]
[im 1/54]
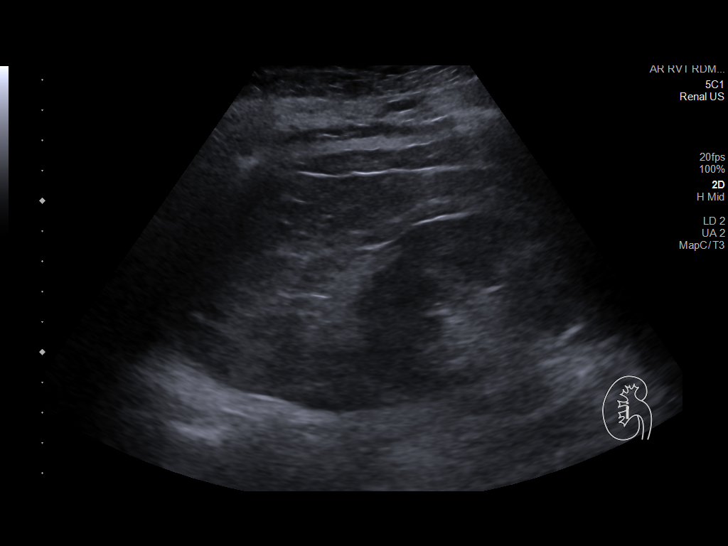
[im 5/54]
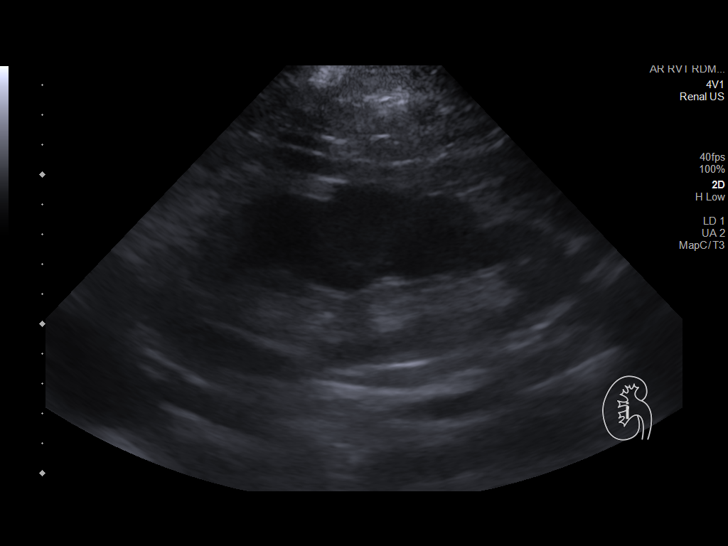
[im 9/54]
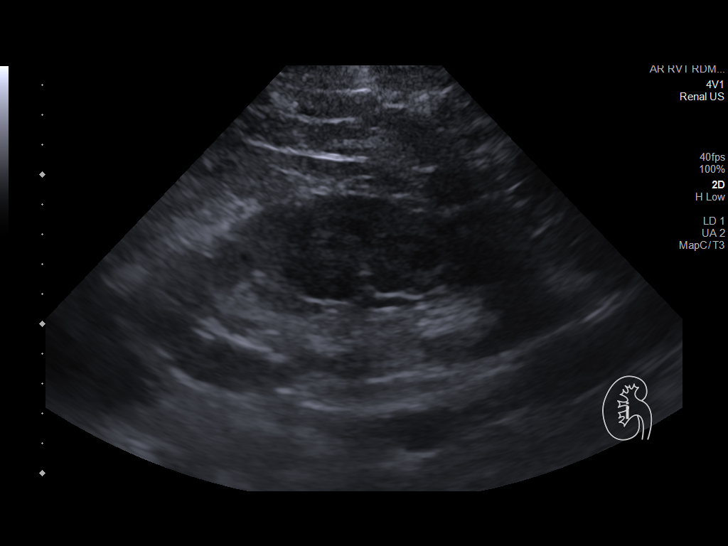
[im 14/54]
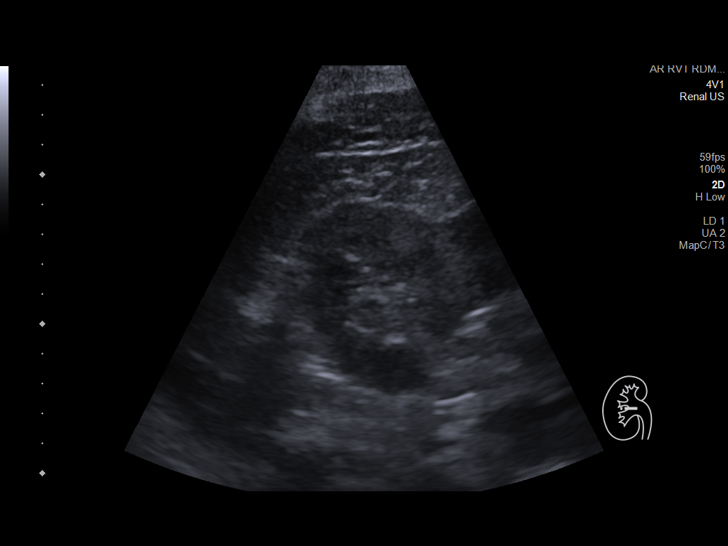
[im 18/54]
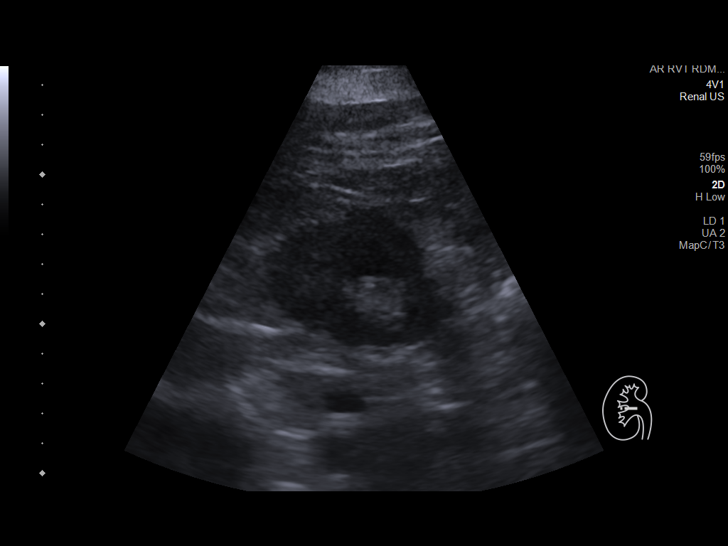
[im 20/54]
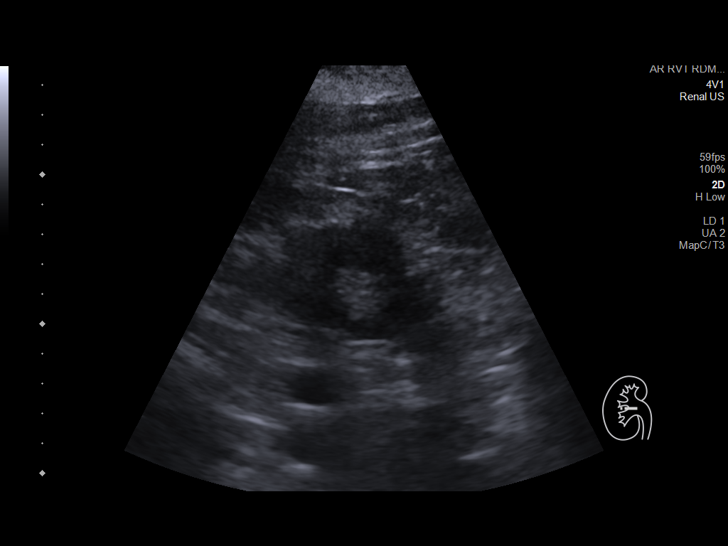
[im 25/54]
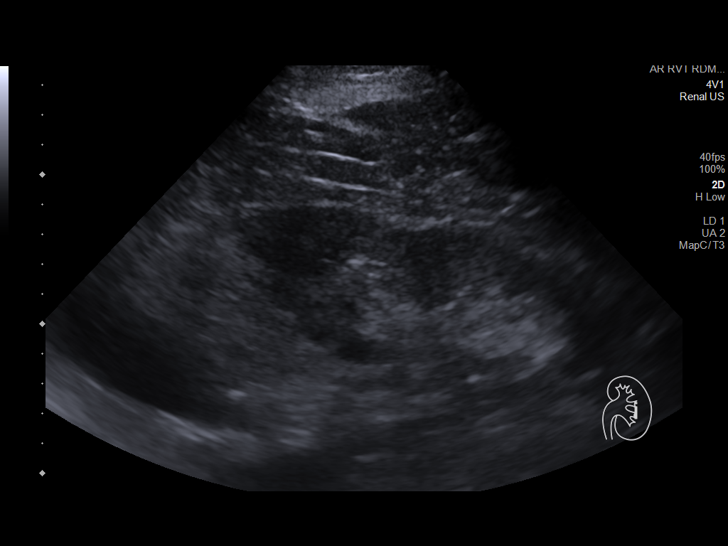
[im 29/54]
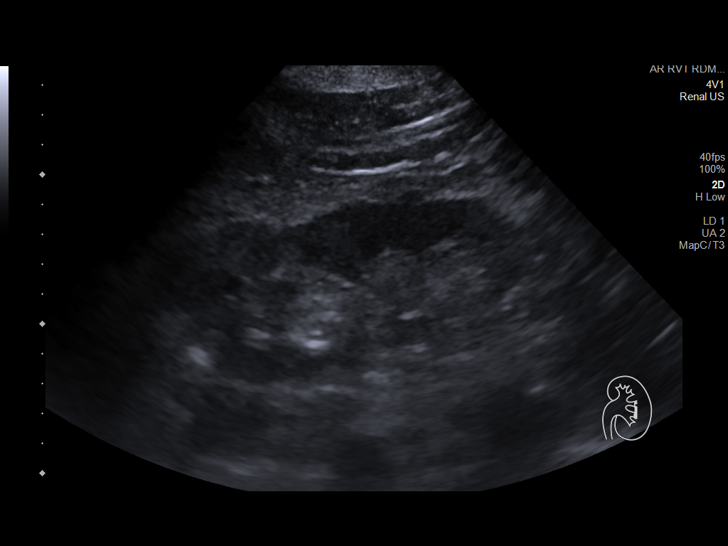
[im 34/54]
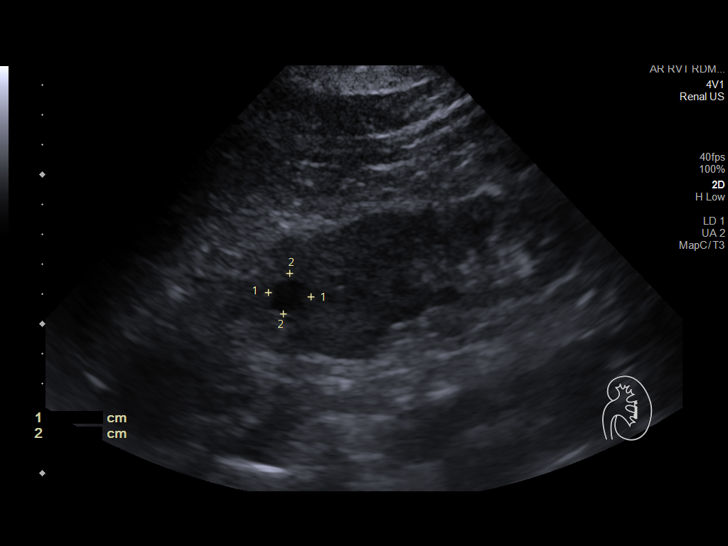
[im 36/54]
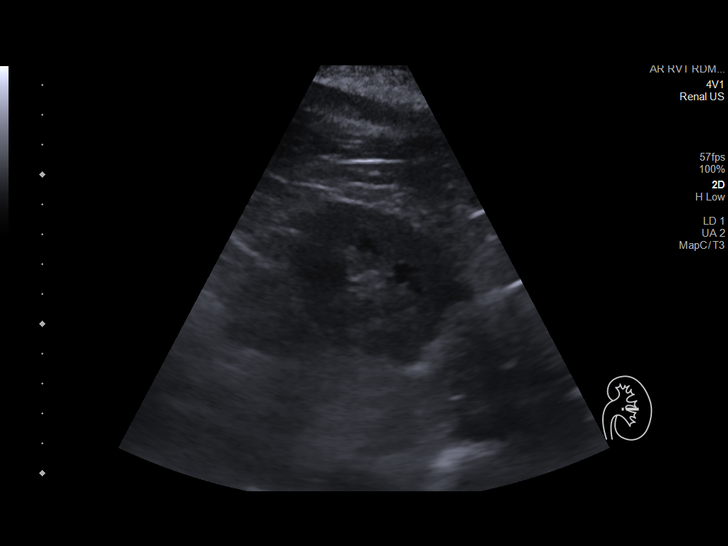
[im 40/54]
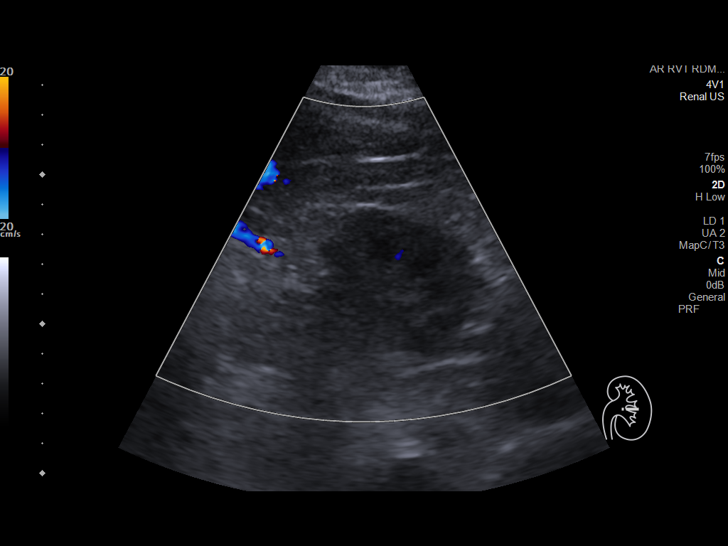
[im 45/54]
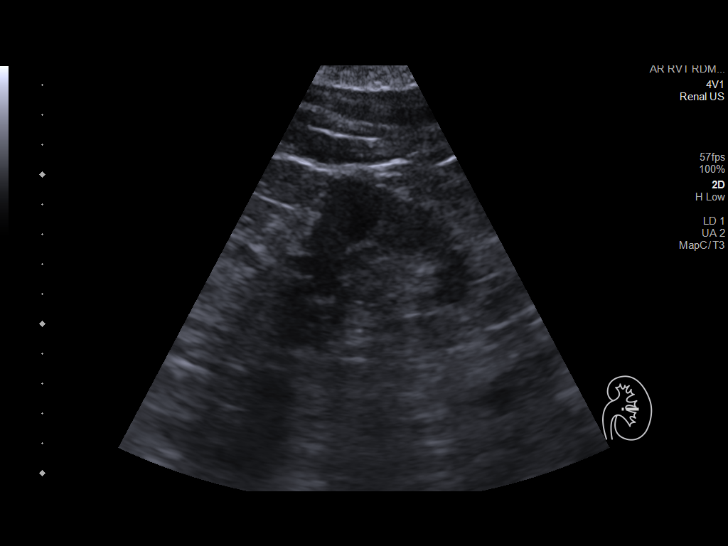
[im 49/54]
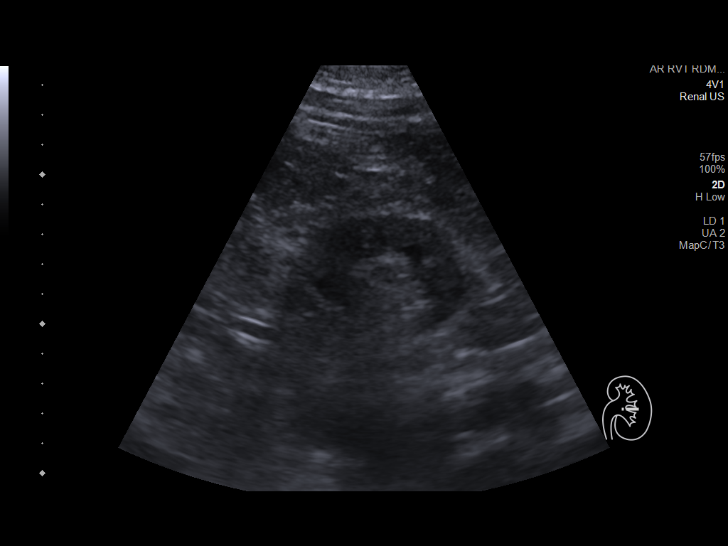
[im 54/54]
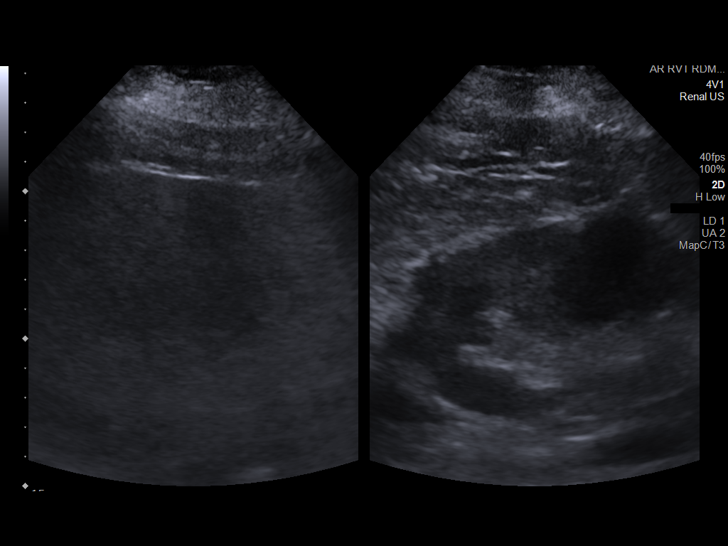

[14 of 25 positions shown; findings below may reference images not displayed]

FINDINGS: Right Kidney:

Renal measurements: 15.2 x 6.8 x 7.7 cm = volume: 416.7 mL.
Echogenicity within normal limits. No mass or hydronephrosis
visualized.

Left Kidney:

Renal measurements: 13 x 6.8 x 5.7 cm = volume: 263.4 mL.
Echogenicity within normal limits. No hydronephrosis. Small cyst in
the upper pole measuring up to 15 mm.

Bladder:

Appears normal for degree of bladder distention.

Other:

None.
IMPRESSION: 1. Kidneys appear large in size bilaterally but without
hydronephrosis nor significant change compared to prior ultrasound.
2. Small cysts in the left kidney

## 2022-11-11 ENCOUNTER — Other Ambulatory Visit: Payer: Self-pay | Admitting: Internal Medicine

## 2022-11-11 DIAGNOSIS — M1040 Other secondary gout, unspecified site: Secondary | ICD-10-CM

## 2022-11-12 DIAGNOSIS — I10 Essential (primary) hypertension: Secondary | ICD-10-CM | POA: Diagnosis not present

## 2022-11-12 DIAGNOSIS — Z6833 Body mass index (BMI) 33.0-33.9, adult: Secondary | ICD-10-CM | POA: Diagnosis not present

## 2022-11-12 DIAGNOSIS — E1129 Type 2 diabetes mellitus with other diabetic kidney complication: Secondary | ICD-10-CM | POA: Diagnosis not present

## 2022-11-12 DIAGNOSIS — Z0001 Encounter for general adult medical examination with abnormal findings: Secondary | ICD-10-CM | POA: Diagnosis not present

## 2022-11-12 DIAGNOSIS — N184 Chronic kidney disease, stage 4 (severe): Secondary | ICD-10-CM | POA: Diagnosis not present

## 2022-11-12 DIAGNOSIS — Z1331 Encounter for screening for depression: Secondary | ICD-10-CM | POA: Diagnosis not present

## 2022-11-12 DIAGNOSIS — Z125 Encounter for screening for malignant neoplasm of prostate: Secondary | ICD-10-CM | POA: Diagnosis not present

## 2022-11-19 ENCOUNTER — Emergency Department (HOSPITAL_COMMUNITY): Payer: Medicare HMO

## 2022-11-19 ENCOUNTER — Other Ambulatory Visit: Payer: Self-pay

## 2022-11-19 ENCOUNTER — Encounter (HOSPITAL_COMMUNITY): Payer: Self-pay

## 2022-11-19 ENCOUNTER — Observation Stay (HOSPITAL_COMMUNITY)
Admission: EM | Admit: 2022-11-19 | Discharge: 2022-11-21 | Disposition: A | Discharge status: Home / Self Care | Payer: Medicare HMO | Attending: Internal Medicine | Admitting: Internal Medicine

## 2022-11-19 DIAGNOSIS — Z79899 Other long term (current) drug therapy: Secondary | ICD-10-CM | POA: Diagnosis not present

## 2022-11-19 DIAGNOSIS — N189 Chronic kidney disease, unspecified: Secondary | ICD-10-CM | POA: Insufficient documentation

## 2022-11-19 DIAGNOSIS — E669 Obesity, unspecified: Secondary | ICD-10-CM | POA: Insufficient documentation

## 2022-11-19 DIAGNOSIS — N401 Enlarged prostate with lower urinary tract symptoms: Secondary | ICD-10-CM

## 2022-11-19 DIAGNOSIS — I130 Hypertensive heart and chronic kidney disease with heart failure and stage 1 through stage 4 chronic kidney disease, or unspecified chronic kidney disease: Secondary | ICD-10-CM | POA: Insufficient documentation

## 2022-11-19 DIAGNOSIS — Z955 Presence of coronary angioplasty implant and graft: Secondary | ICD-10-CM | POA: Diagnosis not present

## 2022-11-19 DIAGNOSIS — R319 Hematuria, unspecified: Secondary | ICD-10-CM | POA: Diagnosis not present

## 2022-11-19 DIAGNOSIS — Z7982 Long term (current) use of aspirin: Secondary | ICD-10-CM | POA: Insufficient documentation

## 2022-11-19 DIAGNOSIS — I2510 Atherosclerotic heart disease of native coronary artery without angina pectoris: Secondary | ICD-10-CM | POA: Insufficient documentation

## 2022-11-19 DIAGNOSIS — E039 Hypothyroidism, unspecified: Secondary | ICD-10-CM | POA: Diagnosis present

## 2022-11-19 DIAGNOSIS — N3000 Acute cystitis without hematuria: Secondary | ICD-10-CM | POA: Diagnosis present

## 2022-11-19 DIAGNOSIS — Z87891 Personal history of nicotine dependence: Secondary | ICD-10-CM | POA: Diagnosis not present

## 2022-11-19 DIAGNOSIS — N3001 Acute cystitis with hematuria: Secondary | ICD-10-CM | POA: Diagnosis not present

## 2022-11-19 DIAGNOSIS — E1110 Type 2 diabetes mellitus with ketoacidosis without coma: Secondary | ICD-10-CM | POA: Diagnosis not present

## 2022-11-19 DIAGNOSIS — N400 Enlarged prostate without lower urinary tract symptoms: Secondary | ICD-10-CM | POA: Insufficient documentation

## 2022-11-19 DIAGNOSIS — N179 Acute kidney failure, unspecified: Secondary | ICD-10-CM | POA: Diagnosis not present

## 2022-11-19 DIAGNOSIS — E1165 Type 2 diabetes mellitus with hyperglycemia: Secondary | ICD-10-CM | POA: Insufficient documentation

## 2022-11-19 DIAGNOSIS — I10 Essential (primary) hypertension: Secondary | ICD-10-CM | POA: Diagnosis present

## 2022-11-19 DIAGNOSIS — E1169 Type 2 diabetes mellitus with other specified complication: Secondary | ICD-10-CM

## 2022-11-19 DIAGNOSIS — I5030 Unspecified diastolic (congestive) heart failure: Secondary | ICD-10-CM | POA: Diagnosis not present

## 2022-11-19 DIAGNOSIS — N184 Chronic kidney disease, stage 4 (severe): Secondary | ICD-10-CM | POA: Insufficient documentation

## 2022-11-19 DIAGNOSIS — K5730 Diverticulosis of large intestine without perforation or abscess without bleeding: Secondary | ICD-10-CM | POA: Diagnosis not present

## 2022-11-19 DIAGNOSIS — N3091 Cystitis, unspecified with hematuria: Principal | ICD-10-CM

## 2022-11-19 DIAGNOSIS — E1122 Type 2 diabetes mellitus with diabetic chronic kidney disease: Secondary | ICD-10-CM | POA: Insufficient documentation

## 2022-11-19 DIAGNOSIS — E782 Mixed hyperlipidemia: Secondary | ICD-10-CM | POA: Insufficient documentation

## 2022-11-19 DIAGNOSIS — E872 Acidosis, unspecified: Secondary | ICD-10-CM | POA: Insufficient documentation

## 2022-11-19 DIAGNOSIS — N3289 Other specified disorders of bladder: Secondary | ICD-10-CM | POA: Diagnosis not present

## 2022-11-19 LAB — BASIC METABOLIC PANEL
Anion gap: 15 (ref 5–15)
BUN: 37 mg/dL — ABNORMAL HIGH (ref 8–23)
CO2: 22 mmol/L (ref 22–32)
Calcium: 9.2 mg/dL (ref 8.9–10.3)
Chloride: 98 mmol/L (ref 98–111)
Creatinine, Ser: 2.76 mg/dL — ABNORMAL HIGH (ref 0.61–1.24)
GFR, Estimated: 24 mL/min — ABNORMAL LOW (ref >60)
Glucose, Bld: 185 mg/dL — ABNORMAL HIGH (ref 70–99)
Potassium: 4.5 mmol/L (ref 3.5–5.1)
Sodium: 135 mmol/L (ref 135–145)

## 2022-11-19 LAB — URINALYSIS, ROUTINE W REFLEX MICROSCOPIC
Bacteria, UA: NONE SEEN
Bilirubin Urine: NEGATIVE
Glucose, UA: 50 mg/dL — AB
Ketones, ur: NEGATIVE mg/dL
Nitrite: NEGATIVE
Protein, ur: >=300 mg/dL — AB
RBC / HPF: >50 RBC/hpf — ABNORMAL HIGH (ref 0–5)
Specific Gravity, Urine: 1.02 (ref 1.005–1.030)
WBC, UA: >50 WBC/hpf — ABNORMAL HIGH (ref 0–5)
pH: 6 (ref 5.0–8.0)

## 2022-11-19 LAB — LACTIC ACID, PLASMA: Lactic Acid, Venous: 2.2 mmol/L (ref 0.5–1.9)

## 2022-11-19 LAB — CBC WITH DIFFERENTIAL/PLATELET
Abs Immature Granulocytes: 0.09 10*3/uL — ABNORMAL HIGH (ref 0.00–0.07)
Basophils Absolute: 0.1 10*3/uL (ref 0.0–0.1)
Basophils Relative: 0 %
Eosinophils Absolute: 0 10*3/uL (ref 0.0–0.5)
Eosinophils Relative: 0 %
HCT: 41.2 % (ref 39.0–52.0)
Hemoglobin: 13.6 g/dL (ref 13.0–17.0)
Immature Granulocytes: 0 %
Lymphocytes Relative: 5 %
Lymphs Abs: 1 10*3/uL (ref 0.7–4.0)
MCH: 28.7 pg (ref 26.0–34.0)
MCHC: 33 g/dL (ref 30.0–36.0)
MCV: 86.9 fL (ref 80.0–100.0)
Monocytes Absolute: 1.5 10*3/uL — ABNORMAL HIGH (ref 0.1–1.0)
Monocytes Relative: 7 %
Neutro Abs: 19.2 10*3/uL — ABNORMAL HIGH (ref 1.7–7.7)
Neutrophils Relative %: 88 %
Platelets: 261 10*3/uL (ref 150–400)
RBC: 4.74 MIL/uL (ref 4.22–5.81)
RDW: 14.1 % (ref 11.5–15.5)
WBC: 22 10*3/uL — ABNORMAL HIGH (ref 4.0–10.5)
nRBC: 0 % (ref 0.0–0.2)

## 2022-11-19 LAB — HEPATIC FUNCTION PANEL
ALT: 23 U/L (ref 0–44)
AST: 20 U/L (ref 15–41)
Albumin: 3.7 g/dL (ref 3.5–5.0)
Alkaline Phosphatase: 64 U/L (ref 38–126)
Bilirubin, Direct: 0.1 mg/dL (ref 0.0–0.2)
Indirect Bilirubin: 0.7 mg/dL (ref 0.3–0.9)
Total Bilirubin: 0.8 mg/dL (ref 0.3–1.2)
Total Protein: 7.9 g/dL (ref 6.5–8.1)

## 2022-11-19 LAB — PROTIME-INR
INR: 1.1 (ref 0.8–1.2)
Prothrombin Time: 13.8 seconds (ref 11.4–15.2)

## 2022-11-19 LAB — APTT: aPTT: 38 seconds — ABNORMAL HIGH (ref 24–36)

## 2022-11-19 MED ORDER — OXYCODONE-ACETAMINOPHEN 5-325 MG PO TABS
1.0000 | ORAL_TABLET | Freq: Once | ORAL | Status: AC
  Administered 2022-11-19: 1 via ORAL
  Filled 2022-11-19: qty 1

## 2022-11-19 MED ORDER — SODIUM CHLORIDE 0.9 % IV BOLUS
1000.0000 mL | Freq: Once | INTRAVENOUS | Status: AC
  Administered 2022-11-19: 1000 mL via INTRAVENOUS

## 2022-11-19 NOTE — ED Triage Notes (Signed)
Pt reports blood in urine that started today, frequent urination, urgency, decreased urine output. Started on new "prostate medication" today.

## 2022-11-19 NOTE — ED Notes (Signed)
Patient transported to CT 

## 2022-11-19 NOTE — ED Provider Notes (Signed)
[facility] Provider Note   CSN: [id number] Arrival date & time: 11/19/22  2015     History  Chief Complaint  Patient presents with   Hematuria    Brandon Warren is a 72 y.o. male.   Hematuria Pertinent negatives include no chest pain, no abdominal pain and no shortness of breath.       Brandon Warren is a 72 y.o. male with past medical history of type 2 diabetes, hypertension, coronary artery disease, stage IV CKD who presents to the Emergency Department complaining of bloody urine that started earlier today.  He also endorsed urinary frequency and decreased urine output.  Symptoms began earlier today.  He started taking dutasteride today and is concerned that this may be a side effect of the medication.  He describes having a pressure in his perineum.  Denies any abdominal pain, flank pain, fever, chills, nausea or vomiting. No history of kidney stones.   Home Medications Prior to Admission medications   Medication Sig Start Date End Date Taking? Authorizing Provider  acetaminophen (TYLENOL) 500 MG tablet Take 500 mg by mouth 2 (two) times daily as needed (for pain.). Patient not taking: Reported on 10/07/2022    [provider]  ALPRAZolam Duanne Moron) 0.5 MG tablet Take 0.5 mg by mouth daily as needed for anxiety.    [provider]  aspirin EC 81 MG tablet Take 81 mg by mouth every evening.    [provider]  bromocriptine (PARLODEL) 2.5 MG tablet Take 0.5 tablets (1.25 mg total) by mouth at bedtime. Patient not taking: Reported on 10/07/2022 08/12/21   Renato Shin, MD  carvedilol (COREG) 25 MG tablet TAKE ONE AND ONE-HALF TABLET (37.'5MG'$  TOTAL) BY MOUTH TWO TIMES A DAY 10/25/21   Arnoldo Lenis, MD  Cholecalciferol (VITAMIN D3) 5000 units CAPS Take 1 capsule (5,000 Units total) by mouth daily. 09/30/17   Cassandria Anger, MD  diazepam (VALIUM) 10 MG tablet Take 10 mg by mouth daily as needed  (muscle spasms).    [provider]  diltiazem (CARDIZEM) 30 MG tablet Take 1 tablet (30 mg total) by mouth 2 (two) times daily. 11/15/21   Arnoldo Lenis, MD  EPINEPHrine 0.3 mg/0.3 mL IJ SOAJ injection Inject 0.3 mg into the muscle as needed for anaphylaxis.    [provider]  ezetimibe (ZETIA) 10 MG tablet Take 10 mg by mouth at bedtime. Patient not taking: Reported on 10/07/2022    [provider]  Febuxostat 80 MG TABS TAKE ONE TABLET (80 MG TOTAL) BY MOUTH DAILY. 03/26/22   Rice, Resa Miner, MD  ferrous sulfate 325 (65 FE) MG tablet Take 325 mg by mouth 3 (three) times daily.    [provider]  finasteride (PROSCAR) 5 MG tablet Take 5 mg by mouth daily.     [provider]  furosemide (LASIX) 20 MG tablet Take 20 mg by mouth. 08/28/22   [provider]  hydrALAZINE (APRESOLINE) 100 MG tablet TAKE ONE TABLET ('100MG'$  TOTAL) BY MOUTH THREE TIMES DAILY 02/25/22   Arnoldo Lenis, MD  JARDIANCE 10 MG TABS tablet Take 10 mg by mouth daily.    [provider]  KERENDIA 10 MG TABS Take 1 tablet by mouth daily.    [provider]  levothyroxine (SYNTHROID) 25 MCG tablet Take 25 mcg by mouth daily before breakfast. Take with 200 mg    [provider]  levothyroxine (SYNTHROID, LEVOTHROID) 200 MCG  tablet TAKE ONE TABLET BY MOUTH EVERY DAY BEFORE BREAKFAST Patient taking differently: Take 200 mcg by mouth daily before breakfast. Take with 25 mcg to equal 225 mcg daily 01/19/19   Cassandria Anger, MD  losartan (COZAAR) 100 MG tablet TAKE ONE TABLET ('100MG'$  TOTAL) BY MOUTH DAILY 05/02/19   Nida, Marella Chimes, MD  MITIGARE 0.6 MG CAPS Take 0.6 mg by mouth 2 (two) times daily as needed (gout flare). 11/29/20   [provider]  omega-3 acid ethyl esters (LOVAZA) 1 g capsule TAKE ONE CAPSULE BY MOUTH EVERY 12 HOURS 05/02/19   Nida, Marella Chimes, MD  rosuvastatin (CRESTOR) 5 MG tablet Take 5 mg by mouth daily.  02/25/22   [provider]  Semaglutide, 2 MG/DOSE, (OZEMPIC, 2 MG/DOSE,) 8 MG/3ML SOPN Inject into the skin. 11/28/21   [provider]  sodium bicarbonate 650 MG tablet Take 650 mg by mouth 3 (three) times daily. 09/04/21   [provider]  tamsulosin (FLOMAX) 0.4 MG CAPS capsule TAKE ONE (1) CAPSULE BY MOUTH TWICE A DAY. (EVERY 12 HOURS.) 01/07/22   McKenzie, Candee Furbish, MD  TRADJENTA 5 MG TABS tablet Take 5 mg by mouth daily. Patient not taking: Reported on 10/07/2022 10/15/21   [provider]      Allergies    Meat extract, Lipitor [atorvastatin], Simvastatin, and Allopurinol    Review of Systems   Review of Systems  Constitutional:  Negative for chills and fever.  Respiratory:  Negative for shortness of breath.   Cardiovascular:  Negative for chest pain.  Gastrointestinal:  Negative for abdominal pain, nausea and vomiting.  Genitourinary:  Positive for decreased urine volume, difficulty urinating, dysuria, frequency and hematuria. Negative for flank pain, penile swelling and scrotal swelling.       "Pressure" to his rectum and genital area  Musculoskeletal:  Negative for back pain.  Neurological:  Negative for weakness and numbness.    Physical Exam Updated Vital Signs BP (!) 168/91 (BP Location: Right Arm)   Pulse 90   Temp 99 F (37.2 C) (Oral)   Resp 17   Ht '5\' 9"'$  (1.753 m)   Wt 122.5 kg   SpO2 97%   BMI 39.87 kg/m  Physical Exam Vitals and nursing note reviewed.  Constitutional:      General: He is not in acute distress.    Appearance: Normal appearance. He is not ill-appearing or toxic-appearing.  Cardiovascular:     Rate and Rhythm: Normal rate and regular rhythm.     Pulses: Normal pulses.  Pulmonary:     Effort: Pulmonary effort is normal.  Chest:     Chest wall: No tenderness.  Abdominal:     Palpations: Abdomen is soft.     Tenderness: There is no abdominal tenderness. There is no right CVA tenderness or left CVA  tenderness.  Musculoskeletal:        General: Normal range of motion.  Skin:    General: Skin is warm.     Capillary Refill: Capillary refill takes less than 2 seconds.     Findings: No rash.  Neurological:     General: No focal deficit present.     Mental Status: He is alert.     Sensory: No sensory deficit.     Motor: No weakness.     ED Results / Procedures / Treatments   Labs (all labs ordered are listed, but only abnormal results are displayed) Labs Reviewed  URINALYSIS, ROUTINE W REFLEX MICROSCOPIC - Abnormal; Notable  for the following components:      Result Value   Color, Urine AMBER (*)    APPearance CLOUDY (*)    Glucose, UA 50 (*)    Hgb urine dipstick LARGE (*)    Protein, ur >=300 (*)    Leukocytes,Ua MODERATE (*)    RBC / HPF >50 (*)    WBC, UA >50 (*)    All other components within normal limits  CBC WITH DIFFERENTIAL/PLATELET - Abnormal; Notable for the following components:   WBC 22.0 (*)    Neutro Abs 19.2 (*)    Monocytes Absolute 1.5 (*)    Abs Immature Granulocytes 0.09 (*)    All other components within normal limits  BASIC METABOLIC PANEL - Abnormal; Notable for the following components:   Glucose, Bld 185 (*)    BUN 37 (*)    Creatinine, Ser 2.76 (*)    GFR, Estimated 24 (*)    All other components within normal limits  LACTIC ACID, PLASMA - Abnormal; Notable for the following components:   Lactic Acid, Venous 2.2 (*)    All other components within normal limits  APTT - Abnormal; Notable for the following components:   aPTT 38 (*)    All other components within normal limits  CULTURE, BLOOD (ROUTINE X 2)  CULTURE, BLOOD (ROUTINE X 2)  URINE CULTURE  PROTIME-INR  HEPATIC FUNCTION PANEL  LACTIC ACID, PLASMA    EKG EKG Interpretation  Date/Time:  Wednesday November 19 2022 23:41:24 EST Ventricular Rate:  83 PR Interval:  163 QRS Duration: 92 QT Interval:  368 QTC Calculation: 433 R Axis:   -34 Text Interpretation: Sinus rhythm  Inferior infarct, old Consider anterior infarct Baseline wander in lead(s) V1 V3 Accounting for baseline wander No significant change since last tracing Confirmed by Calvert Cantor [phone]) on 11/20/2022 12:06:44 AM  Radiology CT Renal Stone Study  Result Date: 11/19/2022 CLINICAL DATA:  Hematuria. EXAM: CT ABDOMEN AND PELVIS WITHOUT CONTRAST TECHNIQUE: Multidetector CT imaging of the abdomen and pelvis was performed following the standard protocol without IV contrast. RADIATION DOSE REDUCTION: This exam was performed according to the departmental dose-optimization program which includes automated exposure control, adjustment of the mA and/or kV according to patient size and/or use of iterative reconstruction technique. COMPARISON:  None Available. FINDINGS: Lower chest: No acute abnormality. Hepatobiliary: No focal liver abnormality is seen. The gallbladder is moderately distended. No gallstones, gallbladder wall thickening, or biliary dilatation. Pancreas: Punctate calcifications are seen scattered throughout the pancreatic parenchyma. No pancreatic ductal dilatation or surrounding inflammatory changes. Spleen: A 2.1 cm diameter area of low attenuation is within an otherwise normal-appearing spleen. Adrenals/Urinary Tract: Adrenal glands are unremarkable. Kidneys are normal in size, without renal calculi, focal lesion, or hydronephrosis. The urinary bladder is poorly distended and subsequently limited in evaluation. Moderate severity diffuse urinary bladder wall thickening is noted. Very mild hazy surrounding inflammatory fat stranding is seen. Stomach/Bowel: Stomach is within normal limits. The appendix is not identified. No evidence of bowel wall thickening, distention, or inflammatory changes. Noninflamed diverticula are seen throughout the descending and sigmoid colon. Vascular/Lymphatic: Aortic atherosclerosis. No enlarged abdominal or pelvic lymph nodes. Reproductive: The prostate gland is mildly  enlarged. Mild prostate gland calcification is also noted. Other: No abdominal wall hernia or abnormality. No abdominopelvic ascites. Musculoskeletal: There is grade 2 anterolisthesis of the L5 vertebral body on S1 with marked severity multilevel degenerative changes noted throughout the lumbar spine. IMPRESSION: 1. Findings consistent with acute cystitis. Correlation with urinalysis  is recommended. 2. Colonic diverticulosis. 3. Grade 2 anterolisthesis of the L5 vertebral body on S1 with marked severity multilevel degenerative changes throughout the lumbar spine. 4. 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 Correlation with nonemergent splenic ultrasound is recommended. 5. Aortic atherosclerosis. Aortic Atherosclerosis (ICD10-I70.0). Electronically Signed   By: Virgina Norfolk M.D.   On: 11/19/2022 23:38    Procedures Procedures    Medications Ordered in ED Medications  oxyCODONE-acetaminophen (PERCOCET/ROXICET) 5-325 MG per tablet 1 tablet (has no administration in time range)    ED Course/ Medical Decision Making/ A&P                             Medical Decision Making Patient here for evaluations of symptoms of hematuria, increased urinary frequency and decreased urine output.  No abdominal or flank pain.  Began taking dutasteride earlier today concerned his symptoms are related to medication.  On exam, patient nontoxic-appearing but appears uncomfortable.  No CVA tenderness on exam and abdomen is soft and nontender.  I suspect he has UTI.  May also be symptoms of BPH, epididymitis, kidney stone, pyelonephritis  Amount and/or Complexity of Data Reviewed Labs: ordered.    Details: Urinalysis shows cloudy urine with large blood greater than 300 protein, moderate leukocytes and greater than 50 white cells and red cells.  Urine culture is pending.   Significant leukocytosis with white count of 22,000, chemistries show worsening of his CKD serum creatinine this  evening is 2.76 up from 1 year ago at 1.96, liver functions unremarkable.  Initial lactic acid 2.2 Radiology: ordered.    Details: CT renal stone study shows acute cystitis ECG/medicine tests: ordered.    Details: EKG was sinus rhythm no significant change from last tracing Discussion of management or test interpretation with external provider(s): Patient initially given liter of IV fluids, lactic acid pending likely evolving sepsis.  Not significantly tachycardic but patient does take a beta-blocker.  He has worsening of his chronic kidney disease and source of infection.  Antibiotics ordered urine and blood cultured results pending.  He will need hospital admission.  Discussed with overnight physician, Dr. Karle Starch who agrees to consult with hospitalist for patient admission  Risk Prescription drug management.           Final Clinical Impression(s) / ED Diagnoses Final diagnoses:  Hemorrhagic cystitis  AKI (acute kidney injury) [facility])    Rx / [facility] Orders ED Discharge Orders     None         Kem Parkinson, PA-C 11/20/22 0038    Godfrey Pick, MD 11/24/22 [phone]

## 2022-11-20 ENCOUNTER — Encounter (HOSPITAL_COMMUNITY): Payer: Self-pay | Admitting: Internal Medicine

## 2022-11-20 ENCOUNTER — Other Ambulatory Visit: Payer: Self-pay

## 2022-11-20 DIAGNOSIS — I10 Essential (primary) hypertension: Secondary | ICD-10-CM | POA: Diagnosis not present

## 2022-11-20 DIAGNOSIS — I119 Hypertensive heart disease without heart failure: Secondary | ICD-10-CM

## 2022-11-20 DIAGNOSIS — N3001 Acute cystitis with hematuria: Secondary | ICD-10-CM | POA: Diagnosis not present

## 2022-11-20 DIAGNOSIS — E669 Obesity, unspecified: Secondary | ICD-10-CM | POA: Diagnosis not present

## 2022-11-20 DIAGNOSIS — N401 Enlarged prostate with lower urinary tract symptoms: Secondary | ICD-10-CM

## 2022-11-20 DIAGNOSIS — N184 Chronic kidney disease, stage 4 (severe): Secondary | ICD-10-CM | POA: Diagnosis not present

## 2022-11-20 DIAGNOSIS — E872 Acidosis, unspecified: Secondary | ICD-10-CM | POA: Diagnosis not present

## 2022-11-20 DIAGNOSIS — E1165 Type 2 diabetes mellitus with hyperglycemia: Secondary | ICD-10-CM | POA: Diagnosis not present

## 2022-11-20 DIAGNOSIS — E039 Hypothyroidism, unspecified: Secondary | ICD-10-CM | POA: Diagnosis not present

## 2022-11-20 DIAGNOSIS — E782 Mixed hyperlipidemia: Secondary | ICD-10-CM | POA: Insufficient documentation

## 2022-11-20 DIAGNOSIS — Z9861 Coronary angioplasty status: Secondary | ICD-10-CM

## 2022-11-20 DIAGNOSIS — N179 Acute kidney failure, unspecified: Secondary | ICD-10-CM | POA: Diagnosis not present

## 2022-11-20 DIAGNOSIS — N189 Chronic kidney disease, unspecified: Secondary | ICD-10-CM | POA: Insufficient documentation

## 2022-11-20 DIAGNOSIS — N400 Enlarged prostate without lower urinary tract symptoms: Secondary | ICD-10-CM | POA: Insufficient documentation

## 2022-11-20 DIAGNOSIS — I2510 Atherosclerotic heart disease of native coronary artery without angina pectoris: Secondary | ICD-10-CM

## 2022-11-20 DIAGNOSIS — N3000 Acute cystitis without hematuria: Secondary | ICD-10-CM | POA: Diagnosis present

## 2022-11-20 LAB — URINE CULTURE: Culture: NO GROWTH

## 2022-11-20 LAB — MAGNESIUM: Magnesium: 2 mg/dL (ref 1.7–2.4)

## 2022-11-20 LAB — HEMOGLOBIN A1C
Hgb A1c MFr Bld: 6.5 % — ABNORMAL HIGH (ref 4.8–5.6)
Mean Plasma Glucose: 139.85 mg/dL

## 2022-11-20 LAB — GLUCOSE, CAPILLARY
Glucose-Capillary: 174 mg/dL — ABNORMAL HIGH (ref 70–99)
Glucose-Capillary: 216 mg/dL — ABNORMAL HIGH (ref 70–99)
Glucose-Capillary: 170 mg/dL — ABNORMAL HIGH (ref 70–99)
Glucose-Capillary: 168 mg/dL — ABNORMAL HIGH (ref 70–99)

## 2022-11-20 LAB — PHOSPHORUS: Phosphorus: 3.1 mg/dL (ref 2.5–4.6)

## 2022-11-20 LAB — LACTIC ACID, PLASMA: Lactic Acid, Venous: 1.6 mmol/L (ref 0.5–1.9)

## 2022-11-20 MED ORDER — FINASTERIDE 5 MG PO TABS
5.0000 mg | ORAL_TABLET | Freq: Every day | ORAL | Status: DC
  Administered 2022-11-20 – 2022-11-21 (×2): 5 mg via ORAL
  Filled 2022-11-20 (×2): qty 1

## 2022-11-20 MED ORDER — ACETAMINOPHEN 325 MG PO TABS: 650.0000 mg | ORAL_TABLET | Freq: Four times a day (QID) | ORAL | Status: DC | PRN

## 2022-11-20 MED ORDER — SODIUM CHLORIDE 0.9 % IV SOLN
1.0000 g | INTRAVENOUS | Status: DC
  Administered 2022-11-20: 1 g via INTRAVENOUS
  Filled 2022-11-20: qty 10

## 2022-11-20 MED ORDER — TAMSULOSIN HCL 0.4 MG PO CAPS
0.4000 mg | ORAL_CAPSULE | Freq: Every day | ORAL | Status: DC
  Administered 2022-11-20: 0.4 mg via ORAL
  Filled 2022-11-20: qty 1

## 2022-11-20 MED ORDER — ALPRAZOLAM 0.5 MG PO TABS: 0.5000 mg | ORAL_TABLET | Freq: Every day | ORAL | Status: DC | PRN

## 2022-11-20 MED ORDER — ASPIRIN 81 MG PO TBEC
81.0000 mg | DELAYED_RELEASE_TABLET | Freq: Every evening | ORAL | Status: DC
  Administered 2022-11-20: 81 mg via ORAL
  Filled 2022-11-20: qty 1

## 2022-11-20 MED ORDER — CARVEDILOL 12.5 MG PO TABS
25.0000 mg | ORAL_TABLET | Freq: Two times a day (BID) | ORAL | Status: DC
  Administered 2022-11-20 – 2022-11-21 (×3): 25 mg via ORAL
  Filled 2022-11-20 (×3): qty 2

## 2022-11-20 MED ORDER — INSULIN ASPART 100 UNIT/ML IJ SOLN: 0.0000 [IU] | Freq: Every day | INTRAMUSCULAR | Status: DC

## 2022-11-20 MED ORDER — ACETAMINOPHEN 650 MG RE SUPP: 650.0000 mg | Freq: Four times a day (QID) | RECTAL | Status: DC | PRN

## 2022-11-20 MED ORDER — ONDANSETRON HCL 4 MG/2ML IJ SOLN: 4.0000 mg | Freq: Four times a day (QID) | INTRAMUSCULAR | Status: DC | PRN

## 2022-11-20 MED ORDER — LEVOTHYROXINE SODIUM 100 MCG PO TABS
200.0000 ug | ORAL_TABLET | Freq: Every day | ORAL | Status: DC
  Administered 2022-11-20 – 2022-11-21 (×2): 200 ug via ORAL
  Filled 2022-11-20 (×2): qty 2

## 2022-11-20 MED ORDER — INSULIN GLARGINE-YFGN 100 UNIT/ML ~~LOC~~ SOLN
10.0000 [IU] | Freq: Every day | SUBCUTANEOUS | Status: DC
  Administered 2022-11-20: 10 [IU] via SUBCUTANEOUS
  Filled 2022-11-20 (×2): qty 0.1

## 2022-11-20 MED ORDER — ONDANSETRON HCL 4 MG PO TABS: 4.0000 mg | ORAL_TABLET | Freq: Four times a day (QID) | ORAL | Status: DC | PRN

## 2022-11-20 MED ORDER — OXYCODONE-ACETAMINOPHEN 5-325 MG PO TABS
1.0000 | ORAL_TABLET | ORAL | Status: DC | PRN
  Administered 2022-11-20 – 2022-11-21 (×3): 1 via ORAL
  Filled 2022-11-20 (×3): qty 1

## 2022-11-20 MED ORDER — ROSUVASTATIN CALCIUM 10 MG PO TABS
5.0000 mg | ORAL_TABLET | Freq: Every day | ORAL | Status: DC
  Administered 2022-11-20 – 2022-11-21 (×2): 5 mg via ORAL
  Filled 2022-11-20 (×2): qty 1

## 2022-11-20 MED ORDER — DOCUSATE SODIUM 100 MG PO CAPS
100.0000 mg | ORAL_CAPSULE | Freq: Two times a day (BID) | ORAL | Status: DC | PRN
  Administered 2022-11-20: 100 mg via ORAL
  Filled 2022-11-20: qty 1

## 2022-11-20 MED ORDER — SODIUM CHLORIDE 0.9 % IV SOLN
1.0000 g | Freq: Once | INTRAVENOUS | Status: AC
  Administered 2022-11-20: 1 g via INTRAVENOUS
  Filled 2022-11-20: qty 10

## 2022-11-20 MED ORDER — INSULIN ASPART 100 UNIT/ML IJ SOLN
0.0000 [IU] | Freq: Three times a day (TID) | INTRAMUSCULAR | Status: DC
  Administered 2022-11-20: 3 [IU] via SUBCUTANEOUS
  Administered 2022-11-20 (×2): 2 [IU] via SUBCUTANEOUS
  Administered 2022-11-21: 1 [IU] via SUBCUTANEOUS

## 2022-11-20 MED ORDER — HYDRALAZINE HCL 25 MG PO TABS
100.0000 mg | ORAL_TABLET | Freq: Three times a day (TID) | ORAL | Status: DC
  Administered 2022-11-20 – 2022-11-21 (×3): 100 mg via ORAL
  Filled 2022-11-20 (×3): qty 4

## 2022-11-20 MED ORDER — SODIUM CHLORIDE 0.9 % IV SOLN: INTRAVENOUS | Status: AC

## 2022-11-20 NOTE — ED Notes (Signed)
Hospitalist in room.

## 2022-11-20 NOTE — ED Provider Notes (Signed)
Care of the patient assumed at the change of shift pending call back from Hospitalist for admission. Spoke with Dr. Josephine Cables, who will evaluate for admission.    Truddie Hidden, MD 11/20/22 [phone]

## 2022-11-20 NOTE — TOC Progression Note (Signed)
  Transition of Care [facility]) Screening Note   Patient Details  Name: Brandon Warren Date of Birth: 1951/10/02   Transition of Care [facility] CM/SW Contact:    Shade Flood, LCSW Phone Number: 11/20/2022, 10:13 AM    Transition of Care Department [facility]) has reviewed patient and no TOC needs have been identified at this time. We will continue to monitor patient advancement through interdisciplinary progression rounds. If new patient transition needs arise, please place a TOC consult.

## 2022-11-20 NOTE — H&P (Signed)
History and Physical    Patient: Brandon Warren WVP:[id number] DOB: 07-Jan-1951 DOA: 11/19/2022 DOS: the patient was seen and examined on 11/20/2022 PCP: Redmond School, MD  Patient coming from: Home  Chief Complaint:  Chief Complaint  Patient presents with   Hematuria   HPI: Brandon Warren is a 72 y.o. male with medical history significant of hypertension, hyperlipidemia, hypothyroidism, BPH, CAD s/p stent placement, type 2 diabetes mellitus, obesity who presents to the emergency department due to blood in urine noted yesterday.  Patient states that he was recently started on medication for BPH, he noted that within the last 3 months, his urinary output has been decreasing and frequency also increasing and within the last few days he noted worsening urinary output and increased frequency and yesterday, he noted blood in his urine.  He was scared, so he called Dr. Theador Hawthorne who asked him to go to the ED for further evaluation and management.  He denies fever, chills, chest pain, shortness of breath, nausea, vomiting.  ED Course:  In the emergency department, BP was 160/91 and other vital signs were within normal range.  Workup in the ED showed normal CBC except for leukocytosis with a left shift.  BMP was normal except for blood glucose of 185 and BUN/creatinine 37/2.76 (baseline creatinine at 2.0).  Lactic acid 2.2 > 1.6, urinalysis was positive for hematuria and proteinuria.  Blood culture pending. CT abdomen and pelvis without contrast showed: 1.  Findings consistent with acute cystitis 2.  Colonic diverticulosis. 3. Grade 2 anterolisthesis of the L5 vertebral body on S1 with marked severity multilevel degenerative changes throughout the lumbar spine. 4. 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 Correlation with nonemergent splenic ultrasound is recommended. 5. Aortic atherosclerosis. Patient was started on IV ceftriaxone, Percocet was given and IV hydration was  provided.  Hospitalist was asked to admit patient for further evaluation and management.  Review of Systems: Review of systems as noted in the HPI. All other systems reviewed and are negative.   Past Medical History:  Diagnosis Date   Anemia    Arthritis    Back pain    BPH (benign prostatic hyperplasia)    CAD S/P percutaneous coronary angioplasty    a. 2005: PCI OM - Zomax study stent DES  2.5 x16 mm. b. cath in 02/2018 showing 20% ISR of LCx stent and 20% stenosis along mid-distal LM.    CKD (chronic kidney disease)    Controlled type 2 diabetes with neuropathy (HCC)    Diastolic heart failure (HCC)    Grade 1   Edema of both lower extremities    Food allergy    Gout    Hypercholesterolemia    Hypertension    Hypothyroidism    Kidney disease    MI (myocardial infarction) ([place])    in his 50s   Sleep apnea    SOB (shortness of breath)    Past Surgical History:  Procedure Laterality Date   APPENDECTOMY     CATARACT EXTRACTION Left    COLONOSCOPY N/A 02/01/2014   Procedure: COLONOSCOPY;  Surgeon: Daneil Dolin, MD;  Location: [facility];  Service: Endoscopy;  Laterality: N/A;  8:30   CORONARY ANGIOPLASTY WITH STENT PLACEMENT  2005   PCI- OM1 Zomax study stent 2.5 x16 mm.   LEFT HEART CATH AND CORONARY ANGIOGRAPHY N/A 03/15/2018   Procedure: LEFT HEART CATH AND CORONARY ANGIOGRAPHY;  Surgeon: Leonie Man, MD;  Location: [facility];  Service: Cardiovascular;  Laterality: N/A;    Social History:  reports that he quit smoking about 18 years ago. His smoking use included cigarettes. He has a 60.00 pack-year smoking history. He has never used smokeless tobacco. He reports current alcohol use. He reports that he does not use drugs.   Allergies  Allergen Reactions   Meat Extract Diarrhea, Nausea Only and Other (See Comments)    alphagal reaction   Lipitor [Atorvastatin] Other (See Comments)    Muscle cramps   Simvastatin Other (See Comments)    Muscle cramps     Allopurinol Rash    Family History  Problem Relation Age of Onset   Colon polyps Mother    Hypertension Mother    High Cholesterol Father    Heart disease Father    COPD Father    Thyroid disease Daughter    Healthy Daughter    Healthy Other    Colon cancer Neg Hx      Prior to Admission medications   Medication Sig Start Date End Date Taking? Authorizing Provider  acetaminophen (TYLENOL) 500 MG tablet Take 500 mg by mouth 2 (two) times daily as needed (for pain.). Patient not taking: Reported on 10/07/2022    [provider]  ALPRAZolam Duanne Moron) 0.5 MG tablet Take 0.5 mg by mouth daily as needed for anxiety.    [provider]  aspirin EC 81 MG tablet Take 81 mg by mouth every evening.    [provider]  bromocriptine (PARLODEL) 2.5 MG tablet Take 0.5 tablets (1.25 mg total) by mouth at bedtime. Patient not taking: Reported on 10/07/2022 08/12/21   Renato Shin, MD  carvedilol (COREG) 25 MG tablet TAKE ONE AND ONE-HALF TABLET (37.'5MG'$  TOTAL) BY MOUTH TWO TIMES A DAY 10/25/21   Arnoldo Lenis, MD  Cholecalciferol (VITAMIN D3) 5000 units CAPS Take 1 capsule (5,000 Units total) by mouth daily. 09/30/17   Cassandria Anger, MD  diazepam (VALIUM) 10 MG tablet Take 10 mg by mouth daily as needed (muscle spasms).    [provider]  diltiazem (CARDIZEM) 30 MG tablet Take 1 tablet (30 mg total) by mouth 2 (two) times daily. 11/15/21   Arnoldo Lenis, MD  EPINEPHrine 0.3 mg/0.3 mL IJ SOAJ injection Inject 0.3 mg into the muscle as needed for anaphylaxis.    [provider]  ezetimibe (ZETIA) 10 MG tablet Take 10 mg by mouth at bedtime. Patient not taking: Reported on 10/07/2022    [provider]  Febuxostat 80 MG TABS TAKE ONE TABLET (80 MG TOTAL) BY MOUTH DAILY. 03/26/22   Rice, Resa Miner, MD  ferrous sulfate 325 (65 FE) MG tablet Take 325 mg by mouth 3 (three) times daily.    [provider]  finasteride  (PROSCAR) 5 MG tablet Take 5 mg by mouth daily.     [provider]  furosemide (LASIX) 20 MG tablet Take 20 mg by mouth. 08/28/22   [provider]  hydrALAZINE (APRESOLINE) 100 MG tablet TAKE ONE TABLET ('100MG'$  TOTAL) BY MOUTH THREE TIMES DAILY 02/25/22   Arnoldo Lenis, MD  JARDIANCE 10 MG TABS tablet Take 10 mg by mouth daily.    [provider]  KERENDIA 10 MG TABS Take 1 tablet by mouth daily.    [provider]  levothyroxine (SYNTHROID) 25 MCG tablet Take 25 mcg by mouth daily before breakfast. Take with 200 mg    [provider]  levothyroxine (SYNTHROID, LEVOTHROID) 200 MCG tablet TAKE ONE  TABLET BY MOUTH EVERY DAY BEFORE BREAKFAST Patient taking differently: Take 200 mcg by mouth daily before breakfast. Take with 25 mcg to equal 225 mcg daily 01/19/19   Cassandria Anger, MD  losartan (COZAAR) 100 MG tablet TAKE ONE TABLET ('100MG'$  TOTAL) BY MOUTH DAILY 05/02/19   Nida, Marella Chimes, MD  MITIGARE 0.6 MG CAPS Take 0.6 mg by mouth 2 (two) times daily as needed (gout flare). 11/29/20   [provider]  omega-3 acid ethyl esters (LOVAZA) 1 g capsule TAKE ONE CAPSULE BY MOUTH EVERY 12 HOURS 05/02/19   Nida, Marella Chimes, MD  rosuvastatin (CRESTOR) 5 MG tablet Take 5 mg by mouth daily. 02/25/22   [provider]  Semaglutide, 2 MG/DOSE, (OZEMPIC, 2 MG/DOSE,) 8 MG/3ML SOPN Inject into the skin. 11/28/21   [provider]  sodium bicarbonate 650 MG tablet Take 650 mg by mouth 3 (three) times daily. 09/04/21   [provider]  tamsulosin (FLOMAX) 0.4 MG CAPS capsule TAKE ONE (1) CAPSULE BY MOUTH TWICE A DAY. (EVERY 12 HOURS.) 01/07/22   McKenzie, Candee Furbish, MD  TRADJENTA 5 MG TABS tablet Take 5 mg by mouth daily. Patient not taking: Reported on 10/07/2022 10/15/21   [provider]    Physical Exam: BP (!) 144/96 (BP Location: Right Arm)   Pulse 83   Temp 98.2 F (36.8 C) (Oral)   Resp 16   Ht '5\' 9"'$  (1.753  m)   Wt 122.5 kg   SpO2 97%   BMI 39.87 kg/m   General: 72 y.o. year-old male well developed well nourished in no acute distress.  Alert and oriented x3. HEENT: NCAT, EOMI Neck: Supple, trachea medial Cardiovascular: Regular rate and rhythm with no rubs or gallops.  No thyromegaly or JVD noted.  No lower extremity edema. 2/4 pulses in all 4 extremities. Respiratory: Clear to auscultation with no wheezes or rales. Good inspiratory effort. Abdomen: Soft, nontender nondistended with normal bowel sounds x4 quadrants. Muskuloskeletal: No cyanosis, clubbing or edema noted bilaterally Neuro: CN II-XII intact, strength 5/5 x 4, sensation, reflexes intact Skin: No ulcerative lesions noted or rashes Psychiatry: Judgement and insight appear normal. Mood is appropriate for condition and setting          Labs on Admission:  Basic Metabolic Panel: Recent Labs  Lab 11/19/22 2205  NA 135  K 4.5  CL 98  CO2 22  GLUCOSE 185*  BUN 37*  CREATININE 2.76*  CALCIUM 9.2   Liver Function Tests: Recent Labs  Lab 11/19/22 2205  AST 20  ALT 23  ALKPHOS 64  BILITOT 0.8  PROT 7.9  ALBUMIN 3.7   No results for input(s): "LIPASE", "AMYLASE" in the last 168 hours. No results for input(s): "AMMONIA" in the last 168 hours. CBC: Recent Labs  Lab 11/19/22 2205  WBC 22.0*  NEUTROABS 19.2*  HGB 13.6  HCT 41.2  MCV 86.9  PLT 261   Cardiac Enzymes: No results for input(s): "CKTOTAL", "CKMB", "CKMBINDEX", "TROPONINI" in the last 168 hours.  BNP (last 3 results) No results for input(s): "BNP" in the last 8760 hours.  ProBNP (last 3 results) No results for input(s): "PROBNP" in the last 8760 hours.  CBG: No results for input(s): "GLUCAP" in the last 168 hours.  Radiological Exams on Admission: CT Renal Stone Study  Result Date: 11/19/2022 CLINICAL DATA:  Hematuria. EXAM: CT ABDOMEN AND PELVIS WITHOUT CONTRAST TECHNIQUE: Multidetector CT imaging of the abdomen and pelvis was performed  following the standard protocol without  IV contrast. RADIATION DOSE REDUCTION: This exam was performed according to the departmental dose-optimization program which includes automated exposure control, adjustment of the mA and/or kV according to patient size and/or use of iterative reconstruction technique. COMPARISON:  None Available. FINDINGS: Lower chest: No acute abnormality. Hepatobiliary: No focal liver abnormality is seen. The gallbladder is moderately distended. No gallstones, gallbladder wall thickening, or biliary dilatation. Pancreas: Punctate calcifications are seen scattered throughout the pancreatic parenchyma. No pancreatic ductal dilatation or surrounding inflammatory changes. Spleen: A 2.1 cm diameter area of low attenuation is within an otherwise normal-appearing spleen. Adrenals/Urinary Tract: Adrenal glands are unremarkable. Kidneys are normal in size, without renal calculi, focal lesion, or hydronephrosis. The urinary bladder is poorly distended and subsequently limited in evaluation. Moderate severity diffuse urinary bladder wall thickening is noted. Very mild hazy surrounding inflammatory fat stranding is seen. Stomach/Bowel: Stomach is within normal limits. The appendix is not identified. No evidence of bowel wall thickening, distention, or inflammatory changes. Noninflamed diverticula are seen throughout the descending and sigmoid colon. Vascular/Lymphatic: Aortic atherosclerosis. No enlarged abdominal or pelvic lymph nodes. Reproductive: The prostate gland is mildly enlarged. Mild prostate gland calcification is also noted. Other: No abdominal wall hernia or abnormality. No abdominopelvic ascites. Musculoskeletal: There is grade 2 anterolisthesis of the L5 vertebral body on S1 with marked severity multilevel degenerative changes noted throughout the lumbar spine. IMPRESSION: 1. Findings consistent with acute cystitis. Correlation with urinalysis is recommended. 2. Colonic diverticulosis. 3.  Grade 2 anterolisthesis of the L5 vertebral body on S1 with marked severity multilevel degenerative changes throughout the lumbar spine. 4. 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 Correlation with nonemergent splenic ultrasound is recommended. 5. Aortic atherosclerosis. Aortic Atherosclerosis (ICD10-I70.0). Electronically Signed   By: Virgina Norfolk M.D.   On: 11/19/2022 23:38    EKG: I independently viewed the EKG done and my findings are as followed:  Normal sinus rhythm at a rate of 83 bpm  Assessment/Plan Present on Admission:  Acute cystitis  Acquired hypothyroidism  Essential hypertension, benign  Principal Problem:   Acute cystitis Active Problems:   Essential hypertension, benign   Acquired hypothyroidism   Acute kidney injury superimposed on chronic kidney disease (HCC)   Mixed hyperlipidemia   Obesity (BMI 30-39.9)   CAD S/P percutaneous coronary angioplasty   Type 2 diabetes mellitus with hyperglycemia (HCC)   Lactic acidosis   BPH (benign prostatic hyperplasia)  Acute cystitis Urinalysis was positive for proteinuria and hematuria Workup showed leukocytosis with left shift Patient presents with increased urinary frequency with oliguria Continue IV ceftriaxone Continue Percocet as needed Urine and blood culture pending  Acute kidney injury on CKD stage IV BUN/creatinine 37/2.76 (baseline creatinine at 2.0) Continue gentle hydration Renally adjust medications, avoid nephrotoxic agents/dehydration/hypotension Consider nephrology consult for worsening kidney function  Lactic acidosis-resolved  Type 2 diabetes mellitus with hyperglycemia CBG 185, hemoglobin A1c on 10/30/2022 was 6.9 (Care Everywhere) Continue Semglee 30 units nightly and adjust dose accordingly Continue ISS and hypoglycemic protocol  Obesity (BMI 39.87) Continue diet and lifestyle modification  Essential hypertension Continue Coreg, hydralazine  Mixed  hyperlipidemia Continue Crestor  Acquired hypothyroidism Continue Synthroid  BPH Continue Flomax and finasteride  CAD s/p and placement Continue aspirin, Crestor,   DVT prophylaxis: SCDs  Code Status: Full code  Family Communication: None at bedside  Consults: None  Severity of Illness: The appropriate patient status for this patient is INPATIENT. Inpatient status is judged to be reasonable and necessary in order to provide the  required intensity of service to ensure the patient's safety. The patient's presenting symptoms, physical exam findings, and initial radiographic and laboratory data in the context of their chronic comorbidities is felt to place them at high risk for further clinical deterioration. Furthermore, it is not anticipated that the patient will be medically stable for discharge from the hospital within 2 midnights of admission.   * I certify that at the point of admission it is my clinical judgment that the patient will require inpatient hospital care spanning beyond 2 midnights from the point of admission due to high intensity of service, high risk for further deterioration and high frequency of surveillance required.*  Author: Bernadette Hoit, DO 11/20/2022 8:36 AM  For on call review [email or web address].

## 2022-11-20 NOTE — Progress Notes (Signed)
  PROGRESS NOTE  Patient admitted earlier this morning. See H&P.   Brandon Warren is a 72 y.o. male with medical history significant of hypertension, hyperlipidemia, hypothyroidism, BPH, CAD s/p stent placement, type 2 diabetes mellitus, obesity who presents to the emergency department due to blood in urine. Also with sharp intermittent pain in his groin, and burning with urination. In the ED, Cr 2.76, UA showed hematuria. CT A/P showed findings consistent with acute cystitis.  Patient was started on IV fluids and IV Rocephin and admitted to the hospital.  Patient seen and examined.  He states that his dysuria has improved since admission to the hospital.  Hematuria has cleared up now.  Still having some intermittent sharp pains in his groin.  Continue IV fluid and monitor creatinine Continue IV Rocephin Urine culture and blood cultures are pending   Status is: Inpatient Remains inpatient appropriate because: IV fluid and IV Rocephin   Dessa Phi, DO [facility] 11/20/2022, 9:57 AM  Available via Epic secure chat 7am-7pm After these hours, please refer to coverage provider listed on [email or web address]

## 2022-11-20 NOTE — Care Management CC44 (Signed)
Condition Code 44 Documentation Completed  Patient Details  Name: Brandon Warren MRN: [id number] Date of Birth: 1951-08-21   Condition Code 44 given:  Yes Patient signature on Condition Code 44 notice:  Yes Documentation of 2 MD's agreement:  Yes Code 44 added to claim:  Yes    Shade Flood, LCSW 11/20/2022, 3:48 PM

## 2022-11-20 NOTE — Care Management Obs Status (Signed)
[facility] NOTIFICATION   Patient Details  Name: Brandon Warren MRN: [id number] Date of Birth: October 08, 1951   Medicare Observation Status Notification Given:  Yes    Shade Flood, LCSW 11/20/2022, 3:47 PM

## 2022-11-21 DIAGNOSIS — N3001 Acute cystitis with hematuria: Secondary | ICD-10-CM | POA: Diagnosis not present

## 2022-11-21 LAB — BASIC METABOLIC PANEL
Anion gap: 10 (ref 5–15)
BUN: 35 mg/dL — ABNORMAL HIGH (ref 8–23)
CO2: 23 mmol/L (ref 22–32)
Calcium: 8.3 mg/dL — ABNORMAL LOW (ref 8.9–10.3)
Chloride: 104 mmol/L (ref 98–111)
Creatinine, Ser: 2.56 mg/dL — ABNORMAL HIGH (ref 0.61–1.24)
GFR, Estimated: 26 mL/min — ABNORMAL LOW (ref >60)
Glucose, Bld: 126 mg/dL — ABNORMAL HIGH (ref 70–99)
Potassium: 4 mmol/L (ref 3.5–5.1)
Sodium: 137 mmol/L (ref 135–145)

## 2022-11-21 LAB — CBC
HCT: 34.1 % — ABNORMAL LOW (ref 39.0–52.0)
Hemoglobin: 10.8 g/dL — ABNORMAL LOW (ref 13.0–17.0)
MCH: 28.4 pg (ref 26.0–34.0)
MCHC: 31.7 g/dL (ref 30.0–36.0)
MCV: 89.7 fL (ref 80.0–100.0)
Platelets: 182 10*3/uL (ref 150–400)
RBC: 3.8 MIL/uL — ABNORMAL LOW (ref 4.22–5.81)
RDW: 14.3 % (ref 11.5–15.5)
WBC: 10.9 10*3/uL — ABNORMAL HIGH (ref 4.0–10.5)
nRBC: 0 % (ref 0.0–0.2)

## 2022-11-21 LAB — GLUCOSE, CAPILLARY: Glucose-Capillary: 145 mg/dL — ABNORMAL HIGH (ref 70–99)

## 2022-11-21 MED ORDER — CEFADROXIL 500 MG PO CAPS: 500.0000 mg | ORAL_CAPSULE | Freq: Two times a day (BID) | ORAL | 0 refills | Status: DC

## 2022-11-21 MED ORDER — ONDANSETRON HCL 4 MG PO TABS: 4.0000 mg | ORAL_TABLET | Freq: Two times a day (BID) | ORAL | 0 refills | Status: AC | PRN

## 2022-11-21 NOTE — Discharge Summary (Addendum)
Physician Discharge Summary  Brandon Warren TFT:[id number] DOB: 06-10-1951 DOA: 11/19/2022  PCP: Redmond School, MD  Admit date: 11/19/2022 Discharge date: 11/21/2022  Admitted From: Home Disposition:  Home  Recommendations for Outpatient Follow-up:  Follow up with urology, outpatient referral placed Follow-up with your nephrologist  Discharge Condition: Stable  CODE STATUS: Full code Diet recommendation: Heart healthy/carb modified diet  Brief/Interim Summary: Brandon Warren is a 72 y.o. male with medical history significant of hypertension, hyperlipidemia, hypothyroidism, BPH, CAD s/p stent placement, type 2 diabetes mellitus, obesity who presents to the emergency department due to blood in urine. Also with sharp intermittent pain in his groin, and burning with urination. In the ED, Cr 2.76, UA showed hematuria. CT A/P showed findings consistent with acute cystitis.  Patient was started on IV fluids and IV Rocephin and admitted to the hospital.  Patient's hematuria and dysuria improved with IV Rocephin.  Urine culture returned negative growth.  Patient's creatinine stabilized to his baseline.  He was advised to follow-up with his urologist (last saw Dr. Alyson Ingles, [facility] in 2017-2018) as well as his nephrologist.  Discharge Diagnoses:   Principal Problem:   Acute cystitis Active Problems:   Essential hypertension, benign   Acquired hypothyroidism   Acute kidney injury superimposed on chronic kidney disease ([place])   Mixed hyperlipidemia   Obesity (BMI 30-39.9)   CAD S/P percutaneous coronary angioplasty   Type 2 diabetes mellitus with hyperglycemia (HCC)   Lactic acidosis   BPH (benign prostatic hyperplasia)    Discharge Instructions  Discharge Instructions     Ambulatory referral to Urology   Complete by: As directed    [facility] Has seen Dr. Nicolette Bang in the past, but would prefer a different provider if possible   Call MD for:  difficulty  breathing, headache or visual disturbances   Complete by: As directed    Call MD for:  extreme fatigue   Complete by: As directed    Call MD for:  persistant dizziness or light-headedness   Complete by: As directed    Call MD for:  persistant nausea and vomiting   Complete by: As directed    Call MD for:  severe uncontrolled pain   Complete by: As directed    Call MD for:  temperature >100.4   Complete by: As directed    Diet - low sodium heart healthy   Complete by: As directed    Diet Carb Modified   Complete by: As directed    Discharge instructions   Complete by: As directed    You were cared for by a hospitalist during your hospital stay. If you have any questions about your discharge medications or the care you received while you were in the hospital after you are discharged, you can call the unit and ask to speak with the hospitalist on call if the hospitalist that took care of you is not available. Once you are discharged, your primary care physician will handle any further medical issues. Please note that NO REFILLS for any discharge medications will be authorized once you are discharged, as it is imperative that you return to your primary care physician (or establish a relationship with a primary care physician if you do not have one) for your aftercare needs so that they can reassess your need for medications and monitor your lab values.   Increase activity slowly   Complete by: As directed       Allergies as of 11/21/2022  Reactions   Meat Extract Diarrhea, Nausea Only, Other (See Comments)   alphagal reaction   Lipitor [atorvastatin] Other (See Comments)   Muscle cramps   Simvastatin Other (See Comments)   Muscle cramps   Allopurinol Rash        Medication List     STOP taking these medications    bromocriptine 2.5 MG tablet Commonly known as: PARLODEL   Tradjenta 5 MG Tabs tablet Generic drug: linagliptin       TAKE these medications     acetaminophen 500 MG tablet Commonly known as: TYLENOL Take 500 mg by mouth 2 (two) times daily as needed (for pain.).   ALPRAZolam 0.5 MG tablet Commonly known as: XANAX Take 0.5 mg by mouth daily as needed for anxiety.   aspirin EC 81 MG tablet Take 81 mg by mouth every evening.   carvedilol 25 MG tablet Commonly known as: COREG TAKE ONE AND ONE-HALF TABLET (37.'5MG'$  TOTAL) BY MOUTH TWO TIMES A DAY   cefadroxil 500 MG capsule Commonly known as: DURICEF Take 1 capsule (500 mg total) by mouth 2 (two) times daily.   diazepam 10 MG tablet Commonly known as: VALIUM Take 10 mg by mouth daily as needed (muscle spasms).   diltiazem 30 MG tablet Commonly known as: Cardizem Take 1 tablet (30 mg total) by mouth 2 (two) times daily.   EPINEPHrine 0.3 mg/0.3 mL Soaj injection Commonly known as: EPI-PEN Inject 0.3 mg into the muscle as needed for anaphylaxis.   ezetimibe 10 MG tablet Commonly known as: ZETIA Take 10 mg by mouth at bedtime.   Febuxostat 80 MG Tabs TAKE ONE TABLET (80 MG TOTAL) BY MOUTH DAILY.   ferrous sulfate 325 (65 FE) MG tablet Take 325 mg by mouth 3 (three) times daily.   finasteride 5 MG tablet Commonly known as: PROSCAR Take 5 mg by mouth daily.   furosemide 20 MG tablet Commonly known as: LASIX Take 20 mg by mouth.   hydrALAZINE 100 MG tablet Commonly known as: APRESOLINE TAKE ONE TABLET ('100MG'$  TOTAL) BY MOUTH THREE TIMES DAILY   Jardiance 10 MG Tabs tablet Generic drug: empagliflozin Take 10 mg by mouth daily.   Kerendia 10 MG Tabs Generic drug: Finerenone Take 1 tablet by mouth daily.   levothyroxine 25 MCG tablet Commonly known as: SYNTHROID Take 25 mcg by mouth daily before breakfast. Take with 200 mg What changed: Another medication with the same name was changed. Make sure you understand how and when to take each.   levothyroxine 200 MCG tablet Commonly known as: SYNTHROID TAKE ONE TABLET BY MOUTH EVERY DAY BEFORE BREAKFAST What  changed: See the new instructions.   losartan 100 MG tablet Commonly known as: COZAAR TAKE ONE TABLET ('100MG'$  TOTAL) BY MOUTH DAILY   Mitigare 0.6 MG Caps Generic drug: Colchicine Take 0.6 mg by mouth 2 (two) times daily as needed (gout flare).   omega-3 acid ethyl esters 1 g capsule Commonly known as: LOVAZA TAKE ONE CAPSULE BY MOUTH EVERY 12 HOURS   ondansetron 4 MG tablet Commonly known as: Zofran Take 1 tablet (4 mg total) by mouth 2 (two) times daily as needed for up to 10 days for nausea or vomiting.   Ozempic (2 MG/DOSE) 8 MG/3ML Sopn Generic drug: Semaglutide (2 MG/DOSE) Inject into the skin.   rosuvastatin 5 MG tablet Commonly known as: CRESTOR Take 5 mg by mouth daily.   sodium bicarbonate 650 MG tablet Take 650 mg by mouth 3 (three) times daily.  tamsulosin 0.4 MG Caps capsule Commonly known as: FLOMAX TAKE ONE (1) CAPSULE BY MOUTH TWICE A DAY. (EVERY 12 HOURS.)   Vitamin D3 125 MCG (5000 UT) Caps Take 1 capsule (5,000 Units total) by mouth daily.        Follow-up Information     [facility] Follow up.   Why: Referral was placed at discharge Contact information: [place]        Liana Gerold, MD Follow up.   Specialty: Nephrology Contact information: [address] [place] [address] [phone]                Allergies  Allergen Reactions   Meat Extract Diarrhea, Nausea Only and Other (See Comments)    alphagal reaction   Lipitor [Atorvastatin] Other (See Comments)    Muscle cramps   Simvastatin Other (See Comments)    Muscle cramps    Allopurinol Rash      Procedures/Studies: CT Renal Stone Study  Result Date: 11/19/2022 CLINICAL DATA:  Hematuria. EXAM: CT ABDOMEN AND PELVIS WITHOUT CONTRAST TECHNIQUE: Multidetector CT imaging of the abdomen and pelvis was performed following the standard protocol without IV contrast. RADIATION DOSE  REDUCTION: This exam was performed according to the departmental dose-optimization program which includes automated exposure control, adjustment of the mA and/or kV according to patient size and/or use of iterative reconstruction technique. COMPARISON:  None Available. FINDINGS: Lower chest: No acute abnormality. Hepatobiliary: No focal liver abnormality is seen. The gallbladder is moderately distended. No gallstones, gallbladder wall thickening, or biliary dilatation. Pancreas: Punctate calcifications are seen scattered throughout the pancreatic parenchyma. No pancreatic ductal dilatation or surrounding inflammatory changes. Spleen: A 2.1 cm diameter area of low attenuation is within an otherwise normal-appearing spleen. Adrenals/Urinary Tract: Adrenal glands are unremarkable. Kidneys are normal in size, without renal calculi, focal lesion, or hydronephrosis. The urinary bladder is poorly distended and subsequently limited in evaluation. Moderate severity diffuse urinary bladder wall thickening is noted. Very mild hazy surrounding inflammatory fat stranding is seen. Stomach/Bowel: Stomach is within normal limits. The appendix is not identified. No evidence of bowel wall thickening, distention, or inflammatory changes. Noninflamed diverticula are seen throughout the descending and sigmoid colon. Vascular/Lymphatic: Aortic atherosclerosis. No enlarged abdominal or pelvic lymph nodes. Reproductive: The prostate gland is mildly enlarged. Mild prostate gland calcification is also noted. Other: No abdominal wall hernia or abnormality. No abdominopelvic ascites. Musculoskeletal: There is grade 2 anterolisthesis of the L5 vertebral body on S1 with marked severity multilevel degenerative changes noted throughout the lumbar spine. IMPRESSION: 1. Findings consistent with acute cystitis. Correlation with urinalysis is recommended. 2. Colonic diverticulosis. 3. Grade 2 anterolisthesis of the L5 vertebral body on S1 with marked  severity multilevel degenerative changes throughout the lumbar spine. 4. 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 Correlation with nonemergent splenic ultrasound is recommended. 5. Aortic atherosclerosis. Aortic Atherosclerosis (ICD10-I70.0). Electronically Signed   By: Virgina Norfolk M.D.   On: 11/19/2022 23:38       Discharge Exam: Vitals:   11/21/22 0317 11/21/22 0700  BP: (!) 144/60 (!) 154/77  Pulse: 65 74  Resp: 20 18  Temp: 98.1 F (36.7 C) 98.6 F (37 C)  SpO2: 97% 99%    General: Pt is alert, awake, not in acute distress Cardiovascular: RRR, S1/S2 +, no edema Respiratory: CTA bilaterally, no wheezing, no rhonchi, no respiratory distress, no conversational dyspnea  Abdominal: Soft, NT, ND, bowel sounds +  Extremities: no edema, no cyanosis Psych: Normal mood and affect, stable judgement and insight     The results of significant diagnostics from this hospitalization (including imaging, microbiology, ancillary and laboratory) are listed below for reference.     Microbiology: Recent Results (from the past 240 hour(s))  Urine Culture     Status: None   Collection Time: 11/19/22  8:48 PM   Specimen: Urine, Clean Catch  Result Value Ref Range Status   Specimen Description   Final    URINE, CLEAN CATCH Performed at [facility], [address], [place], [place] [address]    Special Requests   Final    NONE Performed at [facility], [address], [place], [place] [address]    Culture   Final    NO GROWTH Performed at [facility], [place] [address] [place], [place] [address]    Report Status 11/20/2022 FINAL  Final  Blood Culture (routine x 2)     Status: None (Preliminary result)   Collection Time: 11/19/22 11:28 PM   Specimen: BLOOD  Result Value Ref Range Status   Specimen Description BLOOD RIGHT ANTECUBITAL  Final   Special Requests   Final    BOTTLES DRAWN AEROBIC AND ANAEROBIC Blood Culture adequate volume   Culture   Final     NO GROWTH 2 DAYS Performed at [facility], [address] [place], [place] [address]    Report Status PENDING  Incomplete  Blood Culture (routine x 2)     Status: None (Preliminary result)   Collection Time: 11/19/22 11:47 PM   Specimen: BLOOD RIGHT FOREARM  Result Value Ref Range Status   Specimen Description BLOOD RIGHT FOREARM  Final   Special Requests BOTTLES DRAWN AEROBIC AND ANAEROBIC  Final   Culture   Final    NO GROWTH 2 DAYS Performed at [facility], [address], [place], [place] [address]    Report Status PENDING  Incomplete     Labs: BNP (last 3 results) No results for input(s): "BNP" in the last 8760 hours. Basic Metabolic Panel: Recent Labs  Lab 11/19/22 2205 11/20/22 0817 11/21/22 0401  NA 135  --  137  K 4.5  --  4.0  CL 98  --  104  CO2 22  --  23  GLUCOSE 185*  --  126*  BUN 37*  --  35*  CREATININE 2.76*  --  2.56*  CALCIUM 9.2  --  8.3*  MG  --  2.0  --   PHOS  --  3.1  --    Liver Function Tests: Recent Labs  Lab 11/19/22 2205  AST 20  ALT 23  ALKPHOS 64  BILITOT 0.8  PROT 7.9  ALBUMIN 3.7   No results for input(s): "LIPASE", "AMYLASE" in the last 168 hours. No results for input(s): "AMMONIA" in the last 168 hours. CBC: Recent Labs  Lab 11/19/22 2205 11/21/22 0401  WBC 22.0* 10.9*  NEUTROABS 19.2*  --   HGB 13.6 10.8*  HCT 41.2 34.1*  MCV 86.9 89.7  PLT 261 182   Cardiac Enzymes: No results for input(s): "CKTOTAL", "CKMB", "CKMBINDEX", "TROPONINI" in the last 168 hours. BNP: Invalid input(s): "POCBNP" CBG: Recent Labs  Lab 11/20/22 0840 11/20/22 1112 11/20/22 1647 11/20/22 2029 11/21/22 0735  GLUCAP 174* 216* 170* 168* 145*   D-Dimer No results for input(s): "DDIMER" in the last 72 hours. Hgb A1c Recent Labs    11/20/22 0817  HGBA1C 6.5*   Lipid  Profile No results for input(s): "CHOL", "HDL", "LDLCALC", "TRIG", "CHOLHDL", "LDLDIRECT" in the last 72 hours. Thyroid function studies No results for  input(s): "TSH", "T4TOTAL", "T3FREE", "THYROIDAB" in the last 72 hours.  Invalid input(s): "FREET3" Anemia work up No results for input(s): "VITAMINB12", "FOLATE", "FERRITIN", "TIBC", "IRON", "RETICCTPCT" in the last 72 hours. Urinalysis    Component Value Date/Time   COLORURINE AMBER (A) 11/19/2022 2048   APPEARANCEUR CLOUDY (A) 11/19/2022 2048   APPEARANCEUR Clear 12/04/2015 1038   LABSPEC 1.020 11/19/2022 2048   PHURINE 6.0 11/19/2022 2048   GLUCOSEU 50 (A) 11/19/2022 2048   HGBUR LARGE (A) 11/19/2022 2048   BILIRUBINUR NEGATIVE 11/19/2022 2048   BILIRUBINUR Negative 12/04/2015 1038   KETONESUR NEGATIVE 11/19/2022 2048   PROTEINUR >=300 (A) 11/19/2022 2048   NITRITE NEGATIVE 11/19/2022 2048   LEUKOCYTESUR MODERATE (A) 11/19/2022 2048   Sepsis Labs Recent Labs  Lab 11/19/22 2205 11/21/22 0401  WBC 22.0* 10.9*   Microbiology Recent Results (from the past 240 hour(s))  Urine Culture     Status: None   Collection Time: 11/19/22  8:48 PM   Specimen: Urine, Clean Catch  Result Value Ref Range Status   Specimen Description   Final    URINE, CLEAN CATCH Performed at [facility], [address] [place], [place] [address]    Special Requests   Final    NONE Performed at [facility], [address] [place], [place] [address]    Culture   Final    NO GROWTH Performed at [facility], [place] [address] [place], [place] [address]    Report Status 11/20/2022 FINAL  Final  Blood Culture (routine x 2)     Status: None (Preliminary result)   Collection Time: 11/19/22 11:28 PM   Specimen: BLOOD  Result Value Ref Range Status   Specimen Description BLOOD RIGHT ANTECUBITAL  Final   Special Requests   Final    BOTTLES DRAWN AEROBIC AND ANAEROBIC Blood Culture adequate volume   Culture   Final    NO GROWTH 2 DAYS Performed at [facility], [address], [place], [place] [address]    Report Status PENDING  Incomplete  Blood Culture (routine x 2)     Status: None  (Preliminary result)   Collection Time: 11/19/22 11:47 PM   Specimen: BLOOD RIGHT FOREARM  Result Value Ref Range Status   Specimen Description BLOOD RIGHT FOREARM  Final   Special Requests BOTTLES DRAWN AEROBIC AND ANAEROBIC  Final   Culture   Final    NO GROWTH 2 DAYS Performed at [facility], [address], [place], [place] [address]    Report Status PENDING  Incomplete     Patient was seen and examined on the day of discharge and was found to be in stable condition. Time coordinating discharge: 25 minutes including assessment and coordination of care, as well as examination of the patient.   SIGNED:  Dessa Phi, DO [facility] 11/21/2022, 9:51 AM

## 2022-11-22 ENCOUNTER — Other Ambulatory Visit: Payer: Self-pay | Admitting: Internal Medicine

## 2022-11-22 MED ORDER — CEFADROXIL 500 MG PO CAPS: 500.0000 mg | ORAL_CAPSULE | Freq: Two times a day (BID) | ORAL | 0 refills | Status: DC

## 2022-11-24 LAB — CULTURE, BLOOD (ROUTINE X 2)
Culture: NO GROWTH
Culture: NO GROWTH
Special Requests: ADEQUATE

## 2022-11-27 DIAGNOSIS — Z125 Encounter for screening for malignant neoplasm of prostate: Secondary | ICD-10-CM | POA: Diagnosis not present

## 2022-11-27 DIAGNOSIS — I129 Hypertensive chronic kidney disease with stage 1 through stage 4 chronic kidney disease, or unspecified chronic kidney disease: Secondary | ICD-10-CM | POA: Diagnosis not present

## 2022-11-27 DIAGNOSIS — E559 Vitamin D deficiency, unspecified: Secondary | ICD-10-CM | POA: Diagnosis not present

## 2022-11-27 DIAGNOSIS — Z0001 Encounter for general adult medical examination with abnormal findings: Secondary | ICD-10-CM | POA: Diagnosis not present

## 2022-11-27 DIAGNOSIS — E1129 Type 2 diabetes mellitus with other diabetic kidney complication: Secondary | ICD-10-CM | POA: Diagnosis not present

## 2022-11-27 DIAGNOSIS — E039 Hypothyroidism, unspecified: Secondary | ICD-10-CM | POA: Diagnosis not present

## 2022-11-27 DIAGNOSIS — N189 Chronic kidney disease, unspecified: Secondary | ICD-10-CM | POA: Diagnosis not present

## 2022-11-27 DIAGNOSIS — D518 Other vitamin B12 deficiency anemias: Secondary | ICD-10-CM | POA: Diagnosis not present

## 2022-11-27 DIAGNOSIS — R808 Other proteinuria: Secondary | ICD-10-CM | POA: Diagnosis not present

## 2022-11-27 DIAGNOSIS — E1122 Type 2 diabetes mellitus with diabetic chronic kidney disease: Secondary | ICD-10-CM | POA: Diagnosis not present

## 2022-11-30 IMAGING — US US BIOPSY
1 series · 5 of 5 positions shown · non-contrast
Comparison: Ultrasound 05/13/2021

CLINICAL DATA: Renal insufficiency, biopsy requested

EXAM:
ULTRASOUND GUIDED RENAL CORE BIOPSY
TECHNIQUE: Survey ultrasound was performed and an appropriate skin entry site
was localized. Site was marked, prepped with Betadine, draped in
usual sterile fashion, infiltrated locally with 1% lidocaine.

[Series 1: us biopsy (kidney) · 5 of 5 slices shown]
[im 1/5]
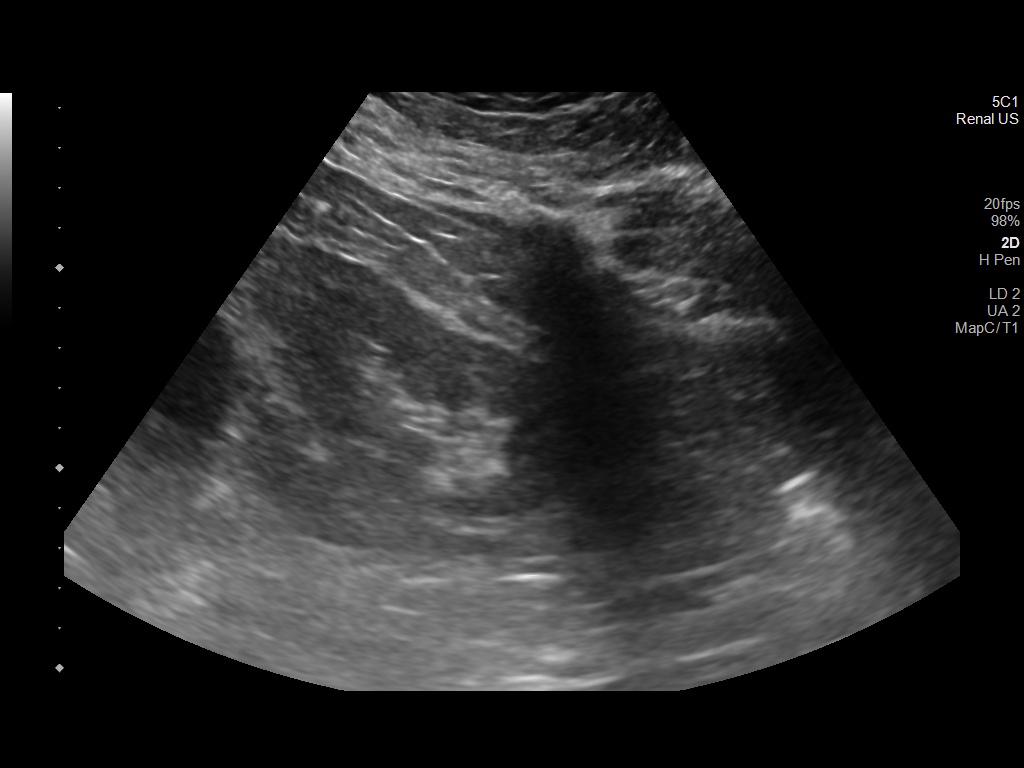
[im 2/5]
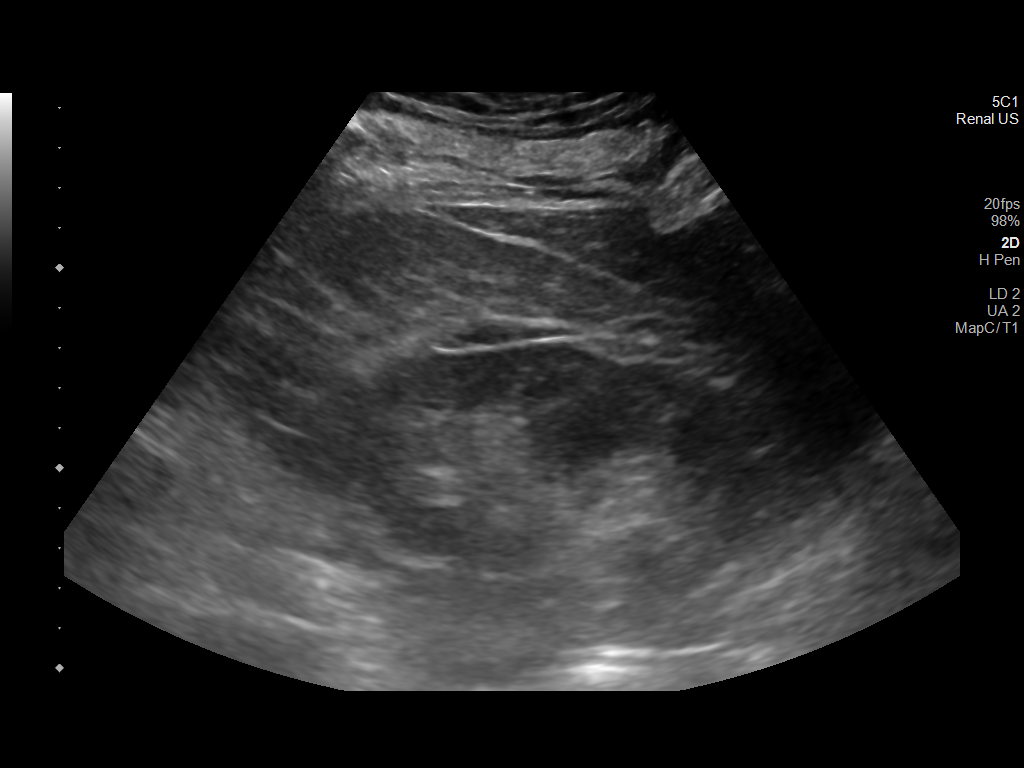
[im 3/5]
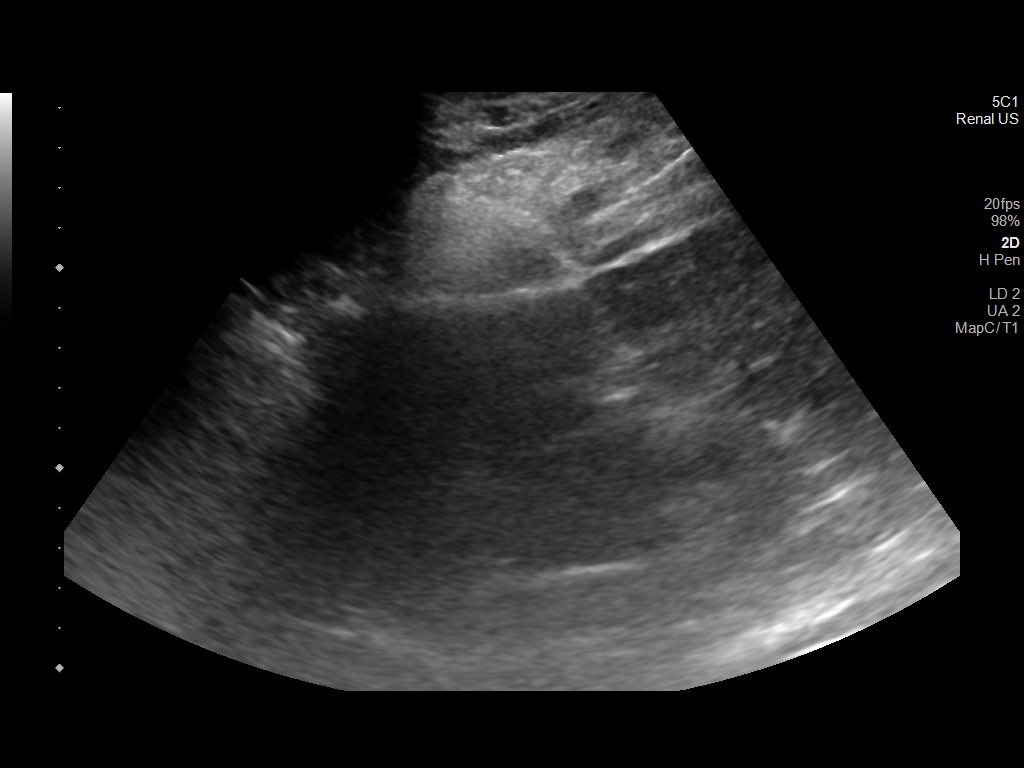
[im 4/5]
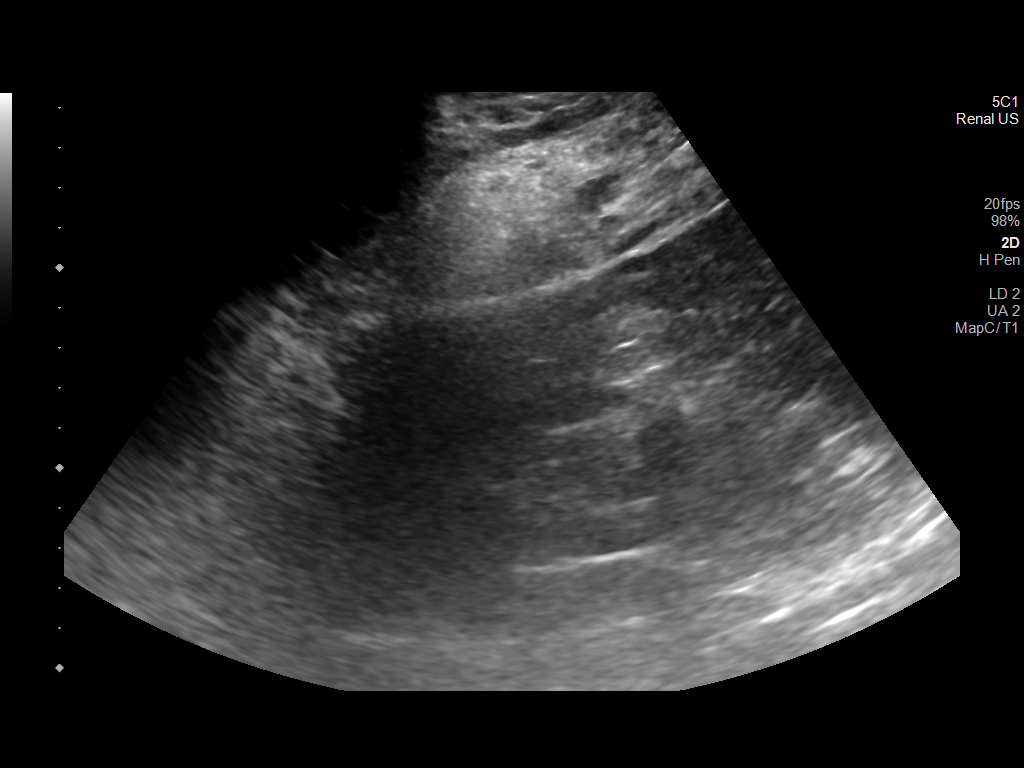
[im 5/5]
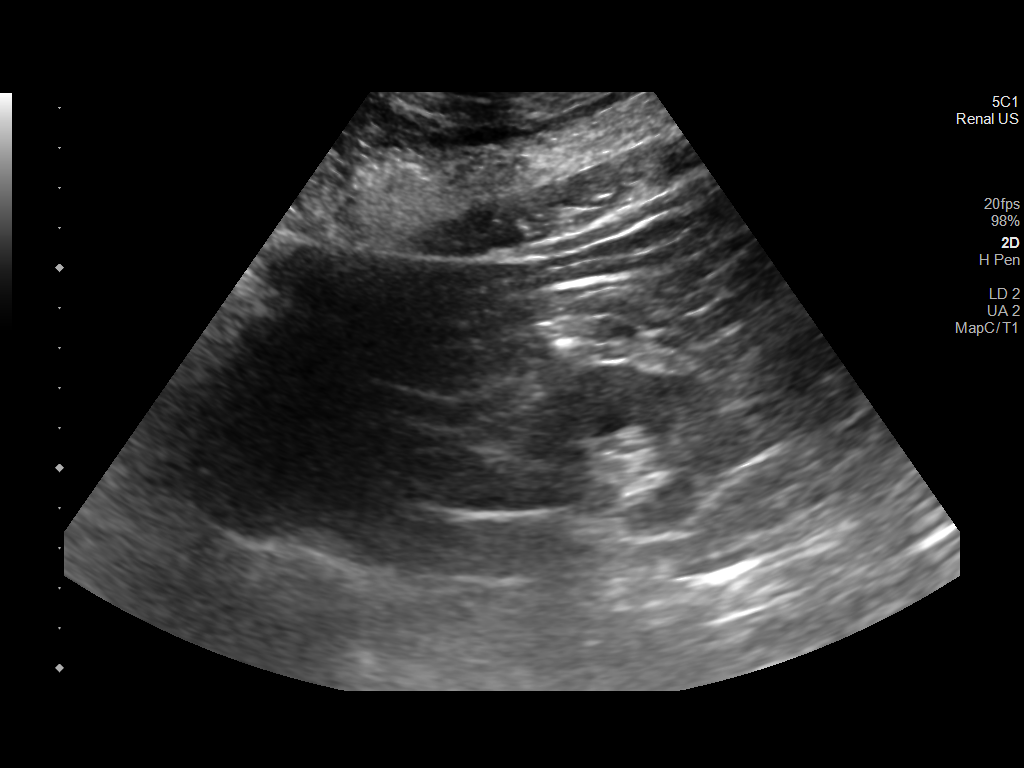

[5 of 5 positions shown; findings below may reference images not displayed]

Intravenous Fentanyl 71mcg and Versed 1.5mg were administered as
conscious sedation during continuous monitoring of the patient's
level of consciousness and physiological / cardiorespiratory status
by the radiology RN, with a total moderate sedation time of 10
minutes.

Under real time ultrasound guidance, a 15 gauge trocar needle was
advanced to the margin of the lower pole of the right kidney for 2
coaxial 16 gauge core biopsy needle passes. The core samples were
submitted to pathology. The patient tolerated procedure well.

COMPLICATIONS:
None immediate
IMPRESSION: 1. Technically successful ultrasound-guided core renal biopsy ,
right lower pole.

## 2022-12-01 DIAGNOSIS — R808 Other proteinuria: Secondary | ICD-10-CM | POA: Diagnosis not present

## 2022-12-01 DIAGNOSIS — D638 Anemia in other chronic diseases classified elsewhere: Secondary | ICD-10-CM | POA: Diagnosis not present

## 2022-12-01 DIAGNOSIS — R3914 Feeling of incomplete bladder emptying: Secondary | ICD-10-CM | POA: Diagnosis not present

## 2022-12-01 DIAGNOSIS — N3091 Cystitis, unspecified with hematuria: Secondary | ICD-10-CM | POA: Diagnosis not present

## 2022-12-01 DIAGNOSIS — N041 Nephrotic syndrome with focal and segmental glomerular lesions: Secondary | ICD-10-CM | POA: Diagnosis not present

## 2022-12-01 DIAGNOSIS — I129 Hypertensive chronic kidney disease with stage 1 through stage 4 chronic kidney disease, or unspecified chronic kidney disease: Secondary | ICD-10-CM | POA: Diagnosis not present

## 2022-12-01 DIAGNOSIS — I5032 Chronic diastolic (congestive) heart failure: Secondary | ICD-10-CM | POA: Diagnosis not present

## 2022-12-01 DIAGNOSIS — N189 Chronic kidney disease, unspecified: Secondary | ICD-10-CM | POA: Diagnosis not present

## 2022-12-01 DIAGNOSIS — E8722 Chronic metabolic acidosis: Secondary | ICD-10-CM | POA: Diagnosis not present

## 2022-12-04 ENCOUNTER — Other Ambulatory Visit: Payer: Self-pay | Admitting: Cardiology

## 2022-12-09 ENCOUNTER — Encounter: Payer: Self-pay | Admitting: Urology

## 2022-12-09 ENCOUNTER — Ambulatory Visit (INDEPENDENT_AMBULATORY_CARE_PROVIDER_SITE_OTHER): Payer: Medicare HMO | Admitting: Urology

## 2022-12-09 VITALS — BP 186/76 | HR 89

## 2022-12-09 DIAGNOSIS — N401 Enlarged prostate with lower urinary tract symptoms: Secondary | ICD-10-CM | POA: Diagnosis not present

## 2022-12-09 DIAGNOSIS — R310 Gross hematuria: Secondary | ICD-10-CM

## 2022-12-09 DIAGNOSIS — R339 Retention of urine, unspecified: Secondary | ICD-10-CM

## 2022-12-09 DIAGNOSIS — R350 Frequency of micturition: Secondary | ICD-10-CM | POA: Diagnosis not present

## 2022-12-09 DIAGNOSIS — N184 Chronic kidney disease, stage 4 (severe): Secondary | ICD-10-CM | POA: Diagnosis not present

## 2022-12-09 LAB — BLADDER SCAN AMB NON-IMAGING: Scan Result: 91

## 2022-12-09 MED ORDER — SILODOSIN 8 MG PO CAPS: 8.0000 mg | ORAL_CAPSULE | Freq: Every day | ORAL | 11 refills | Status: DC

## 2022-12-09 NOTE — Patient Instructions (Signed)
Benign Prostatic Hyperplasia ? ?Benign prostatic hyperplasia (BPH) is an enlarged prostate gland that is caused by the normal aging process. The prostate may get bigger as a man gets older. The condition is not caused by cancer. The prostate is a walnut-sized gland that is involved in the production of semen. It is located in front of the rectum and below the bladder. The bladder stores urine. The urethra carries stored urine out of the body. ?An enlarged prostate can press on the urethra. This can make it harder to pass urine. The buildup of urine in the bladder can cause infection. Back pressure and infection may progress to bladder damage and kidney (renal) failure. ?What are the causes? ?This condition is part of the normal aging process. However, not all men develop problems from this condition. If the prostate enlarges away from the urethra, urine flow will not be blocked. If it enlarges toward the urethra and compresses it, there will be problems passing urine. ?What increases the risk? ?This condition is more likely to develop in men older than 50 years. ?What are the signs or symptoms? ?Symptoms of this condition include: ?Getting up often during the night to urinate. ?Needing to urinate frequently during the day. ?Difficulty starting urine flow. ?Decrease in size and strength of your urine stream. ?Leaking (dribbling) after urinating. ?Inability to pass urine. This needs immediate treatment. ?Inability to completely empty your bladder. ?Pain when you pass urine. This is more common if there is also an infection. ?Urinary tract infection (UTI). ?How is this diagnosed? ?This condition is diagnosed based on your medical history, a physical exam, and your symptoms. Tests will also be done, such as: ?A post-void bladder scan. This measures any amount of urine that may remain in your bladder after you finish urinating. ?A digital rectal exam. In a rectal exam, your health care provider checks your prostate by  putting a lubricated, gloved finger into your rectum to feel the back of your prostate gland. This exam detects the size of your gland and any abnormal lumps or growths. ?An exam of your urine (urinalysis). ?A prostate specific antigen (PSA) screening. This is a blood test used to screen for prostate cancer. ?An ultrasound. This test uses sound waves to electronically produce a picture of your prostate gland. ?Your health care provider may refer you to a specialist in kidney and prostate diseases (urologist). ?How is this treated? ?Once symptoms begin, your health care provider will monitor your condition (active surveillance or watchful waiting). Treatment for this condition will depend on the severity of your condition. Treatment may include: ?Observation and yearly exams. This may be the only treatment needed if your condition and symptoms are mild. ?Medicines to relieve your symptoms, including: ?Medicines to shrink the prostate. ?Medicines to relax the muscle of the prostate. ?Surgery in severe cases. Surgery may include: ?Prostatectomy. In this procedure, the prostate tissue is removed completely through an open incision or with a laparoscope or robotics. ?Transurethral resection of the prostate (TURP). In this procedure, a tool is inserted through the opening at the tip of the penis (urethra). It is used to cut away tissue of the inner core of the prostate. The pieces are removed through the same opening of the penis. This removes the blockage. ?Transurethral incision (TUIP). In this procedure, small cuts are made in the prostate. This lessens the prostate's pressure on the urethra. ?Transurethral microwave thermotherapy (TUMT). This procedure uses microwaves to create heat. The heat destroys and removes a small amount of   prostate tissue. ?Transurethral needle ablation (TUNA). This procedure uses radio frequencies to destroy and remove a small amount of prostate tissue. ?Interstitial laser coagulation (ILC).  This procedure uses a laser to destroy and remove a small amount of prostate tissue. ?Transurethral electrovaporization (TUVP). This procedure uses electrodes to destroy and remove a small amount of prostate tissue. ?Prostatic urethral lift. This procedure inserts an implant to push the lobes of the prostate away from the urethra. ?Follow these instructions at home: ?Take over-the-counter and prescription medicines only as told by your health care provider. ?Monitor your symptoms for any changes. Contact your health care provider with any changes. ?Avoid drinking large amounts of liquid before going to bed or out in public. ?Avoid or reduce how much caffeine or alcohol you drink. ?Give yourself time when you urinate. ?Keep all follow-up visits. This is important. ?Contact a health care provider if: ?You have unexplained back pain. ?Your symptoms do not get better with treatment. ?You develop side effects from the medicine you are taking. ?Your urine becomes very dark or has a bad smell. ?Your lower abdomen becomes distended and you have trouble passing urine. ?Get help right away if: ?You have a fever or chills. ?You suddenly cannot urinate. ?You feel light-headed or very dizzy, or you faint. ?There are large amounts of blood or clots in your urine. ?Your urinary problems become hard to manage. ?You develop moderate to severe low back or flank pain. The flank is the side of your body between the ribs and the hip. ?These symptoms may be an emergency. Get help right away. Call 911. ?Do not wait to see if the symptoms will go away. ?Do not drive yourself to the hospital. ?Summary ?Benign prostatic hyperplasia (BPH) is an enlarged prostate that is caused by the normal aging process. It is not caused by cancer. ?An enlarged prostate can press on the urethra. This can make it hard to pass urine. ?This condition is more likely to develop in men older than 50 years. ?Get help right away if you suddenly cannot urinate. ?This  information is not intended to replace advice given to you by your health care provider. Make sure you discuss any questions you have with your health care provider. ?Document Revised: 05/01/2021 Document Reviewed: 05/01/2021 ?Elsevier Patient Education ? 2023 [employer] ? ?

## 2022-12-09 NOTE — Progress Notes (Signed)
12/09/2022 12:47 PM   Brandon Warren Nov 13, 1950 [id number]  Referring provider: Dessa Phi, DO [place],  [place] [id number]  Difficulty urinating and gross hematuria   HPI: Brandon Warren is a 72yo here for evaluation of gross hematuria, UTI, and BPH. He was last seen in 2017 for BPH and elevated PSA. He had a negative biopsy in 2017. 3 weeks ago he developed worsening LUTS and then gross hematuria. He then presented to the ER and was hospitalized for UTI, AKI. Jardiance was stopped at that time. IPSS 18 QOL 4 on flomax BID and finasteride. His urine stream is weak and nocturia 4-5x. He has OSA but does not wear his CPAP.  PSA 5.4 on finasteride.    PMH: Past Medical History:  Diagnosis Date   Anemia    Arthritis    Back pain    BPH (benign prostatic hyperplasia)    CAD S/P percutaneous coronary angioplasty    a. 2005: PCI OM - Zomax study stent DES  2.5 x16 mm. b. cath in 02/2018 showing 20% ISR of LCx stent and 20% stenosis along mid-distal LM.    CKD (chronic kidney disease)    Controlled type 2 diabetes with neuropathy (HCC)    Diastolic heart failure (HCC)    Grade 1   Edema of both lower extremities    Food allergy    Gout    Hypercholesterolemia    Hypertension    Hypothyroidism    Kidney disease    MI (myocardial infarction) ([facility])    in his 18s   Sleep apnea    SOB (shortness of breath)     Surgical History: Past Surgical History:  Procedure Laterality Date   APPENDECTOMY     CATARACT EXTRACTION Left    COLONOSCOPY N/A 02/01/2014   Procedure: COLONOSCOPY;  Surgeon: Daneil Dolin, MD;  [facility];  Service: Endoscopy;  Laterality: N/A;  8:30   CORONARY ANGIOPLASTY WITH STENT PLACEMENT  2005   PCI- OM1 Zomax study stent 2.5 x16 mm.   LEFT HEART CATH AND CORONARY ANGIOGRAPHY N/A 03/15/2018   Procedure: LEFT HEART CATH AND CORONARY ANGIOGRAPHY;  Surgeon: Leonie Man, MD;  Location: [facility];  Service:  Cardiovascular;  Laterality: N/A;    Home Medications:  Allergies as of 12/09/2022       Reactions   Meat Extract Diarrhea, Nausea Only, Other (See Comments)   alphagal reaction   Lipitor [atorvastatin] Other (See Comments)   Muscle cramps   Simvastatin Other (See Comments)   Muscle cramps   Allopurinol Rash        Medication List        Accurate as of December 09, 2022 12:47 PM. If you have any questions, ask your nurse or doctor.          acetaminophen 500 MG tablet Commonly known as: TYLENOL Take 500 mg by mouth 2 (two) times daily as needed (for pain.).   ALPRAZolam 0.5 MG tablet Commonly known as: XANAX Take 0.5 mg by mouth daily as needed for anxiety.   aspirin EC 81 MG tablet Take 81 mg by mouth every evening.   carvedilol 25 MG tablet Commonly known as: COREG TAKE ONE AND ONE-HALF TABLET (37.5MG TOTAL) BY MOUTH TWO TIMES A DAY   cefadroxil 500 MG capsule Commonly known as: DURICEF Take 1 capsule (500 mg total) by mouth 2 (two) times daily.   diazepam 10 MG tablet Commonly known as:  VALIUM Take 10 mg by mouth daily as needed (muscle spasms).   diltiazem 30 MG tablet Commonly known as: Cardizem Take 1 tablet (30 mg total) by mouth 2 (two) times daily.   EPINEPHrine 0.3 mg/0.3 mL Soaj injection Commonly known as: EPI-PEN Inject 0.3 mg into the muscle as needed for anaphylaxis.   ezetimibe 10 MG tablet Commonly known as: ZETIA Take 10 mg by mouth at bedtime.   Febuxostat 80 MG Tabs TAKE ONE TABLET (80 MG TOTAL) BY MOUTH DAILY.   ferrous sulfate 325 (65 FE) MG tablet Take 325 mg by mouth 3 (three) times daily.   finasteride 5 MG tablet Commonly known as: PROSCAR Take 5 mg by mouth daily.   furosemide 20 MG tablet Commonly known as: LASIX Take 20 mg by mouth.   hydrALAZINE 100 MG tablet Commonly known as: APRESOLINE TAKE ONE TABLET (100MG TOTAL) BY MOUTH THREE TIMES DAILY   Jardiance 10 MG Tabs tablet Generic drug: empagliflozin Take  10 mg by mouth daily.   Kerendia 10 MG Tabs Generic drug: Finerenone Take 1 tablet by mouth daily.   levothyroxine 25 MCG tablet Commonly known as: SYNTHROID Take 25 mcg by mouth daily before breakfast. Take with 200 mg What changed: Another medication with the same name was changed. Make sure you understand how and when to take each.   levothyroxine 200 MCG tablet Commonly known as: SYNTHROID TAKE ONE TABLET BY MOUTH EVERY DAY BEFORE BREAKFAST What changed: See the new instructions.   losartan 100 MG tablet Commonly known as: COZAAR TAKE ONE TABLET (100MG TOTAL) BY MOUTH DAILY   Mitigare 0.6 MG Caps Generic drug: Colchicine Take 0.6 mg by mouth 2 (two) times daily as needed (gout flare).   omega-3 acid ethyl esters 1 g capsule Commonly known as: LOVAZA TAKE ONE CAPSULE BY MOUTH EVERY 12 HOURS   Ozempic (2 MG/DOSE) 8 MG/3ML Sopn Generic drug: Semaglutide (2 MG/DOSE) Inject into the skin.   rosuvastatin 5 MG tablet Commonly known as: CRESTOR Take 5 mg by mouth daily.   sodium bicarbonate 650 MG tablet Take 650 mg by mouth 3 (three) times daily.   tamsulosin 0.4 MG Caps capsule Commonly known as: FLOMAX TAKE ONE (1) CAPSULE BY MOUTH TWICE A DAY. (EVERY 12 HOURS.)   Vitamin D3 125 MCG (5000 UT) capsule Generic drug: Cholecalciferol Take 1 capsule (5,000 Units total) by mouth daily.        Allergies:  Allergies  Allergen Reactions   Meat Extract Diarrhea, Nausea Only and Other (See Comments)    alphagal reaction   Lipitor [Atorvastatin] Other (See Comments)    Muscle cramps   Simvastatin Other (See Comments)    Muscle cramps    Allopurinol Rash    Family History: Family History  Problem Relation Age of Onset   Colon polyps Mother    Hypertension Mother    High Cholesterol Father    Heart disease Father    COPD Father    Thyroid disease Daughter    Healthy Daughter    Healthy Other    Colon cancer Neg Hx     Social History:  reports that he  quit smoking about 18 years ago. His smoking use included cigarettes. He has a 60.00 pack-year smoking history. He has never used smokeless tobacco. He reports current alcohol use. He reports that he does not use drugs.  ROS: All other review of systems were reviewed and are negative except what is noted above in HPI  Physical Exam: BP Marland Kitchen)  186/76   Pulse 89   Constitutional:  Alert and oriented, No acute distress. HEENT: [place] AT, moist mucus membranes.  Trachea midline, no masses. Cardiovascular: No clubbing, cyanosis, or edema. Respiratory: Normal respiratory effort, no increased work of breathing. GI: Abdomen is soft, nontender, nondistended, no abdominal masses GU: No CVA tenderness. Circumcised phallus. No masses/lesions on penis, testis, scrotum. Prostate 40g smooth no nodules no induration.  Lymph: No cervical or inguinal lymphadenopathy. Skin: No rashes, bruises or suspicious lesions. Neurologic: Grossly intact, no focal deficits, moving all 4 extremities. Psychiatric: Normal mood and affect.  Laboratory Data: Lab Results  Component Value Date   WBC 10.9 (H) 11/21/2022   HGB 10.8 (L) 11/21/2022   HCT 34.1 (L) 11/21/2022   MCV 89.7 11/21/2022   PLT 182 11/21/2022    Lab Results  Component Value Date   CREATININE 2.56 (H) 11/21/2022    No results found for: "PSA"  No results found for: "TESTOSTERONE"  Lab Results  Component Value Date   HGBA1C 6.5 (H) 11/20/2022    Urinalysis    Component Value Date/Time   COLORURINE AMBER (A) 11/19/2022 2048   APPEARANCEUR CLOUDY (A) 11/19/2022 2048   APPEARANCEUR Clear 12/04/2015 1038   LABSPEC 1.020 11/19/2022 2048   PHURINE 6.0 11/19/2022 2048   GLUCOSEU 50 (A) 11/19/2022 2048   HGBUR LARGE (A) 11/19/2022 2048   BILIRUBINUR NEGATIVE 11/19/2022 2048   BILIRUBINUR Negative 12/04/2015 1038   KETONESUR NEGATIVE 11/19/2022 2048   PROTEINUR >=300 (A) 11/19/2022 2048   NITRITE NEGATIVE 11/19/2022 2048   LEUKOCYTESUR MODERATE  (A) 11/19/2022 2048    Lab Results  Component Value Date   LABMICR See below: 12/04/2015   WBCUA 0-5 12/04/2015   RBCUA 0-2 12/04/2015   LABEPIT 0-10 12/04/2015   BACTERIA NONE SEEN 11/19/2022    Pertinent Imaging:  No results found for this or any previous visit.  No results found for this or any previous visit.  No results found for this or any previous visit.  No results found for this or any previous visit.  Results for orders placed during the hospital encounter of 05/13/21  US RENAL  Narrative CLINICAL DATA:  Acute renal failure  EXAM: RENAL / URINARY TRACT ULTRASOUND COMPLETE  COMPARISON:  Ultrasound 11/05/2017  FINDINGS: Right Kidney:  Renal measurements: 15.2 x 6.8 x 7.7 cm = volume: 416.7 mL. Echogenicity within normal limits. No mass or hydronephrosis visualized.  Left Kidney:  Renal measurements: 13 x 6.8 x 5.7 cm = volume: 263.4 mL. Echogenicity within normal limits. No hydronephrosis. Small cyst in the upper pole measuring up to 15 mm.  Bladder:  Appears normal for degree of bladder distention.  Other:  None.  IMPRESSION: 1. Kidneys appear large in size bilaterally but without hydronephrosis nor significant change compared to prior ultrasound. 2. Small cysts in the left kidney   Electronically Signed By: Donavan Foil M.D. On: 05/13/2021 22:16  No valid procedures specified. No results found for this or any previous visit.  Results for orders placed during the hospital encounter of 11/19/22  CT Renal Stone Study  Narrative CLINICAL DATA:  Hematuria.  EXAM: CT ABDOMEN AND PELVIS WITHOUT CONTRAST  TECHNIQUE: Multidetector CT imaging of the abdomen and pelvis was performed following the standard protocol without IV contrast.  RADIATION DOSE REDUCTION: This exam was performed according to the departmental dose-optimization program which includes automated exposure control, adjustment of the mA and/or kV according  to patient size and/or use of iterative reconstruction technique.  COMPARISON:  None Available.  FINDINGS: Lower chest: No acute abnormality.  Hepatobiliary: No focal liver abnormality is seen. The gallbladder is moderately distended. No gallstones, gallbladder wall thickening, or biliary dilatation.  Pancreas: Punctate calcifications are seen scattered throughout the pancreatic parenchyma. No pancreatic ductal dilatation or surrounding inflammatory changes.  Spleen: A 2.1 cm diameter area of low attenuation is within an otherwise normal-appearing spleen.  Adrenals/Urinary Tract: Adrenal glands are unremarkable. Kidneys are normal in size, without renal calculi, focal lesion, or hydronephrosis. The urinary bladder is poorly distended and subsequently limited in evaluation. Moderate severity diffuse urinary bladder wall thickening is noted. Very mild hazy surrounding inflammatory fat stranding is seen.  Stomach/Bowel: Stomach is within normal limits. The appendix is not identified. No evidence of bowel wall thickening, distention, or inflammatory changes. Noninflamed diverticula are seen throughout the descending and sigmoid colon.  Vascular/Lymphatic: Aortic atherosclerosis. No enlarged abdominal or pelvic lymph nodes.  Reproductive: The prostate gland is mildly enlarged. Mild prostate gland calcification is also noted.  Other: No abdominal wall hernia or abnormality. No abdominopelvic ascites.  Musculoskeletal: There is grade 2 anterolisthesis of the L5 vertebral body on S1 with marked severity multilevel degenerative changes noted throughout the lumbar spine.  IMPRESSION: 1. Findings consistent with acute cystitis. Correlation with urinalysis is recommended. 2. Colonic diverticulosis. 3. Grade 2 anterolisthesis of the L5 vertebral body on S1 with marked severity multilevel degenerative changes throughout the lumbar spine. 4. 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 Correlation with nonemergent splenic ultrasound is recommended. 5. Aortic atherosclerosis.  Aortic Atherosclerosis (ICD10-I70.0).   Electronically Signed By: Virgina Norfolk M.D. On: 11/19/2022 23:38   Assessment & Plan:    1. Benign prostatic hyperplasia with incomplete bladder emptying -We will trial rapaflo 72m daily - Urinalysis, Routine w reflex microscopic - BLADDER SCAN AMB NON-IMAGING  2. Nocturia -we will trial rapaflo 815m If this fails to improve his LUTS he was counseled on wearing his CPAP  3. Gross hematuria -likely related to UTI  4. Elevated PSA -we will recheck PSA in 3 months   No follow-ups on file.  PaNicolette BangMD  [facility]

## 2022-12-09 NOTE — Progress Notes (Signed)
post void residual=91 

## 2022-12-25 DIAGNOSIS — Z6838 Body mass index (BMI) 38.0-38.9, adult: Secondary | ICD-10-CM | POA: Diagnosis not present

## 2022-12-25 DIAGNOSIS — R808 Other proteinuria: Secondary | ICD-10-CM | POA: Diagnosis not present

## 2022-12-25 DIAGNOSIS — D638 Anemia in other chronic diseases classified elsewhere: Secondary | ICD-10-CM | POA: Diagnosis not present

## 2022-12-25 DIAGNOSIS — N189 Chronic kidney disease, unspecified: Secondary | ICD-10-CM | POA: Diagnosis not present

## 2022-12-25 DIAGNOSIS — E1122 Type 2 diabetes mellitus with diabetic chronic kidney disease: Secondary | ICD-10-CM | POA: Diagnosis not present

## 2022-12-25 DIAGNOSIS — N041 Nephrotic syndrome with focal and segmental glomerular lesions: Secondary | ICD-10-CM | POA: Diagnosis not present

## 2022-12-25 DIAGNOSIS — N184 Chronic kidney disease, stage 4 (severe): Secondary | ICD-10-CM | POA: Diagnosis not present

## 2022-12-25 DIAGNOSIS — I5032 Chronic diastolic (congestive) heart failure: Secondary | ICD-10-CM | POA: Diagnosis not present

## 2022-12-25 DIAGNOSIS — I129 Hypertensive chronic kidney disease with stage 1 through stage 4 chronic kidney disease, or unspecified chronic kidney disease: Secondary | ICD-10-CM | POA: Diagnosis not present

## 2022-12-30 DIAGNOSIS — Z6838 Body mass index (BMI) 38.0-38.9, adult: Secondary | ICD-10-CM | POA: Diagnosis not present

## 2022-12-30 DIAGNOSIS — N041 Nephrotic syndrome with focal and segmental glomerular lesions: Secondary | ICD-10-CM | POA: Diagnosis not present

## 2022-12-30 DIAGNOSIS — R3914 Feeling of incomplete bladder emptying: Secondary | ICD-10-CM | POA: Diagnosis not present

## 2022-12-30 DIAGNOSIS — R808 Other proteinuria: Secondary | ICD-10-CM | POA: Diagnosis not present

## 2022-12-30 DIAGNOSIS — N170 Acute kidney failure with tubular necrosis: Secondary | ICD-10-CM | POA: Diagnosis not present

## 2022-12-30 DIAGNOSIS — N189 Chronic kidney disease, unspecified: Secondary | ICD-10-CM | POA: Diagnosis not present

## 2022-12-30 DIAGNOSIS — I5032 Chronic diastolic (congestive) heart failure: Secondary | ICD-10-CM | POA: Diagnosis not present

## 2022-12-30 DIAGNOSIS — E1122 Type 2 diabetes mellitus with diabetic chronic kidney disease: Secondary | ICD-10-CM | POA: Diagnosis not present

## 2022-12-30 DIAGNOSIS — I129 Hypertensive chronic kidney disease with stage 1 through stage 4 chronic kidney disease, or unspecified chronic kidney disease: Secondary | ICD-10-CM | POA: Diagnosis not present

## 2023-01-19 ENCOUNTER — Other Ambulatory Visit: Payer: Self-pay

## 2023-01-19 MED ORDER — HYDRALAZINE HCL 100 MG PO TABS: ORAL_TABLET | ORAL | 3 refills | Status: DC

## 2023-01-19 MED ORDER — DILTIAZEM HCL 30 MG PO TABS: 30.0000 mg | ORAL_TABLET | Freq: Two times a day (BID) | ORAL | 3 refills | Status: DC

## 2023-01-19 MED ORDER — CARVEDILOL 25 MG PO TABS: ORAL_TABLET | ORAL | 3 refills | Status: DC

## 2023-01-21 ENCOUNTER — Ambulatory Visit: Payer: Medicare HMO | Admitting: Urology

## 2023-03-03 DIAGNOSIS — I129 Hypertensive chronic kidney disease with stage 1 through stage 4 chronic kidney disease, or unspecified chronic kidney disease: Secondary | ICD-10-CM | POA: Diagnosis not present

## 2023-03-03 DIAGNOSIS — E8722 Chronic metabolic acidosis: Secondary | ICD-10-CM | POA: Diagnosis not present

## 2023-03-03 DIAGNOSIS — N3091 Cystitis, unspecified with hematuria: Secondary | ICD-10-CM | POA: Diagnosis not present

## 2023-03-03 DIAGNOSIS — E1122 Type 2 diabetes mellitus with diabetic chronic kidney disease: Secondary | ICD-10-CM | POA: Diagnosis not present

## 2023-03-03 DIAGNOSIS — I5032 Chronic diastolic (congestive) heart failure: Secondary | ICD-10-CM | POA: Diagnosis not present

## 2023-03-03 DIAGNOSIS — D638 Anemia in other chronic diseases classified elsewhere: Secondary | ICD-10-CM | POA: Diagnosis not present

## 2023-03-03 DIAGNOSIS — N189 Chronic kidney disease, unspecified: Secondary | ICD-10-CM | POA: Diagnosis not present

## 2023-03-03 DIAGNOSIS — N041 Nephrotic syndrome with focal and segmental glomerular lesions: Secondary | ICD-10-CM | POA: Diagnosis not present

## 2023-03-03 DIAGNOSIS — R808 Other proteinuria: Secondary | ICD-10-CM | POA: Diagnosis not present

## 2023-03-04 DIAGNOSIS — R808 Other proteinuria: Secondary | ICD-10-CM | POA: Diagnosis not present

## 2023-03-04 DIAGNOSIS — N269 Renal sclerosis, unspecified: Secondary | ICD-10-CM | POA: Diagnosis not present

## 2023-03-04 DIAGNOSIS — N2581 Secondary hyperparathyroidism of renal origin: Secondary | ICD-10-CM | POA: Diagnosis not present

## 2023-03-04 DIAGNOSIS — I129 Hypertensive chronic kidney disease with stage 1 through stage 4 chronic kidney disease, or unspecified chronic kidney disease: Secondary | ICD-10-CM | POA: Diagnosis not present

## 2023-03-09 ENCOUNTER — Ambulatory Visit: Payer: Medicare HMO | Admitting: Urology

## 2023-03-09 VITALS — BP 150/72 | HR 81

## 2023-03-09 DIAGNOSIS — R310 Gross hematuria: Secondary | ICD-10-CM

## 2023-03-09 DIAGNOSIS — R339 Retention of urine, unspecified: Secondary | ICD-10-CM | POA: Diagnosis not present

## 2023-03-09 DIAGNOSIS — N401 Enlarged prostate with lower urinary tract symptoms: Secondary | ICD-10-CM | POA: Diagnosis not present

## 2023-03-09 DIAGNOSIS — R972 Elevated prostate specific antigen [PSA]: Secondary | ICD-10-CM

## 2023-03-09 DIAGNOSIS — R350 Frequency of micturition: Secondary | ICD-10-CM

## 2023-03-09 MED ORDER — SILODOSIN 8 MG PO CAPS: 8.0000 mg | ORAL_CAPSULE | Freq: Every day | ORAL | 11 refills | Status: DC

## 2023-03-09 MED ORDER — FINASTERIDE 5 MG PO TABS: 5.0000 mg | ORAL_TABLET | Freq: Every day | ORAL | 3 refills | Status: DC

## 2023-03-09 NOTE — Progress Notes (Unsigned)
03/09/2023 10:25 AM   Brandon Warren [birth year] [id number]  Referring provider: Elfredia Nevins, MD [address] [place],  [place] [address]  Followup BPH and gross hematuria   HPI:  Brandon Warren is a 71yo here for followup for BPH and gross hematuria. Since last visit he stopped both flomax and rapaflo 8mg  daily. IPSS 11 QOL 3. He remains on finasteride. No recent gross hematuria. Urine stream is weak. He has intermittent straining to urinate. Nocturia 1-2x. No dysuria or hematuria  PMH: Past Medical History:  Diagnosis Date   Anemia    Arthritis    Back pain    BPH (benign prostatic hyperplasia)    CAD S/P percutaneous coronary angioplasty    a. 2005: PCI OM - Zomax study stent DES  2.5 x16 mm. b. cath in 02/2018 showing 20% ISR of LCx stent and 20% stenosis along mid-distal LM.    CKD (chronic kidney disease)    Controlled type 2 diabetes with neuropathy (HCC)    Diastolic heart failure (HCC)    Grade 1   Edema of both lower extremities    Food allergy    Gout    Hypercholesterolemia    Hypertension    Hypothyroidism    Kidney disease    MI (myocardial infarction) ([facility])    in his 77s   Sleep apnea    SOB (shortness of breath)     Surgical History: Past Surgical History:  Procedure Laterality Date   APPENDECTOMY     CATARACT EXTRACTION Left    COLONOSCOPY N/A 02/01/2014   Procedure: COLONOSCOPY;  Surgeon: Corbin Ade, MD;  [facility];  Service: Endoscopy;  Laterality: N/A;  8:30   CORONARY ANGIOPLASTY WITH STENT PLACEMENT  2005   PCI- OM1 Zomax study stent 2.5 x16 mm.   LEFT HEART CATH AND CORONARY ANGIOGRAPHY N/A 03/15/2018   Procedure: LEFT HEART CATH AND CORONARY ANGIOGRAPHY;  Surgeon: Marykay Lex, MD;  Location: [facility];  Service: Cardiovascular;  Laterality: N/A;    Home Medications:  Allergies as of 03/09/2023       Reactions   Meat Extract Diarrhea, Nausea Only, Other (See Comments)   alphagal reaction   Lipitor  [atorvastatin] Other (See Comments)   Muscle cramps   Simvastatin Other (See Comments)   Muscle cramps   Allopurinol Rash        Medication List        Accurate as of Mar 09, 2023 10:25 AM. If you have any questions, ask your nurse or doctor.          acetaminophen 500 MG tablet Commonly known as: TYLENOL Take 500 mg by mouth 2 (two) times daily as needed (for pain.).   ALPRAZolam 0.5 MG tablet Commonly known as: XANAX Take 0.5 mg by mouth daily as needed for anxiety.   aspirin EC 81 MG tablet Take 81 mg by mouth every evening.   carvedilol 25 MG tablet Commonly known as: COREG TAKE ONE AND ONE-HALF TABLET (37.5MG  TOTAL) BY MOUTH TWO TIMES A DAY   cefadroxil 500 MG capsule Commonly known as: DURICEF Take 1 capsule (500 mg total) by mouth 2 (two) times daily.   diazepam 10 MG tablet Commonly known as: VALIUM Take 10 mg by mouth daily as needed (muscle spasms).   diltiazem 30 MG tablet Commonly known as: Cardizem Take 1 tablet (30 mg total) by mouth 2 (two) times daily.   EPINEPHrine 0.3 mg/0.3 mL Soaj injection Commonly known as:  EPI-PEN Inject 0.3 mg into the muscle as needed for anaphylaxis.   ezetimibe 10 MG tablet Commonly known as: ZETIA Take 10 mg by mouth at bedtime.   Febuxostat 80 MG Tabs TAKE ONE TABLET (80 MG TOTAL) BY MOUTH DAILY.   ferrous sulfate 325 (65 FE) MG tablet Take 325 mg by mouth 3 (three) times daily.   finasteride 5 MG tablet Commonly known as: PROSCAR Take 5 mg by mouth daily.   Fish Oil 1000 MG Caps Take by mouth.   furosemide 20 MG tablet Commonly known as: LASIX Take 20 mg by mouth.   hydrALAZINE 100 MG tablet Commonly known as: APRESOLINE TAKE ONE TABLET (100MG  TOTAL) BY MOUTH THREE TIMES DAILY   Jardiance 10 MG Tabs tablet Generic drug: empagliflozin Take 10 mg by mouth daily.   Kerendia 10 MG Tabs Generic drug: Finerenone Take 1 tablet by mouth daily.   levothyroxine 25 MCG tablet Commonly known as:  SYNTHROID Take 25 mcg by mouth daily before breakfast. Take with 200 mg What changed: Another medication with the same name was changed. Make sure you understand how and when to take each.   levothyroxine 200 MCG tablet Commonly known as: SYNTHROID TAKE ONE TABLET BY MOUTH EVERY DAY BEFORE BREAKFAST What changed: See the new instructions.   losartan 100 MG tablet Commonly known as: COZAAR TAKE ONE TABLET (100MG  TOTAL) BY MOUTH DAILY   Mitigare 0.6 MG Caps Generic drug: Colchicine Take 0.6 mg by mouth 2 (two) times daily as needed (gout flare).   omega-3 acid ethyl esters 1 g capsule Commonly known as: LOVAZA TAKE ONE CAPSULE BY MOUTH EVERY 12 HOURS   Ozempic (2 MG/DOSE) 8 MG/3ML Sopn Generic drug: Semaglutide (2 MG/DOSE) Inject into the skin.   rosuvastatin 5 MG tablet Commonly known as: CRESTOR Take 5 mg by mouth daily.   silodosin 8 MG Caps capsule Commonly known as: RAPAFLO Take 1 capsule (8 mg total) by mouth at bedtime.   sodium bicarbonate 650 MG tablet Take 650 mg by mouth 3 (three) times daily.   tamsulosin 0.4 MG Caps capsule Commonly known as: FLOMAX TAKE ONE (1) CAPSULE BY MOUTH TWICE A DAY. (EVERY 12 HOURS.)   Vitamin D3 125 MCG (5000 UT) Caps Take 1 capsule (5,000 Units total) by mouth daily.        Allergies:  Allergies  Allergen Reactions   Meat Extract Diarrhea, Nausea Only and Other (See Comments)    alphagal reaction   Lipitor [Atorvastatin] Other (See Comments)    Muscle cramps   Simvastatin Other (See Comments)    Muscle cramps    Allopurinol Rash    Family History: Family History  Problem Relation Age of Onset   Colon polyps Mother    Hypertension Mother    High Cholesterol Father    Heart disease Father    COPD Father    Thyroid disease Daughter    Healthy Daughter    Healthy Other    Colon cancer Neg Hx     Social History:  reports that he quit smoking about 19 years ago. His smoking use included cigarettes. He has a  60.00 pack-year smoking history. He has never used smokeless tobacco. He reports current alcohol use. He reports that he does not use drugs.  ROS: All other review of systems were reviewed and are negative except what is noted above in HPI  Physical Exam: BP (!) 150/72   Pulse 81   Constitutional:  Alert and oriented, No acute distress.  HEENT: [place] AT, moist mucus membranes.  Trachea midline, no masses. Cardiovascular: No clubbing, cyanosis, or edema. Respiratory: Normal respiratory effort, no increased work of breathing. GI: Abdomen is soft, nontender, nondistended, no abdominal masses GU: No CVA tenderness.  Lymph: No cervical or inguinal lymphadenopathy. Skin: No rashes, bruises or suspicious lesions. Neurologic: Grossly intact, no focal deficits, moving all 4 extremities. Psychiatric: Normal mood and affect.  Laboratory Data: Lab Results  Component Value Date   WBC 10.9 (H) 11/21/2022   HGB 10.8 (L) 11/21/2022   HCT 34.1 (L) 11/21/2022   MCV 89.7 11/21/2022   PLT 182 11/21/2022    Lab Results  Component Value Date   CREATININE 2.56 (H) 11/21/2022    No results found for: "PSA"  No results found for: "TESTOSTERONE"  Lab Results  Component Value Date   HGBA1C 6.5 (H) 11/20/2022    Urinalysis    Component Value Date/Time   COLORURINE AMBER (A) 11/19/2022 2048   APPEARANCEUR CLOUDY (A) 11/19/2022 2048   APPEARANCEUR Clear 12/04/2015 1038   LABSPEC 1.020 11/19/2022 2048   PHURINE 6.0 11/19/2022 2048   GLUCOSEU 50 (A) 11/19/2022 2048   HGBUR LARGE (A) 11/19/2022 2048   BILIRUBINUR NEGATIVE 11/19/2022 2048   BILIRUBINUR Negative 12/04/2015 1038   KETONESUR NEGATIVE 11/19/2022 2048   PROTEINUR >=300 (A) 11/19/2022 2048   NITRITE NEGATIVE 11/19/2022 2048   LEUKOCYTESUR MODERATE (A) 11/19/2022 2048    Lab Results  Component Value Date   LABMICR See below: 12/04/2015   WBCUA 0-5 12/04/2015   RBCUA 0-2 12/04/2015   LABEPIT 0-10 12/04/2015   BACTERIA NONE SEEN  11/19/2022    Pertinent Imaging:  No results found for this or any previous visit.  No results found for this or any previous visit.  No results found for this or any previous visit.  No results found for this or any previous visit.  Results for orders placed during the hospital encounter of 05/13/21  US RENAL  Narrative CLINICAL DATA:  Acute renal failure  EXAM: RENAL / URINARY TRACT ULTRASOUND COMPLETE  COMPARISON:  Ultrasound 11/05/2017  FINDINGS: Right Kidney:  Renal measurements: 15.2 x 6.8 x 7.7 cm = volume: 416.7 mL. Echogenicity within normal limits. No mass or hydronephrosis visualized.  Left Kidney:  Renal measurements: 13 x 6.8 x 5.7 cm = volume: 263.4 mL. Echogenicity within normal limits. No hydronephrosis. Small cyst in the upper pole measuring up to 15 mm.  Bladder:  Appears normal for degree of bladder distention.  Other:  None.  IMPRESSION: 1. Kidneys appear large in size bilaterally but without hydronephrosis nor significant change compared to prior ultrasound. 2. Small cysts in the left kidney   Electronically Signed By: Jasmine Pang M.D. On: 05/13/2021 22:16  No valid procedures specified. No results found for this or any previous visit.  Results for orders placed during the hospital encounter of 11/19/22  CT Renal Stone Study  Narrative CLINICAL DATA:  Hematuria.  EXAM: CT ABDOMEN AND PELVIS WITHOUT CONTRAST  TECHNIQUE: Multidetector CT imaging of the abdomen and pelvis was performed following the standard protocol without IV contrast.  RADIATION DOSE REDUCTION: This exam was performed according to the departmental dose-optimization program which includes automated exposure control, adjustment of the mA and/or kV according to patient size and/or use of iterative reconstruction technique.  COMPARISON:  None Available.  FINDINGS: Lower chest: No acute abnormality.  Hepatobiliary: No focal liver abnormality is seen.  The gallbladder is moderately distended. No gallstones, gallbladder wall thickening, or biliary  dilatation.  Pancreas: Punctate calcifications are seen scattered throughout the pancreatic parenchyma. No pancreatic ductal dilatation or surrounding inflammatory changes.  Spleen: A 2.1 cm diameter area of low attenuation is within an otherwise normal-appearing spleen.  Adrenals/Urinary Tract: Adrenal glands are unremarkable. Kidneys are normal in size, without renal calculi, focal lesion, or hydronephrosis. The urinary bladder is poorly distended and subsequently limited in evaluation. Moderate severity diffuse urinary bladder wall thickening is noted. Very mild hazy surrounding inflammatory fat stranding is seen.  Stomach/Bowel: Stomach is within normal limits. The appendix is not identified. No evidence of bowel wall thickening, distention, or inflammatory changes. Noninflamed diverticula are seen throughout the descending and sigmoid colon.  Vascular/Lymphatic: Aortic atherosclerosis. No enlarged abdominal or pelvic lymph nodes.  Reproductive: The prostate gland is mildly enlarged. Mild prostate gland calcification is also noted.  Other: No abdominal wall hernia or abnormality. No abdominopelvic ascites.  Musculoskeletal: There is grade 2 anterolisthesis of the L5 vertebral body on S1 with marked severity multilevel degenerative changes noted throughout the lumbar spine.  IMPRESSION: 1. Findings consistent with acute cystitis. Correlation with urinalysis is recommended. 2. Colonic diverticulosis. 3. Grade 2 anterolisthesis of the L5 vertebral body on S1 with marked severity multilevel degenerative changes throughout the lumbar spine. 4. 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 Correlation with nonemergent splenic ultrasound is recommended. 5. Aortic atherosclerosis.  Aortic Atherosclerosis (ICD10-I70.0).   Electronically Signed By: Aram Candela M.D. On: 11/19/2022 23:38   Assessment & Plan:    1. Benign prostatic hyperplasia with incomplete bladder emptying -rapaflo 8mg  daily - Urinalysis, Routine w reflex microscopic  2. Urinary frequency -rapaflo 8mg  daily - Urinalysis, Routine w reflex microscopic  3. Gross hematuria -continue finasteride   No follow-ups on file.  Wilkie Aye, MD  [facility] [place]

## 2023-03-10 LAB — PSA: Prostate Specific Ag, Serum: 2.3 ng/mL (ref 0.0–4.0)

## 2023-03-12 ENCOUNTER — Encounter: Payer: Self-pay | Admitting: Urology

## 2023-03-12 NOTE — Patient Instructions (Signed)

## 2023-03-30 DIAGNOSIS — E1122 Type 2 diabetes mellitus with diabetic chronic kidney disease: Secondary | ICD-10-CM | POA: Diagnosis not present

## 2023-03-30 DIAGNOSIS — N184 Chronic kidney disease, stage 4 (severe): Secondary | ICD-10-CM | POA: Diagnosis not present

## 2023-04-01 DIAGNOSIS — E039 Hypothyroidism, unspecified: Secondary | ICD-10-CM | POA: Diagnosis not present

## 2023-04-01 DIAGNOSIS — E1140 Type 2 diabetes mellitus with diabetic neuropathy, unspecified: Secondary | ICD-10-CM | POA: Diagnosis not present

## 2023-04-01 DIAGNOSIS — E1122 Type 2 diabetes mellitus with diabetic chronic kidney disease: Secondary | ICD-10-CM | POA: Diagnosis not present

## 2023-04-01 DIAGNOSIS — Z7984 Long term (current) use of oral hypoglycemic drugs: Secondary | ICD-10-CM | POA: Diagnosis not present

## 2023-04-01 DIAGNOSIS — E221 Hyperprolactinemia: Secondary | ICD-10-CM | POA: Diagnosis not present

## 2023-04-01 DIAGNOSIS — Z7985 Long-term (current) use of injectable non-insulin antidiabetic drugs: Secondary | ICD-10-CM | POA: Diagnosis not present

## 2023-04-01 DIAGNOSIS — N184 Chronic kidney disease, stage 4 (severe): Secondary | ICD-10-CM | POA: Diagnosis not present

## 2023-04-07 NOTE — Progress Notes (Unsigned)
Cardiology Office Note    Date:  04/08/2023  ID:  Brandon Warren, DOB [birth year], MRN [id number] Cardiologist: Dina Rich, MD    History of Present Illness:    Brandon Warren is a 72 y.o. male with past medical history of CAD (s/p DES to OM in 2005, patent stent by repeat cath in 02/2018 with minimal disease elsewhere), HTN, HLD, OSA, Stage 4 CKD and palpitations (brief SVT and rare PVC's by prior monitor) who presents to the office today for 42-month follow-up.  He was last examined by Dr. Wyline Mood in 09/2022 and denied any recent chest pain or palpitations at that time. Did report some dyspnea on exertion and it was unclear if this was due to a cardiac etiology or deconditioning. A 2-day exercise nuclear stress test was reviewed but he wished to hold off at that time.  In talking with the patient and his wife today, he reports having stable dyspnea on exertion which has been occurring for over the past 4 years. He reports this worsened during the pandemic as his activity significantly decreased and he mostly stays at home. No acute change in symptoms. He does have a treadmill and is planning to start utilizing this for more routine exercise. He denies any associated chest pain or persistent palpitations. No specific orthopnea, PND or pitting edema. He does report occasional episodic dizziness with positional changes and feels like this has worsened since being started on Jardiance by Nephrology.  Studies Reviewed:   EKG: EKG is not ordered today.   LHC: 02/2018 Mid LM to Dist LM lesion is 20% stenosed. Prox Cx (Zomax Study DES STENT) is 20% stenosed. The left ventricular systolic function is normal. The left ventricular ejection fraction is 55-65% by visual estimate. LV end diastolic pressure is moderately elevated.   Widely patent circumflex stent with minimal disease elsewhere.   Moderately elevated LVEDP   Plan: Discharge home after bed rest. Anticipate  medication adjustments  with antihypertensives and possibly diuretic.   Event Monitor: 06/2020 30 day event monitor Min HR 52, Max HR 114, Avg HR 68 Reported symptoms correlate with sinus rhythm, rare PVCs No significant arrhythmias   Physical Exam:   VS:  BP 118/68   Pulse (!) 56   Ht 5\' 9"  (1.753 m)   Wt 272 lb 3.2 oz (123.5 kg)   SpO2 94%   BMI 40.20 kg/m    Wt Readings from Last 3 Encounters:  04/08/23 272 lb 3.2 oz (123.5 kg)  11/19/22 270 lb (122.5 kg)  10/07/22 268 lb 3.2 oz (121.7 kg)     GEN: Pleasant male appearing in no acute distress NECK: No JVD; No carotid bruits CARDIAC: RRR, no murmurs, rubs, gallops RESPIRATORY:  Clear to auscultation without rales, wheezing or rhonchi  ABDOMEN: Appears non-distended. No obvious abdominal masses. EXTREMITIES: No clubbing or cyanosis. No pitting edema.  Distal pedal pulses are 2+ bilaterally.   Assessment and Plan:   1. CAD - He is s/p DES to OM in 2005 and he did have a patent stent by repeat cath in 02/2018 with minimal disease elsewhere.  - He has baseline dyspnea on exertion and we reviewed today that symptoms are likely due to deconditioning as this has been stable for the past 4 years and occurred in the setting of decreased activity during the pandemic. He does have a treadmill at home and we reviewed ways to gradually increase his exercise tolerance. He wishes to hold off on a nuclear stress test  at this time and I encouraged him to make [place] aware of any progression of symptoms or if he wishes to pursue a Lexiscan Myoview as this can be arranged.  However, he would not be an ideal cardiac catheterization candidate given his Stage IV CKD and would focus on medical titration initially. - Continue ASA 81 mg daily, Coreg 37.5 mg twice daily and Crestor 5 mg daily.  2. Palpitations - Symptoms have overall been well-controlled but he does experience occasional brief episodes of tachycardia. His Kardia monitor was reviewed and this overall appears  most consistent with sinus tachycardia. There is 1 strip where it is difficult to discern P waves as this was short in duration and only lasted for a few seconds. No definitive atrial fibrillation. I encouraged him to continue to use his Kardia device and report back with any concerning strips. Will continue current medical therapy for now with Coreg 37.5 mg twice daily and short-acting Cardizem 30 mg twice daily.  3. HTN - BP is well-controlled at 118/68 during today's visit. Continue current medical therapy with Coreg 37.5 mg twice daily, Cardizem 30 mg twice daily and Hydralazine 100 mg 3 times daily.   4. HLD - Followed by his PCP.  FLP earlier this month showed total cholesterol 145, triglycerides 402, HDL 44 and LDL 42. He remains on Zetia 10 mg daily, Lovaza and Crestor 5 mg daily.  Previously intolerant to higher intensity statin therapy.  5. Stage 4 CKD - Creatinine was at 2.89 in 02/2023. He remains on Lasix 40 mg twice daily and diuretic therapy has been deferred to Nephrology. He does report episodic dizziness since being started on Jardiance and suspect he would likely need dose adjustment of his diuretic but will defer to Nephrology in the setting of his CKD.  He does have upcoming follow-up with Dr. Wolfgang Phoenix in the next few weeks.   Signed, Ellsworth Lennox, PA-C

## 2023-04-08 ENCOUNTER — Encounter: Payer: Self-pay | Admitting: Student

## 2023-04-08 ENCOUNTER — Ambulatory Visit: Payer: Medicare HMO | Attending: Student | Admitting: Student

## 2023-04-08 VITALS — BP 118/68 | HR 56 | Ht 69.0 in | Wt 272.2 lb

## 2023-04-08 DIAGNOSIS — I2510 Atherosclerotic heart disease of native coronary artery without angina pectoris: Secondary | ICD-10-CM

## 2023-04-08 DIAGNOSIS — E782 Mixed hyperlipidemia: Secondary | ICD-10-CM

## 2023-04-08 DIAGNOSIS — I10 Essential (primary) hypertension: Secondary | ICD-10-CM

## 2023-04-08 DIAGNOSIS — R002 Palpitations: Secondary | ICD-10-CM

## 2023-04-08 DIAGNOSIS — N184 Chronic kidney disease, stage 4 (severe): Secondary | ICD-10-CM | POA: Diagnosis not present

## 2023-04-08 NOTE — Patient Instructions (Signed)
Medication Instructions:  Call office if you decide to do stress test   Please monitor blood pressure and report readings to office in 3-4 weeks.   *If you need a refill on your cardiac medications before your next appointment, please call your pharmacy*   Lab Work: NONE   If you have labs (blood work) drawn today and your tests are completely normal, you will receive your results only by: MyChart Message (if you have MyChart) OR A paper copy in the mail If you have any lab test that is abnormal or we need to change your treatment, we will call you to review the results.   Testing/Procedures: NONE    Follow-Up: At [facility], you and your health needs are our priority.  As part of our continuing mission to provide you with exceptional heart care, we have created designated Provider Care Teams.  These Care Teams include your primary Cardiologist (physician) and Advanced Practice Providers (APPs -  Physician Assistants and Nurse Practitioners) who all work together to provide you with the care you need, when you need it.  We recommend signing up for the patient portal called "MyChart".  Sign up information is provided on this After Visit Summary.  MyChart is used to connect with patients for Virtual Visits (Telemedicine).  Patients are able to view lab/test results, encounter notes, upcoming appointments, etc.  Non-urgent messages can be sent to your provider as well.   To learn more about what you can do with MyChart, go to [email or web address].    Your next appointment:   6 month(s)  Provider:   Randall An, PA-C    Other Instructions Thank you for choosing [facility]!

## 2023-04-17 ENCOUNTER — Other Ambulatory Visit (HOSPITAL_COMMUNITY): Payer: Self-pay

## 2023-04-19 IMAGING — US US ABDOMEN COMPLETE
1 series · 13 of 25 positions shown · non-contrast
Comparison: CS who advancing

CLINICAL DATA: Abdominal pain.

EXAM:
ABDOMEN ULTRASOUND COMPLETE

[Series 1: us abdomen complete · 13 of 119 slices shown]
[im 1/119]
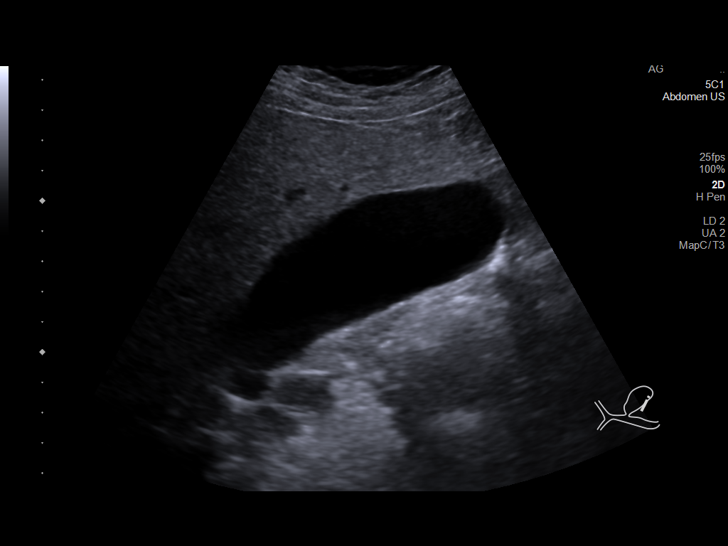
[im 10/119]
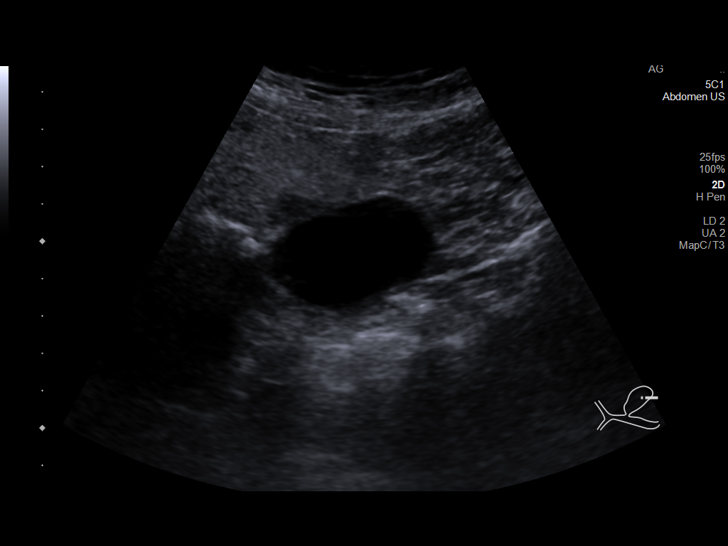
[im 20/119]
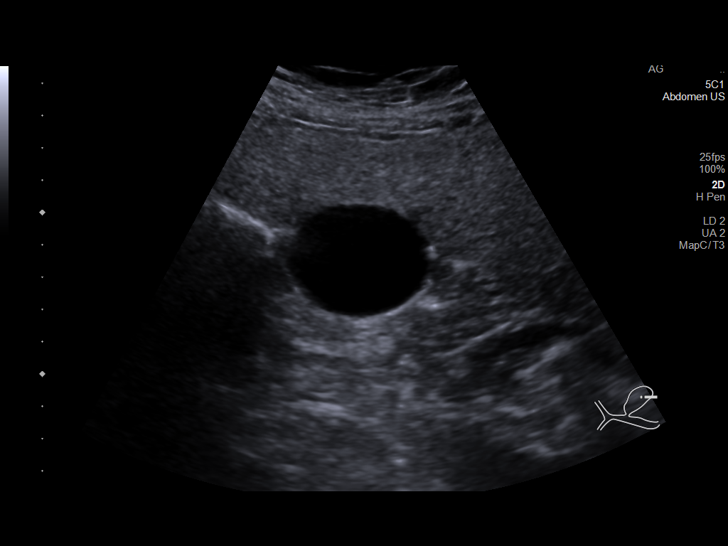
[im 30/119]
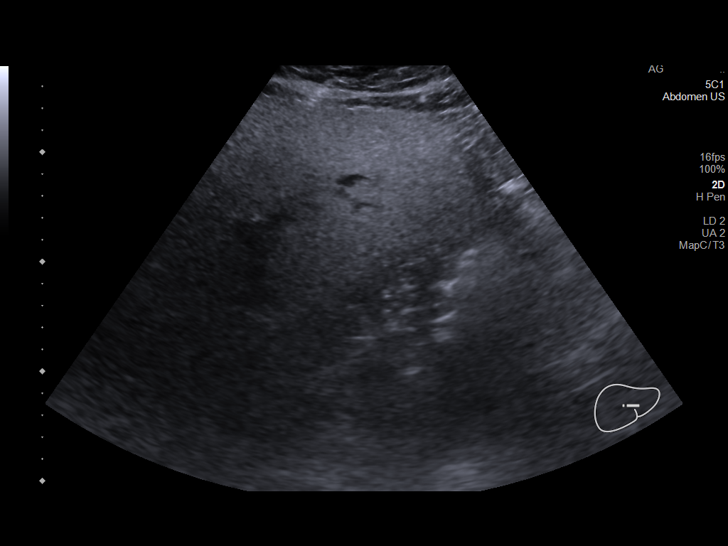
[im 40/119]
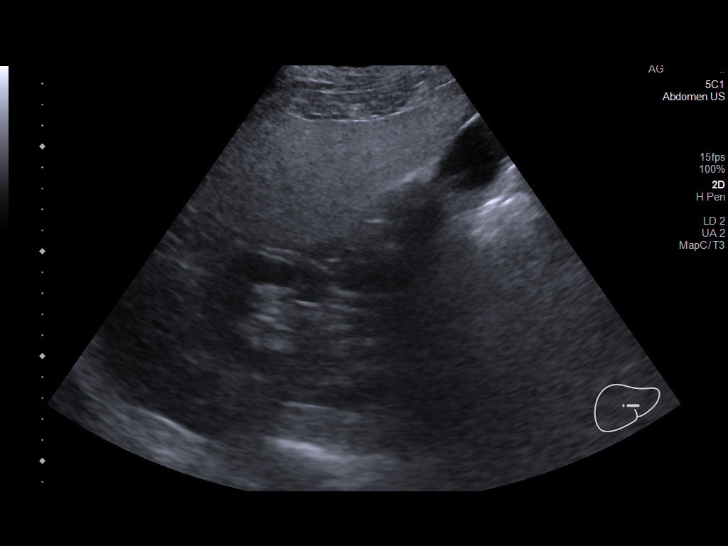
[im 50/119]
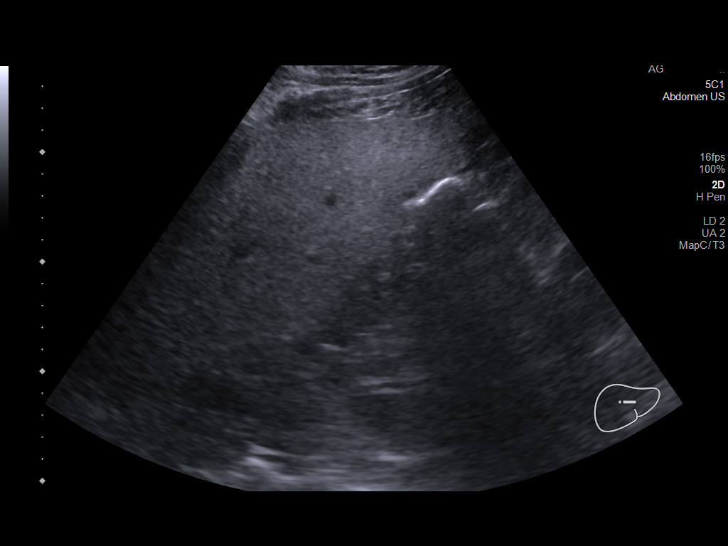
[im 60/119]
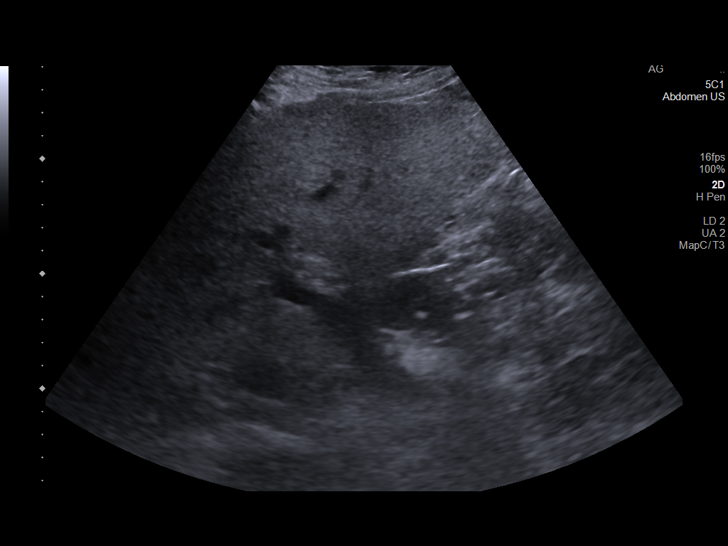
[im 69/119]
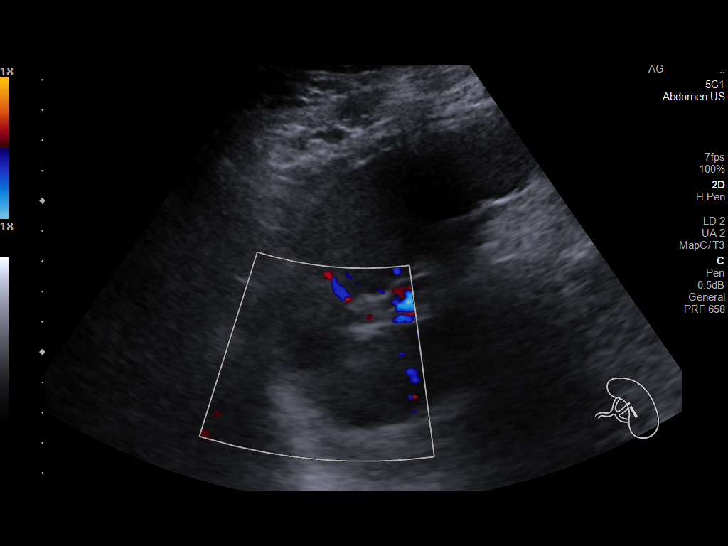
[im 79/119]
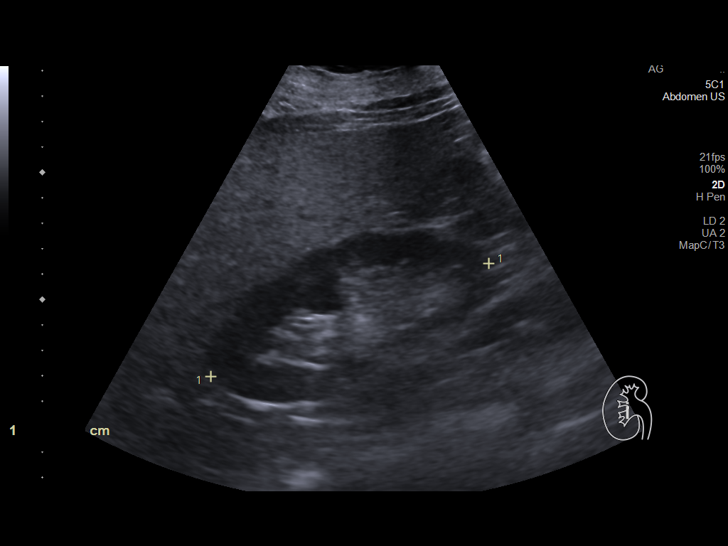
[im 89/119]
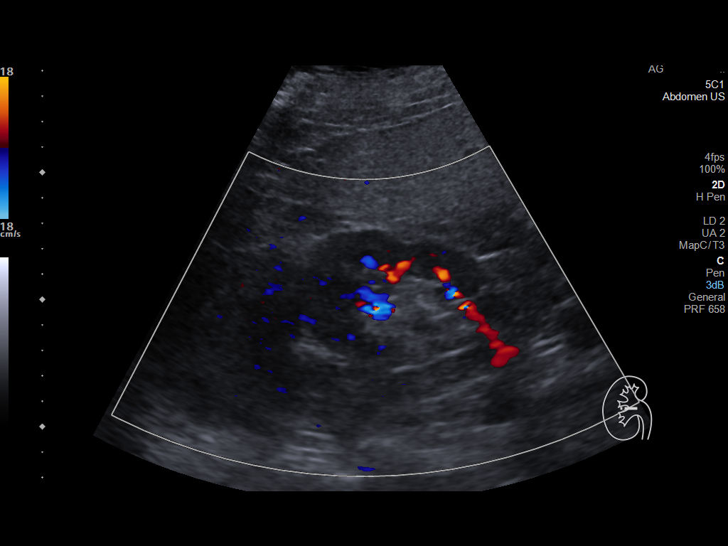
[im 99/119]
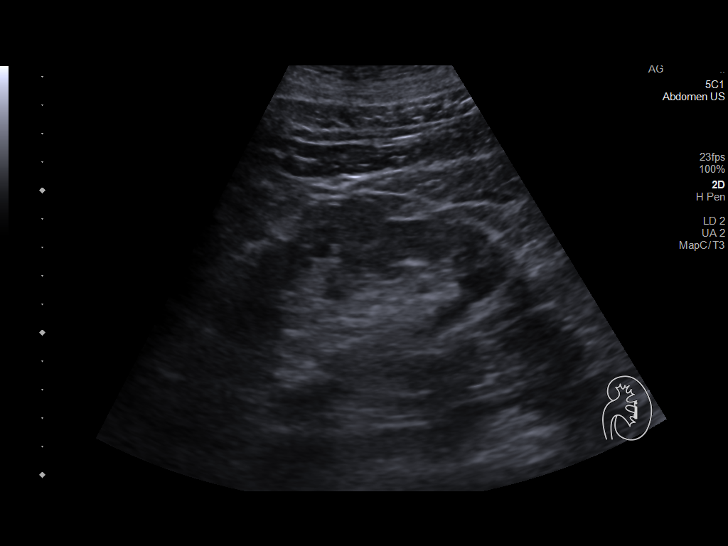
[im 109/119]
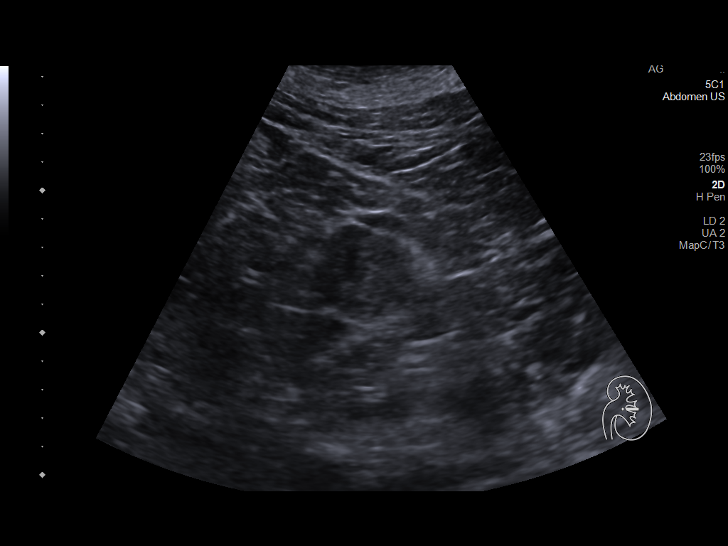
[im 119/119]
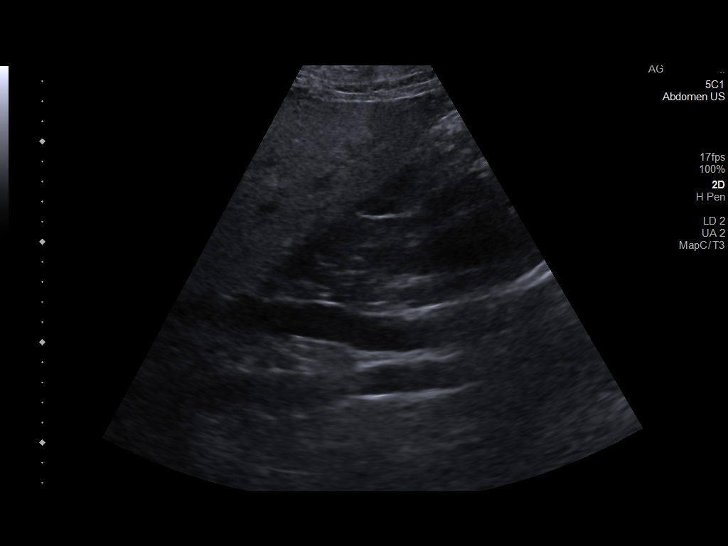

[13 of 25 positions shown; findings below may reference images not displayed]

FINDINGS: Gallbladder: No gallstones or wall thickening visualized. No
sonographic Murphy sign noted by sonographer.

Common bile duct: Diameter: 5.8 mm

Liver: No focal lesion identified. Increase in parenchymal
echogenicity. Portal vein is patent on color Doppler imaging with
normal direction of blood flow towards the liver.

IVC: No abnormality visualized.

Pancreas: Not well seen secondary to overlying bowel gas.

Spleen: Size is within normal limits measuring 10.2 cm. Cystic area
measuring 1.5 x 1.6 x 1.5 cm.

Right Kidney: Length: 11.8 cm. Echogenicity within normal limits. No
mass or hydronephrosis visualized.

Left Kidney: Length: 10.4 cm. Echogenicity within normal limits. No
mass or hydronephrosis visualized.

Abdominal aorta: Mildly dilated measuring 3.2 cm.

Other findings: Limited by body habitus.
IMPRESSION: 1. Echogenic liver likely related to diffuse fatty infiltration.
2. Abdominal aortic aneurysm measuring 3.2 cm. Recommend follow-up
ultrasound every 3 years. This recommendation follows ACR consensus
guidelines: White Paper of the ACR Incidental Findings Committee II
3. 1.6 cm splenic cyst.
4. Pancreas not well seen.

## 2023-04-20 ENCOUNTER — Other Ambulatory Visit (HOSPITAL_COMMUNITY): Payer: Self-pay

## 2023-04-20 MED ORDER — MOUNJARO 5 MG/0.5ML ~~LOC~~ SOAJ
5.0000 mg | SUBCUTANEOUS | 3 refills | Status: DC
  Filled 2023-04-20: qty 2, 28d supply, fill #0
  Filled 2023-05-21: qty 2, 28d supply, fill #1
  Filled 2023-06-17: qty 2, 28d supply, fill #2

## 2023-05-18 DIAGNOSIS — N189 Chronic kidney disease, unspecified: Secondary | ICD-10-CM | POA: Diagnosis not present

## 2023-05-18 DIAGNOSIS — I5032 Chronic diastolic (congestive) heart failure: Secondary | ICD-10-CM | POA: Diagnosis not present

## 2023-05-18 DIAGNOSIS — R808 Other proteinuria: Secondary | ICD-10-CM | POA: Diagnosis not present

## 2023-05-18 DIAGNOSIS — N269 Renal sclerosis, unspecified: Secondary | ICD-10-CM | POA: Diagnosis not present

## 2023-05-18 DIAGNOSIS — N2581 Secondary hyperparathyroidism of renal origin: Secondary | ICD-10-CM | POA: Diagnosis not present

## 2023-05-18 DIAGNOSIS — E1129 Type 2 diabetes mellitus with other diabetic kidney complication: Secondary | ICD-10-CM | POA: Diagnosis not present

## 2023-05-18 DIAGNOSIS — N184 Chronic kidney disease, stage 4 (severe): Secondary | ICD-10-CM | POA: Diagnosis not present

## 2023-05-21 ENCOUNTER — Other Ambulatory Visit: Payer: Self-pay

## 2023-05-21 ENCOUNTER — Other Ambulatory Visit (HOSPITAL_COMMUNITY): Payer: Self-pay

## 2023-05-25 ENCOUNTER — Other Ambulatory Visit (HOSPITAL_COMMUNITY): Payer: Self-pay

## 2023-05-25 MED ORDER — MOUNJARO 7.5 MG/0.5ML ~~LOC~~ SOAJ
7.5000 mg | SUBCUTANEOUS | 1 refills | Status: DC
  Filled 2023-05-25 – 2023-05-27 (×2): qty 2, 28d supply, fill #0
  Filled 2023-07-02: qty 2, 28d supply, fill #1

## 2023-05-27 ENCOUNTER — Other Ambulatory Visit (HOSPITAL_COMMUNITY): Payer: Self-pay

## 2023-05-29 DIAGNOSIS — E039 Hypothyroidism, unspecified: Secondary | ICD-10-CM | POA: Diagnosis not present

## 2023-06-05 ENCOUNTER — Other Ambulatory Visit (HOSPITAL_COMMUNITY)
Admission: RE | Admit: 2023-06-05 | Discharge: 2023-06-05 | Disposition: A | Discharge status: Home / Self Care | Payer: Medicare HMO | Source: Ambulatory Visit | Attending: Nephrology | Admitting: Nephrology

## 2023-06-05 DIAGNOSIS — R808 Other proteinuria: Secondary | ICD-10-CM | POA: Diagnosis not present

## 2023-06-05 DIAGNOSIS — N269 Renal sclerosis, unspecified: Secondary | ICD-10-CM | POA: Diagnosis not present

## 2023-06-05 DIAGNOSIS — N189 Chronic kidney disease, unspecified: Secondary | ICD-10-CM | POA: Insufficient documentation

## 2023-06-05 DIAGNOSIS — I129 Hypertensive chronic kidney disease with stage 1 through stage 4 chronic kidney disease, or unspecified chronic kidney disease: Secondary | ICD-10-CM | POA: Diagnosis not present

## 2023-06-05 DIAGNOSIS — N2581 Secondary hyperparathyroidism of renal origin: Secondary | ICD-10-CM | POA: Diagnosis not present

## 2023-06-05 LAB — RENAL FUNCTION PANEL
Albumin: 3.4 g/dL — ABNORMAL LOW (ref 3.5–5.0)
Anion gap: 13 (ref 5–15)
BUN: 59 mg/dL — ABNORMAL HIGH (ref 8–23)
CO2: 22 mmol/L (ref 22–32)
Calcium: 8.4 mg/dL — ABNORMAL LOW (ref 8.9–10.3)
Chloride: 97 mmol/L — ABNORMAL LOW (ref 98–111)
Creatinine, Ser: 3.73 mg/dL — ABNORMAL HIGH (ref 0.61–1.24)
GFR, Estimated: 16 mL/min — ABNORMAL LOW (ref >60)
Glucose, Bld: 275 mg/dL — ABNORMAL HIGH (ref 70–99)
Phosphorus: 4.1 mg/dL (ref 2.5–4.6)
Potassium: 4 mmol/L (ref 3.5–5.1)
Sodium: 132 mmol/L — ABNORMAL LOW (ref 135–145)

## 2023-06-12 DIAGNOSIS — N184 Chronic kidney disease, stage 4 (severe): Secondary | ICD-10-CM | POA: Diagnosis not present

## 2023-06-12 DIAGNOSIS — R808 Other proteinuria: Secondary | ICD-10-CM | POA: Diagnosis not present

## 2023-06-12 DIAGNOSIS — E1129 Type 2 diabetes mellitus with other diabetic kidney complication: Secondary | ICD-10-CM | POA: Diagnosis not present

## 2023-06-17 ENCOUNTER — Other Ambulatory Visit (HOSPITAL_COMMUNITY): Payer: Self-pay

## 2023-06-19 ENCOUNTER — Ambulatory Visit: Payer: Medicare HMO | Admitting: Urology

## 2023-06-30 DIAGNOSIS — N189 Chronic kidney disease, unspecified: Secondary | ICD-10-CM | POA: Diagnosis not present

## 2023-06-30 DIAGNOSIS — N269 Renal sclerosis, unspecified: Secondary | ICD-10-CM | POA: Diagnosis not present

## 2023-06-30 DIAGNOSIS — N184 Chronic kidney disease, stage 4 (severe): Secondary | ICD-10-CM | POA: Diagnosis not present

## 2023-06-30 DIAGNOSIS — R808 Other proteinuria: Secondary | ICD-10-CM | POA: Diagnosis not present

## 2023-06-30 DIAGNOSIS — I129 Hypertensive chronic kidney disease with stage 1 through stage 4 chronic kidney disease, or unspecified chronic kidney disease: Secondary | ICD-10-CM | POA: Diagnosis not present

## 2023-06-30 DIAGNOSIS — N2581 Secondary hyperparathyroidism of renal origin: Secondary | ICD-10-CM | POA: Diagnosis not present

## 2023-06-30 DIAGNOSIS — I5032 Chronic diastolic (congestive) heart failure: Secondary | ICD-10-CM | POA: Diagnosis not present

## 2023-07-02 ENCOUNTER — Other Ambulatory Visit (HOSPITAL_COMMUNITY): Payer: Self-pay

## 2023-07-02 DIAGNOSIS — R808 Other proteinuria: Secondary | ICD-10-CM | POA: Diagnosis not present

## 2023-07-02 DIAGNOSIS — I129 Hypertensive chronic kidney disease with stage 1 through stage 4 chronic kidney disease, or unspecified chronic kidney disease: Secondary | ICD-10-CM | POA: Diagnosis not present

## 2023-07-02 DIAGNOSIS — N269 Renal sclerosis, unspecified: Secondary | ICD-10-CM | POA: Diagnosis not present

## 2023-07-02 DIAGNOSIS — N2581 Secondary hyperparathyroidism of renal origin: Secondary | ICD-10-CM | POA: Diagnosis not present

## 2023-07-14 ENCOUNTER — Other Ambulatory Visit: Payer: Self-pay | Admitting: *Deleted

## 2023-07-14 DIAGNOSIS — I129 Hypertensive chronic kidney disease with stage 1 through stage 4 chronic kidney disease, or unspecified chronic kidney disease: Secondary | ICD-10-CM

## 2023-07-15 ENCOUNTER — Encounter: Payer: Self-pay | Admitting: Student

## 2023-07-15 ENCOUNTER — Ambulatory Visit: Payer: Medicare HMO | Attending: Student | Admitting: Student

## 2023-07-15 VITALS — BP 136/62 | HR 74 | Ht 70.0 in | Wt 273.2 lb

## 2023-07-15 DIAGNOSIS — N184 Chronic kidney disease, stage 4 (severe): Secondary | ICD-10-CM

## 2023-07-15 DIAGNOSIS — I2510 Atherosclerotic heart disease of native coronary artery without angina pectoris: Secondary | ICD-10-CM | POA: Diagnosis not present

## 2023-07-15 DIAGNOSIS — E782 Mixed hyperlipidemia: Secondary | ICD-10-CM

## 2023-07-15 DIAGNOSIS — I10 Essential (primary) hypertension: Secondary | ICD-10-CM | POA: Diagnosis not present

## 2023-07-15 DIAGNOSIS — R0609 Other forms of dyspnea: Secondary | ICD-10-CM | POA: Diagnosis not present

## 2023-07-15 DIAGNOSIS — R002 Palpitations: Secondary | ICD-10-CM | POA: Diagnosis not present

## 2023-07-15 NOTE — Patient Instructions (Signed)
Medication Instructions:  Your physician recommends that you continue on your current medications as directed. Please refer to the Current Medication list given to you today.  *If you need a refill on your cardiac medications before your next appointment, please call your pharmacy*   Lab Work: NONE   If you have labs (blood work) drawn today and your tests are completely normal, you will receive your results only by: MyChart Message (if you have MyChart) OR A paper copy in the mail If you have any lab test that is abnormal or we need to change your treatment, we will call you to review the results.   Testing/Procedures: Your physician has requested that you have an echocardiogram. Echocardiography is a painless test that uses sound waves to create images of your heart. It provides your doctor with information about the size and shape of your heart and how well your heart's chambers and valves are working. This procedure takes approximately one hour. There are no restrictions for this procedure. Please do NOT wear cologne, perfume, aftershave, or lotions (deodorant is allowed). Please arrive 15 minutes prior to your appointment time.    Follow-Up: At [facility], you and your health needs are our priority.  As part of our continuing mission to provide you with exceptional heart care, we have created designated Provider Care Teams.  These Care Teams include your primary Cardiologist (physician) and Advanced Practice Providers (APPs -  Physician Assistants and Nurse Practitioners) who all work together to provide you with the care you need, when you need it.  We recommend signing up for the patient portal called "MyChart".  Sign up information is provided on this After Visit Summary.  MyChart is used to connect with patients for Virtual Visits (Telemedicine).  Patients are able to view lab/test results, encounter notes, upcoming appointments, etc.  Non-urgent messages can be sent to  your provider as well.   To learn more about what you can do with MyChart, go to [email or web address].    Your next appointment:   3 -4 month(s)  Provider:   Dina Rich, MD or Randall An, PA-C    Other Instructions Thank you for choosing [facility]!

## 2023-07-15 NOTE — Progress Notes (Signed)
Cardiology Office Note    Date:  07/15/2023  ID:  Brandon Warren, DOB [birth year], MRN [id number] Cardiologist: Dina Rich, MD    History of Present Illness:    Brandon Warren is a 72 y.o. male with past medical history of CAD (s/p DES to OM in 2005, patent stent by repeat cath in 02/2018 with minimal disease elsewhere), HTN, HLD, OSA, Stage 4 CKD and palpitations (brief SVT and rare PVC's by prior monitor) who presents to the office today for 73-month follow-up.   He was examined by myself in 03/2023 and reported having stable dyspnea on exertion for several years and no acute changes in this. Denied any recent chest pain. Options of a Lexiscan Myoview were reviewed if he developed worsening symptoms but it was felt he would not be an ideal cardiac catheterization candidate given his Stage IV CKD. He was continued on ASA, Coreg, short-acting Cardizem, Crestor and Hydralazine.  In talking the patient and his wife today, he does report that his kidney function has worsened over the past few months and he is scheduled for a follow-up visit with Vascular Surgery next week to discuss access placement. He is unsure if he will pursue HD or PD. He does report a 17 pound weight gain over the past few weeks despite no changes in his dietary intake. Feels more distended along his abdomen. Says he is not active at baseline. He does report worsening dyspnea on exertion but no specific orthopnea or PND. No recent chest pain or palpitations. Says that his prior palpitations have actually improved over the past few months.  Studies Reviewed:   EKG: EKG is not ordered today.  Echocardiogram: 01/2017 Study Conclusions   - Left ventricle: The cavity size was normal. Wall thickness was    increased in a pattern of mild LVH. Systolic function was normal.    The estimated ejection fraction was in the range of 60% to 65%.    Wall motion was normal; there were no regional wall motion    abnormalities.  Doppler parameters are consistent with abnormal    left ventricular relaxation (grade 1 diastolic dysfunction).  - Aortic valve: Probably trileaflet. Mean gradient (S): 3 mm Hg.  - Tricuspid valve: There was trivial regurgitation.  - Pulmonary arteries: Systolic pressure could not be accurately    estimated.  - Pericardium, extracardiac: A prominent pericardial fat pad was    present.   Impressions:   - Mild LVH with LVEF 60-65% and grade 1 diastolic dysfunction.    Trivial tricuspid regurgitation, unable to assess PASP. Prominent    pericardial fat pad.   Cardiac Catheterization: 02/2018 Mid LM to Dist LM lesion is 20% stenosed. Prox Cx (Zomax Study DES STENT) is 20% stenosed. The left ventricular systolic function is normal. The left ventricular ejection fraction is 55-65% by visual estimate. LV end diastolic pressure is moderately elevated.   Widely patent circumflex stent with minimal disease elsewhere.   Moderately elevated LVEDP   Plan: Discharge home after bed rest. Anticipate  medication adjustments with antihypertensives and possibly diuretic.  Event Monitor: 06/2020 30 day event monitor Min HR 52, Max HR 114, Avg HR 68 Reported symptoms correlate with sinus rhythm, rare PVCs No significant arrhythmias   Physical Exam:   VS:  BP 136/62   Pulse 74   Ht 5\' 10"  (1.778 m)   Wt 273 lb 3.2 oz (123.9 kg)   SpO2 96%   BMI 39.20 kg/m    Wt Readings  from Last 3 Encounters:  07/15/23 273 lb 3.2 oz (123.9 kg)  04/08/23 272 lb 3.2 oz (123.5 kg)  11/19/22 270 lb (122.5 kg)     GEN: Well nourished, well developed male appearing in no acute distress NECK: No JVD; No carotid bruits CARDIAC: RRR, no murmurs, rubs, gallops RESPIRATORY:  Clear to auscultation without rales, wheezing or rhonchi  ABDOMEN: Appears distended. No obvious abdominal masses. EXTREMITIES: No clubbing or cyanosis. No pitting  edema.  Distal pedal pulses are 2+ bilaterally.   Assessment and Plan:    1. CAD - He underwent stenting to the OM in 2005 and most recent cardiac catheterization in 2019 showed a patent stent with minimal disease elsewhere. He reports dyspnea on exertion but no recent chest pain. Will plan for an echocardiogram as discussed below.  - Continue ASA, Coreg and Crestor at current dosing.   2. Dyspnea on Exertion/Abdominal Distension - He reports having baseline dyspnea on exertion but feels that symptoms have progressed. Also reports a 17 lb weight gain within the past few weeks and this also occurred while switching from Ozempic to [place]. Reports worsening abdominal distension.  - He is currently taking Lasix 40mg  BID and reports Jardiance was stopped by Nephrology. Will reach out to Nephrology today in regard to possible titration of Lasix as this has been deferred to them given his Stage 4 CKD. Will obtain a repeat echocardiogram given the time-frame since his last study and to assess for any structural abnormalities.   3. Palpitations - Reports symptoms have improved. Continue current medical therapy with Coreg 37.5mg  BID and short-acting Cardizem 30mg  BID.   4. HLD - His LDL was at 42 in 03/2023 but triglycerides were elevated at 402. He has been continued on Lovaza and Crestor 5 mg daily as he was previously intolerant to higher intensity statins.  5. HTN - BP is at 136/62 during today's visit. Continue current medical therapy with Coreg, short-acting Cardizem and Hydralazine.   6. Stage 4 CKD - Followed by Nephrology. Creatinine was at 3.73 when checked in 05/2023. Will reach out to Nephrology today as discussed above in regards to adjusting his diuretic regimen.   Signed, Ellsworth Lennox, PA-C

## 2023-07-19 NOTE — H&P (View-Only) (Signed)
[facility] [place]  ASSESSMENT / PLAN: Brandon Warren is a 72 y.o. right handed male in need of permanent dialysis access. I reviewed options for dialysis in detail with the patient, including hemodialysis and peritoneal dialysis. I counseled the patient to ask their nephrologist about their candidacy for renal transplant. I counseled the patient that dialysis access requires surveillance and periodic maintenance. Plan to proceed with left brachiocephalic arteriovenous fistula as schedule allows.   CHIEF COMPLAINT: CKD  HISTORY OF PRESENT ILLNESS: Brandon Warren is a 72 y.o. male who presents to clinic for evaluation of worsening renal function. He is right handed. He has never had dialysis access before. He has never had a port or pacemaker before.   Past Medical History:  Diagnosis Date   Anemia    Arthritis    Back pain    BPH (benign prostatic hyperplasia)    CAD S/P percutaneous coronary angioplasty    a. 2005: PCI OM - Zomax study stent DES  2.5 x16 mm. b. cath in 02/2018 showing 20% ISR of LCx stent and 20% stenosis along mid-distal LM.    CKD (chronic kidney disease)    Controlled type 2 diabetes with neuropathy (HCC)    Diastolic heart failure (HCC)    Grade 1   Edema of both lower extremities    Food allergy    Gout    Hypercholesterolemia    Hypertension    Hypothyroidism    Kidney disease    MI (myocardial infarction) (HCC)    in his 74s   Sleep apnea    SOB (shortness of breath)     Past Surgical History:  Procedure Laterality Date   APPENDECTOMY     CATARACT EXTRACTION Left    COLONOSCOPY N/A 02/01/2014   Procedure: COLONOSCOPY;  Surgeon: Corbin Ade, MD;  [facility];  Service: Endoscopy;  Laterality: N/A;  8:30   CORONARY ANGIOPLASTY WITH STENT PLACEMENT  2005   PCI- OM1 Zomax study stent 2.5 x16 mm.   LEFT HEART CATH AND CORONARY ANGIOGRAPHY N/A 03/15/2018   Procedure: LEFT HEART CATH AND CORONARY ANGIOGRAPHY;   Surgeon: Marykay Lex, MD;  Location: [facility] INVASIVE CV LAB;  Service: Cardiovascular;  Laterality: N/A;    Family History  Problem Relation Age of Onset   Colon polyps Mother    Hypertension Mother    High Cholesterol Father    Heart disease Father    COPD Father    Thyroid disease Daughter    Healthy Daughter    Healthy Other    Colon cancer Neg Hx     Social History   Socioeconomic History   Marital status: Married    Spouse name: Psychologist, occupational   Number of children: Not on file   Years of education: Not on file   Highest education level: Not on file  Occupational History   Occupation: Chief Strategy Officer  Tobacco Use   Smoking status: Former    Current packs/day: 0.00    Average packs/day: 2.0 packs/day for 30.0 years (60.0 ttl pk-yrs)    Types: Cigarettes    Start date: 02/01/1974    Quit date: 02/02/2004    Years since quitting: 19.4   Smokeless tobacco: Never  Vaping Use   Vaping status: Never Used  Substance and Sexual Activity   Alcohol use: Yes    Comment: occasional   Drug use: No   Sexual activity: Yes    Birth control/protection: None  Other Topics  Concern   Not on file  Social History Narrative   Not on file   Social Determinants of Health   Financial Resource Strain: Low Risk  (02/05/2022)   Received from [facility], [facility]   Overall Financial Resource Strain (CARDIA)    Difficulty of Paying Living Expenses: Not hard at all  Food Insecurity: No Food Insecurity (11/20/2022)   Hunger Vital Sign    Worried About Running Out of Food in the Last Year: Never true    Ran Out of Food in the Last Year: Never true  Transportation Needs: No Transportation Needs (11/20/2022)   PRAPARE - Administrator, Civil Service (Medical): No    Lack of Transportation (Non-Medical): No  Physical Activity: Not on file  Stress: Not on file  Social Connections: Unknown (03/11/2022)   Received from [facility]   Social Network    Social Network: Not on file  Intimate Partner Violence: Not At Risk (11/20/2022)   Humiliation, Afraid, Rape, and Kick questionnaire    Fear of Current or Ex-Partner: No    Emotionally Abused: No    Physically Abused: No    Sexually Abused: No    Allergies  Allergen Reactions   Meat Extract Diarrhea, Nausea Only and Other (See Comments)    alphagal reaction   Lipitor [Atorvastatin] Other (See Comments)    Muscle cramps   Simvastatin Other (See Comments)    Muscle cramps    Allopurinol Rash    Current Outpatient Medications  Medication Sig Dispense Refill   acetaminophen (TYLENOL) 500 MG tablet Take 500 mg by mouth 2 (two) times daily as needed (for pain.).     ALPRAZolam (XANAX) 0.5 MG tablet Take 0.5 mg by mouth daily as needed for anxiety.     aspirin EC 81 MG tablet Take 81 mg by mouth every evening.     carvedilol (COREG) 25 MG tablet TAKE ONE AND ONE-HALF TABLET (37.5MG  TOTAL) BY MOUTH TWO TIMES A DAY 270 tablet 3   Cholecalciferol (VITAMIN D3) 5000 units CAPS Take 1 capsule (5,000 Units total) by mouth daily. 90 capsule 0   diltiazem (CARDIZEM) 30 MG tablet Take 1 tablet (30 mg total) by mouth 2 (two) times daily. 180 tablet 3   EPINEPHrine 0.3 mg/0.3 mL IJ SOAJ injection Inject 0.3 mg into the muscle as needed for anaphylaxis.     Febuxostat 80 MG TABS TAKE ONE TABLET (80 MG TOTAL) BY MOUTH DAILY. 30 tablet 3   ferrous sulfate 325 (65 FE) MG tablet Take 325 mg by mouth 3 (three) times daily.     finasteride (PROSCAR) 5 MG tablet Take 1 tablet (5 mg total) by mouth daily. 90 tablet 3   furosemide (LASIX) 20 MG tablet Take 40 mg by mouth 2 (two) times daily.     hydrALAZINE (APRESOLINE) 100 MG tablet TAKE ONE TABLET (100MG  TOTAL) BY MOUTH THREE TIMES DAILY 270 tablet 3   levothyroxine (SYNTHROID) 25 MCG tablet Take 25 mcg by mouth daily before breakfast. Take with 200 mg     levothyroxine (SYNTHROID, LEVOTHROID) 200 MCG tablet TAKE ONE TABLET BY  MOUTH EVERY DAY BEFORE BREAKFAST (Patient taking differently: Take 200 mcg by mouth daily before breakfast. Take with 25 mcg to equal 225 mcg daily) 90 tablet 0   MITIGARE 0.6 MG CAPS Take 0.6 mg by mouth 2 (two) times daily as needed (gout flare).     omega-3 acid ethyl esters (LOVAZA) 1  g capsule TAKE ONE CAPSULE BY MOUTH EVERY 12 HOURS 7 capsule 0   rosuvastatin (CRESTOR) 5 MG tablet Take 5 mg by mouth daily.     silodosin (RAPAFLO) 8 MG CAPS capsule Take 1 capsule (8 mg total) by mouth at bedtime. 30 capsule 11   sodium bicarbonate 650 MG tablet Take 650 mg by mouth 3 (three) times daily.     tirzepatide (MOUNJARO) 7.5 MG/0.5ML Pen Inject 7.5 mg into the skin once a week. 6 mL 1   No current facility-administered medications for this visit.    PHYSICAL EXAM Vitals:   07/20/23 1042  BP: 138/78  Pulse: 66  Temp: 98 F (36.7 C)  TempSrc: Temporal  SpO2: 96%  Weight: 272 lb 6.4 oz (123.6 kg)  Height: 5\' 10"  (1.778 m)    Elderly obese man in no distress Regular rate and rhythm Unlabored breathing  2+ radial pulses  PERTINENT LABORATORY AND RADIOLOGIC DATA  Most recent CBC    Latest Ref Rng & Units 11/21/2022    4:01 AM 11/19/2022   10:05 PM 06/07/2021    6:25 AM  CBC  WBC 4.0 - 10.5 K/uL 10.9  22.0  12.8   Hemoglobin 13.0 - 17.0 g/dL 16.1  09.6  04.5   Hematocrit 39.0 - 52.0 % 34.1  41.2  41.8   Platelets 150 - 400 K/uL 182  261  304      Most recent CMP    Latest Ref Rng & Units 06/05/2023    9:50 AM 11/21/2022    4:01 AM 11/19/2022   10:05 PM  CMP  Glucose 70 - 99 mg/dL 409  811  914   BUN 8 - 23 mg/dL 59  35  37   Creatinine 0.61 - 1.24 mg/dL 7.82  9.56  2.13   Sodium 135 - 145 mmol/L 132  137  135   Potassium 3.5 - 5.1 mmol/L 4.0  4.0  4.5   Chloride 98 - 111 mmol/L 97  104  98   CO2 22 - 32 mmol/L 22  23  22    Calcium 8.9 - 10.3 mg/dL 8.4  8.3  9.2   Total Protein 6.5 - 8.1 g/dL   7.9   Total Bilirubin 0.3 - 1.2 mg/dL   0.8   Alkaline Phos 38 - 126 U/L   64    AST 15 - 41 U/L   20   ALT 0 - 44 U/L   23     Renal function CrCl cannot be calculated (Patient's most recent lab result is older than the maximum 21 days allowed.).  Hgb A1c MFr Bld (%)  Date Value  11/20/2022 6.5 (H)    LDL Cholesterol (Calc)  Date Value Ref Range Status  03/23/2018 94 mg/dL (calc) Final    Comment:    Reference range: <100 . Desirable range <100 mg/dL for primary prevention;   <70 mg/dL for patients with CHD or diabetic patients  with > or = 2 CHD risk factors. Marland Kitchen LDL-C is now calculated using the Martin-Hopkins  calculation, which is a validated novel method providing  better accuracy than the Friedewald equation in the  estimation of LDL-C.  Horald Pollen et al. Lenox Ahr. 0865;784(69): 2061-2068  (http://education.[employer].com/faq/FAQ164)    LDL Chol Calc (NIH)  Date Value Ref Range Status  04/16/2020 95 0 - 99 mg/dL Final    Venous duplex shows suitable superficial vein for left brachiocephalic arteriovenous fistula creation  Rande Brunt. Lenell Antu, MD FACS [facility] Phone Number: [phone] 07/20/2023 12:52 PM   Total time spent on preparing this encounter including chart review, data review, collecting history, examining the patient, coordinating care for this new patient, 60 minutes.  Portions of this report may have been transcribed using voice recognition software.  Every effort has been made to ensure accuracy; however, inadvertent computerized transcription errors may still be present.

## 2023-07-19 NOTE — Progress Notes (Unsigned)
[facility] OF [place]  ASSESSMENT / PLAN: Brandon Warren is a 72 y.o. right handed male in need of permanent dialysis access. I reviewed options for dialysis in detail with the patient, including hemodialysis and peritoneal dialysis. I counseled the patient to ask their nephrologist about their candidacy for renal transplant. I counseled the patient that dialysis access requires surveillance and periodic maintenance. Plan to proceed with left brachiocephalic arteriovenous fistula as schedule allows.   CHIEF COMPLAINT: CKD  HISTORY OF PRESENT ILLNESS: Brandon Warren is a 72 y.o. male who presents to clinic for evaluation of worsening renal function. He is right handed. He has never had dialysis access before. He has never had a port or pacemaker before.   Past Medical History:  Diagnosis Date   Anemia    Arthritis    Back pain    BPH (benign prostatic hyperplasia)    CAD S/P percutaneous coronary angioplasty    a. 2005: PCI OM - Zomax study stent DES  2.5 x16 mm. b. cath in 02/2018 showing 20% ISR of LCx stent and 20% stenosis along mid-distal LM.    CKD (chronic kidney disease)    Controlled type 2 diabetes with neuropathy (HCC)    Diastolic heart failure (HCC)    Grade 1   Edema of both lower extremities    Food allergy    Gout    Hypercholesterolemia    Hypertension    Hypothyroidism    Kidney disease    MI (myocardial infarction) (HCC)    in his 52s   Sleep apnea    SOB (shortness of breath)     Past Surgical History:  Procedure Laterality Date   APPENDECTOMY     CATARACT EXTRACTION Left    COLONOSCOPY N/A 02/01/2014   Procedure: COLONOSCOPY;  Surgeon: Corbin Ade, MD;  [facility];  Service: Endoscopy;  Laterality: N/A;  8:30   CORONARY ANGIOPLASTY WITH STENT PLACEMENT  2005   PCI- OM1 Zomax study stent 2.5 x16 mm.   LEFT HEART CATH AND CORONARY ANGIOGRAPHY N/A 03/15/2018   Procedure: LEFT HEART CATH AND CORONARY ANGIOGRAPHY;   Surgeon: Marykay Lex, MD;  Location: [facility] INVASIVE CV LAB;  Service: Cardiovascular;  Laterality: N/A;    Family History  Problem Relation Age of Onset   Colon polyps Mother    Hypertension Mother    High Cholesterol Father    Heart disease Father    COPD Father    Thyroid disease Daughter    Healthy Daughter    Healthy Other    Colon cancer Neg Hx     Social History   Socioeconomic History   Marital status: Married    Spouse name: Psychologist, occupational   Number of children: Not on file   Years of education: Not on file   Highest education level: Not on file  Occupational History   Occupation: Chief Strategy Officer  Tobacco Use   Smoking status: Former    Current packs/day: 0.00    Average packs/day: 2.0 packs/day for 30.0 years (60.0 ttl pk-yrs)    Types: Cigarettes    Start date: 02/01/1974    Quit date: 02/02/2004    Years since quitting: 19.4   Smokeless tobacco: Never  Vaping Use   Vaping status: Never Used  Substance and Sexual Activity   Alcohol use: Yes    Comment: occasional   Drug use: No   Sexual activity: Yes    Birth control/protection: None  Other Topics  Concern   Not on file  Social History Narrative   Not on file   Social Determinants of Health   Financial Resource Strain: Low Risk  (02/05/2022)   [facility]   Overall Financial Resource Strain (CARDIA)    Difficulty of Paying Living Expenses: Not hard at all  Food Insecurity: No Food Insecurity (11/20/2022)   Hunger Vital Sign    Worried About Running Out of Food in the Last Year: Never true    Ran Out of Food in the Last Year: Never true  Transportation Needs: No Transportation Needs (11/20/2022)   PRAPARE - Administrator, Civil Service (Medical): No    Lack of Transportation (Non-Medical): No  Physical Activity: Not on file  Stress: Not on file  Social Connections: Unknown (03/11/2022)   [facility]   Social Network    Social Network: Not on file  Intimate Partner Violence: Not At Risk (11/20/2022)   Humiliation, Afraid, Rape, and Kick questionnaire    Fear of Current or Ex-Partner: No    Emotionally Abused: No    Physically Abused: No    Sexually Abused: No    Allergies  Allergen Reactions   Meat Extract Diarrhea, Nausea Only and Other (See Comments)    alphagal reaction   Lipitor [Atorvastatin] Other (See Comments)    Muscle cramps   Simvastatin Other (See Comments)    Muscle cramps    Allopurinol Rash    Current Outpatient Medications  Medication Sig Dispense Refill   acetaminophen (TYLENOL) 500 MG tablet Take 500 mg by mouth 2 (two) times daily as needed (for pain.).     ALPRAZolam (XANAX) 0.5 MG tablet Take 0.5 mg by mouth daily as needed for anxiety.     aspirin EC 81 MG tablet Take 81 mg by mouth every evening.     carvedilol (COREG) 25 MG tablet TAKE ONE AND ONE-HALF TABLET (37.5MG  TOTAL) BY MOUTH TWO TIMES A DAY 270 tablet 3   Cholecalciferol (VITAMIN D3) 5000 units CAPS Take 1 capsule (5,000 Units total) by mouth daily. 90 capsule 0   diltiazem (CARDIZEM) 30 MG tablet Take 1 tablet (30 mg total) by mouth 2 (two) times daily. 180 tablet 3   EPINEPHrine 0.3 mg/0.3 mL IJ SOAJ injection Inject 0.3 mg into the muscle as needed for anaphylaxis.     Febuxostat 80 MG TABS TAKE ONE TABLET (80 MG TOTAL) BY MOUTH DAILY. 30 tablet 3   ferrous sulfate 325 (65 FE) MG tablet Take 325 mg by mouth 3 (three) times daily.     finasteride (PROSCAR) 5 MG tablet Take 1 tablet (5 mg total) by mouth daily. 90 tablet 3   furosemide (LASIX) 20 MG tablet Take 40 mg by mouth 2 (two) times daily.     hydrALAZINE (APRESOLINE) 100 MG tablet TAKE ONE TABLET (100MG  TOTAL) BY MOUTH THREE TIMES DAILY 270 tablet 3   levothyroxine (SYNTHROID) 25 MCG tablet Take 25 mcg by mouth daily before breakfast. Take with 200 mg     levothyroxine (SYNTHROID, LEVOTHROID) 200 MCG tablet TAKE ONE TABLET BY  MOUTH EVERY DAY BEFORE BREAKFAST (Patient taking differently: Take 200 mcg by mouth daily before breakfast. Take with 25 mcg to equal 225 mcg daily) 90 tablet 0   MITIGARE 0.6 MG CAPS Take 0.6 mg by mouth 2 (two) times daily as needed (gout flare).     omega-3 acid ethyl esters (LOVAZA) 1  g capsule TAKE ONE CAPSULE BY MOUTH EVERY 12 HOURS 7 capsule 0   rosuvastatin (CRESTOR) 5 MG tablet Take 5 mg by mouth daily.     silodosin (RAPAFLO) 8 MG CAPS capsule Take 1 capsule (8 mg total) by mouth at bedtime. 30 capsule 11   sodium bicarbonate 650 MG tablet Take 650 mg by mouth 3 (three) times daily.     tirzepatide (MOUNJARO) 7.5 MG/0.5ML Pen Inject 7.5 mg into the skin once a week. 6 mL 1   No current facility-administered medications for this visit.    PHYSICAL EXAM Vitals:   07/20/23 1042  BP: 138/78  Pulse: 66  Temp: 98 F (36.7 C)  TempSrc: Temporal  SpO2: 96%  Weight: 272 lb 6.4 oz (123.6 kg)  Height: 5\' 10"  (1.778 m)    Elderly obese man in no distress Regular rate and rhythm Unlabored breathing  2+ radial pulses  PERTINENT LABORATORY AND RADIOLOGIC DATA  Most recent CBC    Latest Ref Rng & Units 11/21/2022    4:01 AM 11/19/2022   10:05 PM 06/07/2021    6:25 AM  CBC  WBC 4.0 - 10.5 K/uL 10.9  22.0  12.8   Hemoglobin 13.0 - 17.0 g/dL 16.1  09.6  04.5   Hematocrit 39.0 - 52.0 % 34.1  41.2  41.8   Platelets 150 - 400 K/uL 182  261  304      Most recent CMP    Latest Ref Rng & Units 06/05/2023    9:50 AM 11/21/2022    4:01 AM 11/19/2022   10:05 PM  CMP  Glucose 70 - 99 mg/dL 409  811  914   BUN 8 - 23 mg/dL 59  35  37   Creatinine 0.61 - 1.24 mg/dL 7.82  9.56  2.13   Sodium 135 - 145 mmol/L 132  137  135   Potassium 3.5 - 5.1 mmol/L 4.0  4.0  4.5   Chloride 98 - 111 mmol/L 97  104  98   CO2 22 - 32 mmol/L 22  23  22    Calcium 8.9 - 10.3 mg/dL 8.4  8.3  9.2   Total Protein 6.5 - 8.1 g/dL   7.9   Total Bilirubin 0.3 - 1.2 mg/dL   0.8   Alkaline Phos 38 - 126 U/L   64    AST 15 - 41 U/L   20   ALT 0 - 44 U/L   23     Renal function CrCl cannot be calculated (Patient's most recent lab result is older than the maximum 21 days allowed.).  Hgb A1c MFr Bld (%)  Date Value  11/20/2022 6.5 (H)    LDL Cholesterol (Calc)  Date Value Ref Range Status  03/23/2018 94 mg/dL (calc) Final    Comment:    Reference range: <100 . Desirable range <100 mg/dL for primary prevention;   <70 mg/dL for patients with CHD or diabetic patients  with > or = 2 CHD risk factors. Marland Kitchen LDL-C is now calculated using the Martin-Hopkins  calculation, which is a validated novel method providing  better accuracy than the Friedewald equation in the  estimation of LDL-C.  Horald Pollen et al. Lenox Ahr. 0865;784(69): 2061-2068  (http://education.[facility].com/faq/FAQ164)    LDL Chol Calc (NIH)  Date Value Ref Range Status  04/16/2020 95 0 - 99 mg/dL Final    Venous duplex shows suitable superficial vein for left brachiocephalic arteriovenous fistula creation  Rande Brunt. Lenell Antu, MD FACS [facility] Phone Number: [phone] 07/20/2023 12:52 PM   Total time spent on preparing this encounter including chart review, data review, collecting history, examining the patient, coordinating care for this new patient, 60 minutes.  Portions of this report may have been transcribed using voice recognition software.  Every effort has been made to ensure accuracy; however, inadvertent computerized transcription errors may still be present.

## 2023-07-20 ENCOUNTER — Ambulatory Visit (HOSPITAL_COMMUNITY)
Admission: RE | Admit: 2023-07-20 | Discharge: 2023-07-20 | Disposition: A | Discharge status: Home / Self Care | Payer: Medicare HMO | Source: Ambulatory Visit | Attending: Surgery | Admitting: Surgery

## 2023-07-20 ENCOUNTER — Ambulatory Visit: Payer: Medicare HMO | Admitting: Vascular Surgery

## 2023-07-20 ENCOUNTER — Ambulatory Visit (INDEPENDENT_AMBULATORY_CARE_PROVIDER_SITE_OTHER)
Admission: RE | Admit: 2023-07-20 | Discharge: 2023-07-20 | Disposition: A | Discharge status: Home / Self Care | Payer: Medicare HMO | Source: Ambulatory Visit | Attending: Surgery | Admitting: Surgery

## 2023-07-20 VITALS — BP 138/78 | HR 66 | Temp 98.0°F | Ht 70.0 in | Wt 272.4 lb

## 2023-07-20 DIAGNOSIS — N184 Chronic kidney disease, stage 4 (severe): Secondary | ICD-10-CM

## 2023-07-20 DIAGNOSIS — I129 Hypertensive chronic kidney disease with stage 1 through stage 4 chronic kidney disease, or unspecified chronic kidney disease: Secondary | ICD-10-CM | POA: Insufficient documentation

## 2023-07-20 DIAGNOSIS — N185 Chronic kidney disease, stage 5: Secondary | ICD-10-CM

## 2023-07-27 ENCOUNTER — Encounter (HOSPITAL_COMMUNITY): Payer: Medicare HMO

## 2023-07-27 ENCOUNTER — Other Ambulatory Visit (HOSPITAL_COMMUNITY): Payer: Medicare HMO

## 2023-07-27 ENCOUNTER — Other Ambulatory Visit (HOSPITAL_COMMUNITY): Payer: Self-pay

## 2023-07-27 ENCOUNTER — Encounter: Payer: Medicare HMO | Admitting: Vascular Surgery

## 2023-07-27 MED ORDER — MOUNJARO 10 MG/0.5ML ~~LOC~~ SOAJ
10.0000 mg | SUBCUTANEOUS | 3 refills | Status: AC
Start: 2023-07-27 — End: ?
  Filled 2023-07-27 – 2023-07-30 (×2): qty 6, 84d supply, fill #0
  Filled 2023-11-05: qty 6, 84d supply, fill #1

## 2023-07-28 ENCOUNTER — Other Ambulatory Visit: Payer: Self-pay

## 2023-07-28 ENCOUNTER — Ambulatory Visit: Payer: Medicare HMO | Attending: Student

## 2023-07-28 DIAGNOSIS — R0609 Other forms of dyspnea: Secondary | ICD-10-CM

## 2023-07-28 DIAGNOSIS — N185 Chronic kidney disease, stage 5: Secondary | ICD-10-CM

## 2023-07-29 LAB — ECHOCARDIOGRAM COMPLETE
AR max vel: 3.09 cm2
AV Area VTI: 3.2 cm2
AV Area mean vel: 2.74 cm2
AV Mean grad: 5 mm[Hg]
AV Peak grad: 7.8 mm[Hg]
Ao pk vel: 1.4 m/s
Area-P 1/2: 2.82 cm2
Calc EF: 67.1 %
MV VTI: 2.33 cm2
S' Lateral: 3.7 cm
Single Plane A2C EF: 62.7 %
Single Plane A4C EF: 70.3 %

## 2023-07-30 ENCOUNTER — Telehealth: Payer: Self-pay | Admitting: Cardiology

## 2023-07-30 ENCOUNTER — Other Ambulatory Visit (HOSPITAL_COMMUNITY): Payer: Self-pay

## 2023-07-30 NOTE — Telephone Encounter (Signed)
Pt notified of test results

## 2023-07-30 NOTE — Telephone Encounter (Signed)
Patient returned RN's call regarding results. 

## 2023-07-31 ENCOUNTER — Other Ambulatory Visit (HOSPITAL_COMMUNITY): Payer: Self-pay

## 2023-08-03 DIAGNOSIS — J449 Chronic obstructive pulmonary disease, unspecified: Secondary | ICD-10-CM | POA: Diagnosis not present

## 2023-08-03 DIAGNOSIS — E119 Type 2 diabetes mellitus without complications: Secondary | ICD-10-CM | POA: Diagnosis not present

## 2023-08-03 DIAGNOSIS — I120 Hypertensive chronic kidney disease with stage 5 chronic kidney disease or end stage renal disease: Secondary | ICD-10-CM | POA: Diagnosis not present

## 2023-08-03 DIAGNOSIS — I2510 Atherosclerotic heart disease of native coronary artery without angina pectoris: Secondary | ICD-10-CM | POA: Diagnosis not present

## 2023-08-03 DIAGNOSIS — I10 Essential (primary) hypertension: Secondary | ICD-10-CM | POA: Diagnosis not present

## 2023-08-03 DIAGNOSIS — Z1330 Encounter for screening examination for mental health and behavioral disorders, unspecified: Secondary | ICD-10-CM | POA: Diagnosis not present

## 2023-08-03 DIAGNOSIS — N186 End stage renal disease: Secondary | ICD-10-CM | POA: Diagnosis not present

## 2023-08-03 DIAGNOSIS — G4733 Obstructive sleep apnea (adult) (pediatric): Secondary | ICD-10-CM | POA: Diagnosis not present

## 2023-08-04 DIAGNOSIS — N189 Chronic kidney disease, unspecified: Secondary | ICD-10-CM | POA: Diagnosis not present

## 2023-08-04 DIAGNOSIS — R808 Other proteinuria: Secondary | ICD-10-CM | POA: Diagnosis not present

## 2023-08-04 DIAGNOSIS — I129 Hypertensive chronic kidney disease with stage 1 through stage 4 chronic kidney disease, or unspecified chronic kidney disease: Secondary | ICD-10-CM | POA: Diagnosis not present

## 2023-08-04 DIAGNOSIS — N184 Chronic kidney disease, stage 4 (severe): Secondary | ICD-10-CM | POA: Diagnosis not present

## 2023-08-04 DIAGNOSIS — N2581 Secondary hyperparathyroidism of renal origin: Secondary | ICD-10-CM | POA: Diagnosis not present

## 2023-08-05 DIAGNOSIS — E785 Hyperlipidemia, unspecified: Secondary | ICD-10-CM | POA: Diagnosis not present

## 2023-08-05 DIAGNOSIS — N184 Chronic kidney disease, stage 4 (severe): Secondary | ICD-10-CM | POA: Diagnosis not present

## 2023-08-05 DIAGNOSIS — E221 Hyperprolactinemia: Secondary | ICD-10-CM | POA: Diagnosis not present

## 2023-08-05 DIAGNOSIS — E1122 Type 2 diabetes mellitus with diabetic chronic kidney disease: Secondary | ICD-10-CM | POA: Diagnosis not present

## 2023-08-05 DIAGNOSIS — E039 Hypothyroidism, unspecified: Secondary | ICD-10-CM | POA: Diagnosis not present

## 2023-08-05 DIAGNOSIS — E66812 Obesity, class 2: Secondary | ICD-10-CM | POA: Diagnosis not present

## 2023-08-05 DIAGNOSIS — Z6837 Body mass index (BMI) 37.0-37.9, adult: Secondary | ICD-10-CM | POA: Diagnosis not present

## 2023-08-11 ENCOUNTER — Encounter (HOSPITAL_COMMUNITY): Payer: Self-pay | Admitting: Vascular Surgery

## 2023-08-11 DIAGNOSIS — Z6839 Body mass index (BMI) 39.0-39.9, adult: Secondary | ICD-10-CM | POA: Diagnosis not present

## 2023-08-11 DIAGNOSIS — R808 Other proteinuria: Secondary | ICD-10-CM | POA: Diagnosis not present

## 2023-08-11 DIAGNOSIS — I129 Hypertensive chronic kidney disease with stage 1 through stage 4 chronic kidney disease, or unspecified chronic kidney disease: Secondary | ICD-10-CM | POA: Diagnosis not present

## 2023-08-11 DIAGNOSIS — N269 Renal sclerosis, unspecified: Secondary | ICD-10-CM | POA: Diagnosis not present

## 2023-08-11 NOTE — Progress Notes (Signed)
Anesthesia Chart Review: Same day workup  72 year old male follows with cardiology for history of CAD (s/p DES to OM in 2005, patent stent by repeat cath in 02/2018 with minimal disease elsewhere), HTN, HLD, OSA, palpitations (brief SVT and rare PVC's by prior monitor).  Last seen by Randall An, PA-C on 07/15/2023.  Per note, patient reported 17 pound weight gain over the past few weeks as well as worsening kidney function with plan to have dialysis access placed by vascular surgery in the near future.  Plan was to reach out to nephrology to discuss possible titration of Lasix due to weight gain and abdominal distention.  Repeat echocardiogram was also ordered.  Echo 07/28/2023 showed EF 65%, grade 1 DD, no significant valvular abnormalities.  Non-insulin-dependent DM2, A1c 7.5 in 03/2023.  He is on once weekly GLP-1 agonist tirzepatide, reports last dose 08/06/2023.  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  Patient will need day of surgery labs and evaluation.  EKG 11/19/22: Sinus rhythm. Rate 83. Inferior infarct, old. Consider anterior infarct. Baseline wander in lead(s) V1 V3. Accounting for baseline wander. No significant change since last tracing  TTE 07/28/2023:  1. Left ventricular ejection fraction, by estimation, is 60 to 65%. The  left ventricle has normal function. The left ventricle has no regional  wall motion abnormalities. There is mild left ventricular hypertrophy.  Left ventricular diastolic parameters  are consistent with Grade I diastolic dysfunction (impaired relaxation).  Normal LVEDP.   2. Right ventricular systolic function is normal. The right ventricular  size is normal. Tricuspid regurgitation signal is inadequate for assessing  PA pressure.   3. The mitral valve is normal in structure. Trivial mitral valve  regurgitation. No evidence of mitral stenosis.   4. The aortic valve was not well visualized. Aortic valve regurgitation  is not visualized. No aortic stenosis is present.    Comparison(s): No significant change from prior study.   Cath 03/15/2018:  Mid LM to Dist LM lesion is 20% stenosed. Prox Cx (Zomax Study DES STENT) is 20% stenosed. The left ventricular systolic function is normal. The left ventricular ejection fraction is 55-65% by visual estimate. LV end diastolic pressure is moderately elevated.   Widely patent circumflex stent with minimal disease elsewhere.   Moderately elevated LVEDP   Plan: Discharge home after bed rest. Anticipate  medication adjustments with antihypertensives and possibly diuretic.    Zannie Cove [facility] Short Stay Center/Anesthesiology Phone [phone] 08/12/2023 2:30 PM

## 2023-08-11 NOTE — Progress Notes (Signed)
SDW call  Patient was given pre-op instructions over the phone. Patient verbalized understanding of instructions provided.     PCP - Dr. Elfredia Nevins Cardiologist - Dr. Dina Rich Pulmonary:    PPM/ICD - denies Device Orders - na Rep Notified - na   Chest x-ray - na EKG -  11/21/2022 Stress Test -  ECHO - 07/29/2023 Cardiac Cath - 03/15/18  Sleep Study/sleep apnea/CPAP: Diagnosed with sleep apnea, does not use his CPAP  Type II diabetes Fasting Blood sugar range: 180's How often check sugars: Three times per week Mounjaro, states last dose 08/06/2023   Blood Thinner Instructions: denies Aspirin Instructions:continue   ERAS Protcol - NPO   COVID TEST- na    Anesthesia review: Yes. CAD, HTN, MI, DM, OSA, CKD, high chol, heart cath   Patient denies shortness of breath, fever, cough and chest pain over the phone call  Your procedure is scheduled on Thursday August 12, 2023  Report to [facility] Main Entrance "A" at  0530  A.M., then check in with the Admitting office.  Call this number if you have problems the morning of surgery:  [phone]   If you have any questions prior to your surgery date call [phone]: Open Monday-Friday 8am-4pm If you experience any cold or flu symptoms such as cough, fever, chills, shortness of breath, etc. between now and your scheduled surgery, please notify us at the above number    Remember:  Do not eat or drink after midnight the night before your surgery  Take these medicines the morning of surgery with A SIP OF WATER:  ASA, carvedilool, diltiazem, feboxostat, finasteride, hydralazine, levothyroxine, crestor  As needed: Tyllenol, xanax, mitigare  As of today, STOP taking any Aleve, Naproxen, Ibuprofen, Motrin, Advil, Goody's, BC's, all herbal medications, fish oil, and all vitamins.

## 2023-08-12 NOTE — Anesthesia Preprocedure Evaluation (Signed)
Anesthesia Evaluation  Patient identified by MRN, date of birth, ID band Patient awake    Reviewed: Allergy & Precautions, NPO status , Patient's Chart, lab work & pertinent test results  History of Anesthesia Complications Negative for: history of anesthetic complications  Airway Mallampati: IV  TM Distance: >3 FB Neck ROM: Full    Dental  (+) Chipped, Loose, Poor Dentition, Dental Advisory Given,    Pulmonary neg shortness of breath, sleep apnea , neg COPD, former smoker   breath sounds clear to auscultation       Cardiovascular hypertension, Pt. on medications and Pt. on home beta blockers (-) angina + CAD, + Past MI and + Cardiac Stents   Rhythm:Regular     Neuro/Psych negative neurological ROS  negative psych ROS   GI/Hepatic negative GI ROS, Neg liver ROS,,,  Endo/Other  diabetes, Type 2Hypothyroidism  Morbid obesity  Renal/GU CRFRenal diseaseLab Results      Component                Value               Date                      NA                       140                 08/13/2023                K                        3.9                 08/13/2023                CO2                      22                  06/05/2023                GLUCOSE                  237 (H)             08/13/2023                BUN                      47 (H)              08/13/2023                CREATININE               4.30 (H)            08/13/2023                CALCIUM                  8.4 (L)             06/05/2023                EGFR                     36 (L)  04/30/2021                GFRNONAA                 16 (L)              06/05/2023                Musculoskeletal  (+) Arthritis ,    Abdominal   Peds  Hematology  (+) Blood dyscrasia, anemia Lab Results      Component                Value               Date                      WBC                      10.9 (H)            11/21/2022                 HGB                      11.9 (L)            08/13/2023                HCT                      35.0 (L)            08/13/2023                MCV                      89.7                11/21/2022                PLT                      182                 11/21/2022              Anesthesia Other Findings   Reproductive/Obstetrics                             Anesthesia Physical Anesthesia Plan  ASA: 3  Anesthesia Plan: MAC and Regional   Post-op Pain Management: Minimal or no pain anticipated and Regional block*   Induction: Intravenous  PONV Risk Score and Plan: 1 and Ondansetron, Propofol infusion and Treatment may vary due to age or medical condition  Airway Management Planned: Nasal Cannula, Simple Face Mask and Natural Airway  Additional Equipment: None  Intra-op Plan:   Post-operative Plan:   Informed Consent: I have reviewed the patients History and Physical, chart, labs and discussed the procedure including the risks, benefits and alternatives for the proposed anesthesia with the patient or authorized representative who has indicated his/her understanding and acceptance.     Dental advisory given  Plan Discussed with: CRNA  Anesthesia Plan Comments: (PAT note by Antionette Poles, PA-C:  72 year old male follows with cardiology for history of CAD (s/p DES to OM in 2005, patent stent by repeat cath in 02/2018 with minimal  disease elsewhere), HTN, HLD, OSA, palpitations (brief SVT and rare PVC's by prior monitor).  Last seen by Randall An, PA-C on 07/15/2023.  Per note, patient reported 17 pound weight gain over the past few weeks as well as worsening kidney function with plan to have dialysis access placed by vascular surgery in the near future.  Plan was to reach out to nephrology to discuss possible titration of Lasix due to weight gain and abdominal distention.  Repeat echocardiogram was also ordered.  Echo 07/28/2023 showed EF 65%, grade 1 DD,  no significant valvular abnormalities.  Non-insulin-dependent DM2, A1c 7.5 in 03/2023.  He is on once weekly GLP-1 agonist tirzepatide, reports last dose 08/06/2023.  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  Patient will need day of surgery labs and evaluation.  EKG 11/19/22: Sinus rhythm. Rate 83. Inferior infarct, old. Consider anterior infarct. Baseline wander in lead(s) V1 V3. Accounting for baseline wander. No significant change since last tracing  TTE 07/28/2023: 1. Left ventricular ejection fraction, by estimation, is 60 to 65%. The  left ventricle has normal function. The left ventricle has no regional  wall motion abnormalities. There is mild left ventricular hypertrophy.  Left ventricular diastolic parameters  are consistent with Grade I diastolic dysfunction (impaired relaxation).  Normal LVEDP.  2. Right ventricular systolic function is normal. The right ventricular  size is normal. Tricuspid regurgitation signal is inadequate for assessing  PA pressure.  3. The mitral valve is normal in structure. Trivial mitral valve  regurgitation. No evidence of mitral stenosis.  4. The aortic valve was not well visualized. Aortic valve regurgitation  is not visualized. No aortic stenosis is present.   Comparison(s): No significant change from prior study.   Cath 03/15/2018:   Mid LM to Dist LM lesion is 20% stenosed.  Prox Cx (Zomax Study DES STENT) is 20% stenosed.  The left ventricular systolic function is normal. The left ventricular ejection fraction is 55-65% by visual estimate.  LV end diastolic pressure is moderately elevated.   Widely patent circumflex stent with minimal disease elsewhere.   Moderately elevated LVEDP  Plan: Discharge home after bed rest. Anticipate  medication adjustments with antihypertensives and possibly diuretic.  )        Anesthesia Quick Evaluation

## 2023-08-13 ENCOUNTER — Ambulatory Visit (HOSPITAL_COMMUNITY)
Admission: RE | Admit: 2023-08-13 | Discharge: 2023-08-13 | Disposition: A | Discharge status: Home / Self Care | Payer: Medicare HMO | Attending: Vascular Surgery | Admitting: Vascular Surgery

## 2023-08-13 ENCOUNTER — Other Ambulatory Visit: Payer: Self-pay

## 2023-08-13 ENCOUNTER — Ambulatory Visit (HOSPITAL_COMMUNITY): Payer: Medicare HMO | Admitting: Physician Assistant

## 2023-08-13 ENCOUNTER — Encounter (HOSPITAL_COMMUNITY)
Admission: RE | Disposition: A | Discharge status: Home / Self Care | Payer: Self-pay | Source: Home / Self Care | Attending: Vascular Surgery

## 2023-08-13 ENCOUNTER — Ambulatory Visit (HOSPITAL_BASED_OUTPATIENT_CLINIC_OR_DEPARTMENT_OTHER): Payer: Medicare HMO | Admitting: Physician Assistant

## 2023-08-13 ENCOUNTER — Encounter (HOSPITAL_COMMUNITY): Payer: Self-pay | Admitting: Vascular Surgery

## 2023-08-13 DIAGNOSIS — I5032 Chronic diastolic (congestive) heart failure: Secondary | ICD-10-CM | POA: Diagnosis not present

## 2023-08-13 DIAGNOSIS — E1140 Type 2 diabetes mellitus with diabetic neuropathy, unspecified: Secondary | ICD-10-CM | POA: Diagnosis not present

## 2023-08-13 DIAGNOSIS — I120 Hypertensive chronic kidney disease with stage 5 chronic kidney disease or end stage renal disease: Secondary | ICD-10-CM | POA: Diagnosis not present

## 2023-08-13 DIAGNOSIS — Z87891 Personal history of nicotine dependence: Secondary | ICD-10-CM | POA: Insufficient documentation

## 2023-08-13 DIAGNOSIS — Z955 Presence of coronary angioplasty implant and graft: Secondary | ICD-10-CM | POA: Diagnosis not present

## 2023-08-13 DIAGNOSIS — I252 Old myocardial infarction: Secondary | ICD-10-CM | POA: Diagnosis not present

## 2023-08-13 DIAGNOSIS — G4730 Sleep apnea, unspecified: Secondary | ICD-10-CM | POA: Diagnosis not present

## 2023-08-13 DIAGNOSIS — N186 End stage renal disease: Secondary | ICD-10-CM | POA: Diagnosis not present

## 2023-08-13 DIAGNOSIS — N400 Enlarged prostate without lower urinary tract symptoms: Secondary | ICD-10-CM | POA: Diagnosis not present

## 2023-08-13 DIAGNOSIS — E1122 Type 2 diabetes mellitus with diabetic chronic kidney disease: Secondary | ICD-10-CM | POA: Insufficient documentation

## 2023-08-13 DIAGNOSIS — Z79890 Hormone replacement therapy (postmenopausal): Secondary | ICD-10-CM | POA: Diagnosis not present

## 2023-08-13 DIAGNOSIS — E039 Hypothyroidism, unspecified: Secondary | ICD-10-CM | POA: Insufficient documentation

## 2023-08-13 DIAGNOSIS — I2510 Atherosclerotic heart disease of native coronary artery without angina pectoris: Secondary | ICD-10-CM | POA: Diagnosis not present

## 2023-08-13 DIAGNOSIS — I132 Hypertensive heart and chronic kidney disease with heart failure and with stage 5 chronic kidney disease, or end stage renal disease: Secondary | ICD-10-CM | POA: Diagnosis not present

## 2023-08-13 DIAGNOSIS — N185 Chronic kidney disease, stage 5: Secondary | ICD-10-CM

## 2023-08-13 DIAGNOSIS — Z7985 Long-term (current) use of injectable non-insulin antidiabetic drugs: Secondary | ICD-10-CM | POA: Insufficient documentation

## 2023-08-13 HISTORY — PX: AV FISTULA PLACEMENT: SHX1204

## 2023-08-13 LAB — GLUCOSE, CAPILLARY
Glucose-Capillary: 228 mg/dL — ABNORMAL HIGH (ref 70–99)
Glucose-Capillary: 193 mg/dL — ABNORMAL HIGH (ref 70–99)
Glucose-Capillary: 198 mg/dL — ABNORMAL HIGH (ref 70–99)

## 2023-08-13 LAB — POCT I-STAT, CHEM 8
BUN: 47 mg/dL — ABNORMAL HIGH (ref 8–23)
Calcium, Ion: 1.15 mmol/L (ref 1.15–1.40)
Chloride: 106 mmol/L (ref 98–111)
Creatinine, Ser: 4.3 mg/dL — ABNORMAL HIGH (ref 0.61–1.24)
Glucose, Bld: 237 mg/dL — ABNORMAL HIGH (ref 70–99)
HCT: 35 % — ABNORMAL LOW (ref 39.0–52.0)
Hemoglobin: 11.9 g/dL — ABNORMAL LOW (ref 13.0–17.0)
Potassium: 3.9 mmol/L (ref 3.5–5.1)
Sodium: 140 mmol/L (ref 135–145)
TCO2: 19 mmol/L — ABNORMAL LOW (ref 22–32)

## 2023-08-13 SURGERY — ARTERIOVENOUS (AV) FISTULA CREATION
Anesthesia: Monitor Anesthesia Care | Site: Arm Upper | Laterality: Left

## 2023-08-13 MED ORDER — DEXMEDETOMIDINE HCL IN NACL 80 MCG/20ML IV SOLN
INTRAVENOUS | Status: DC | PRN
Start: 2023-08-13 — End: 2023-08-13
  Administered 2023-08-13 (×4): 4 ug via INTRAVENOUS

## 2023-08-13 MED ORDER — OXYCODONE-ACETAMINOPHEN 5-325 MG PO TABS
1.0000 | ORAL_TABLET | Freq: Four times a day (QID) | ORAL | 0 refills | Status: DC | PRN
Start: 2023-08-13 — End: 2024-06-02

## 2023-08-13 MED ORDER — BUPIVACAINE HCL (PF) 0.5 % IJ SOLN
INTRAMUSCULAR | Status: DC | PRN
Start: 2023-08-13 — End: 2023-08-13
  Administered 2023-08-13: 5 mL via PERINEURAL

## 2023-08-13 MED ORDER — DIPHENHYDRAMINE HCL 50 MG/ML IJ SOLN
INTRAMUSCULAR | Status: DC | PRN
Start: 2023-08-13 — End: 2023-08-13
  Administered 2023-08-13: 12.5 mg via INTRAVENOUS

## 2023-08-13 MED ORDER — ACETAMINOPHEN 500 MG PO TABS: 1000.0000 mg | ORAL_TABLET | Freq: Once | ORAL | Status: DC | PRN

## 2023-08-13 MED ORDER — CHLORHEXIDINE GLUCONATE 0.12 % MT SOLN
15.0000 mL | Freq: Once | OROMUCOSAL | Status: AC
  Administered 2023-08-13: 15 mL via OROMUCOSAL
  Filled 2023-08-13: qty 15

## 2023-08-13 MED ORDER — OXYCODONE HCL 5 MG/5ML PO SOLN: 5.0000 mg | Freq: Once | ORAL | Status: DC | PRN

## 2023-08-13 MED ORDER — 0.9 % SODIUM CHLORIDE (POUR BTL) OPTIME
TOPICAL | Status: DC | PRN
  Administered 2023-08-13: 1000 mL

## 2023-08-13 MED ORDER — CHLORHEXIDINE GLUCONATE 4 % EX SOLN: 60.0000 mL | Freq: Once | CUTANEOUS | Status: DC

## 2023-08-13 MED ORDER — MIDAZOLAM HCL 2 MG/2ML IJ SOLN
INTRAMUSCULAR | Status: DC | PRN
  Administered 2023-08-13 (×2): 1 mg via INTRAVENOUS

## 2023-08-13 MED ORDER — OXYCODONE HCL 5 MG PO TABS: 5.0000 mg | ORAL_TABLET | Freq: Once | ORAL | Status: DC | PRN

## 2023-08-13 MED ORDER — ACETAMINOPHEN 160 MG/5ML PO SOLN: 1000.0000 mg | Freq: Once | ORAL | Status: DC | PRN

## 2023-08-13 MED ORDER — LIDOCAINE HCL (PF) 1 % IJ SOLN
INTRAMUSCULAR | Status: AC
  Filled 2023-08-13: qty 30

## 2023-08-13 MED ORDER — INSULIN ASPART 100 UNIT/ML IJ SOLN
INTRAMUSCULAR | Status: AC
  Filled 2023-08-13: qty 1

## 2023-08-13 MED ORDER — SODIUM CHLORIDE 0.9 % IV SOLN: INTRAVENOUS | Status: DC | PRN

## 2023-08-13 MED ORDER — LIDOCAINE-EPINEPHRINE (PF) 1.5 %-1:200000 IJ SOLN
INTRAMUSCULAR | Status: DC | PRN
Start: 2023-08-13 — End: 2023-08-13
  Administered 2023-08-13: 15 mL via PERINEURAL

## 2023-08-13 MED ORDER — INSULIN ASPART 100 UNIT/ML IJ SOLN
0.0000 [IU] | INTRAMUSCULAR | Status: DC | PRN
  Administered 2023-08-13: 3 [IU] via SUBCUTANEOUS

## 2023-08-13 MED ORDER — FENTANYL CITRATE (PF) 100 MCG/2ML IJ SOLN: 25.0000 ug | INTRAMUSCULAR | Status: DC | PRN

## 2023-08-13 MED ORDER — ONDANSETRON HCL 4 MG/2ML IJ SOLN
INTRAMUSCULAR | Status: DC | PRN
Start: 2023-08-13 — End: 2023-08-13
  Administered 2023-08-13: 4 mg via INTRAVENOUS

## 2023-08-13 MED ORDER — LIDOCAINE-EPINEPHRINE (PF) 1 %-1:200000 IJ SOLN
INTRAMUSCULAR | Status: AC
  Filled 2023-08-13: qty 30

## 2023-08-13 MED ORDER — PROPOFOL 500 MG/50ML IV EMUL
INTRAVENOUS | Status: DC | PRN
  Administered 2023-08-13: 50 ug/kg/min via INTRAVENOUS

## 2023-08-13 MED ORDER — SODIUM CHLORIDE 0.9 % IV SOLN: INTRAVENOUS | Status: DC

## 2023-08-13 MED ORDER — LIDOCAINE 2% (20 MG/ML) 5 ML SYRINGE
INTRAMUSCULAR | Status: DC | PRN
  Administered 2023-08-13: 20 mg via INTRAVENOUS

## 2023-08-13 MED ORDER — FENTANYL CITRATE (PF) 250 MCG/5ML IJ SOLN
INTRAMUSCULAR | Status: DC | PRN
  Administered 2023-08-13: 50 ug via INTRAVENOUS

## 2023-08-13 MED ORDER — MEPIVACAINE HCL (PF) 2 % IJ SOLN
INTRAMUSCULAR | Status: DC | PRN
Start: 2023-08-13 — End: 2023-08-13
  Administered 2023-08-13: 10 mL

## 2023-08-13 MED ORDER — PROPOFOL 10 MG/ML IV BOLUS
INTRAVENOUS | Status: DC | PRN
  Administered 2023-08-13: 10 mg via INTRAVENOUS

## 2023-08-13 MED ORDER — PROPOFOL 10 MG/ML IV BOLUS
INTRAVENOUS | Status: AC
  Filled 2023-08-13: qty 20

## 2023-08-13 MED ORDER — CEFAZOLIN IN SODIUM CHLORIDE 3-0.9 GM/100ML-% IV SOLN
3.0000 g | INTRAVENOUS | Status: DC
  Filled 2023-08-13: qty 100

## 2023-08-13 MED ORDER — FENTANYL CITRATE (PF) 250 MCG/5ML IJ SOLN
INTRAMUSCULAR | Status: AC
  Filled 2023-08-13: qty 5

## 2023-08-13 MED ORDER — ACETAMINOPHEN 10 MG/ML IV SOLN: 1000.0000 mg | Freq: Once | INTRAVENOUS | Status: DC | PRN

## 2023-08-13 MED ORDER — ORAL CARE MOUTH RINSE: 15.0000 mL | Freq: Once | OROMUCOSAL | Status: AC

## 2023-08-13 MED ORDER — MIDAZOLAM HCL 2 MG/2ML IJ SOLN
INTRAMUSCULAR | Status: AC
  Filled 2023-08-13: qty 2

## 2023-08-13 SURGICAL SUPPLY — 38 items
APL PRP STRL LF DISP 70% ISPRP (MISCELLANEOUS) ×1
APL SKNCLS STERI-STRIP NONHPOA (GAUZE/BANDAGES/DRESSINGS) ×1
ARMBAND PINK RESTRICT EXTREMIT (MISCELLANEOUS) ×1 IMPLANT
BENZOIN TINCTURE PRP APPL 2/3 (GAUZE/BANDAGES/DRESSINGS) ×1 IMPLANT
CANISTER SUCT 3000ML PPV (MISCELLANEOUS) ×1 IMPLANT
CANNULA VESSEL 3MM 2 BLNT TIP (CANNULA) ×1 IMPLANT
CHLORAPREP W/TINT 26 (MISCELLANEOUS) ×1 IMPLANT
CLIP LIGATING EXTRA MED SLVR (CLIP) ×1 IMPLANT
CLIP LIGATING EXTRA SM BLUE (MISCELLANEOUS) ×1 IMPLANT
COVER PROBE W GEL 5X96 (DRAPES) IMPLANT
ELECT REM PT RETURN 9FT ADLT (ELECTROSURGICAL) ×1 IMPLANT
ELECTRODE REM PT RTRN 9FT ADLT (ELECTROSURGICAL) ×1 IMPLANT
GLOVE BIO SURGEON STRL SZ8 (GLOVE) ×1 IMPLANT
GOWN STRL REUS W/ TWL LRG LVL3 (GOWN DISPOSABLE) ×2 IMPLANT
GOWN STRL REUS W/ TWL XL LVL3 (GOWN DISPOSABLE) ×1 IMPLANT
GOWN STRL REUS W/TWL LRG LVL3 (GOWN DISPOSABLE) ×2
GOWN STRL REUS W/TWL XL LVL3 (GOWN DISPOSABLE) ×1
INSERT FOGARTY SM (MISCELLANEOUS) IMPLANT
KIT BASIN OR (CUSTOM PROCEDURE TRAY) ×1 IMPLANT
KIT TURNOVER KIT B (KITS) ×1 IMPLANT
LOOP VASCULAR MINI 18 RED (MISCELLANEOUS) ×1
NDL 18GX1X1/2 (RX/OR ONLY) (NEEDLE) IMPLANT
NEEDLE 18GX1X1/2 (RX/OR ONLY) (NEEDLE) IMPLANT
NS IRRIG 1000ML POUR BTL (IV SOLUTION) ×1 IMPLANT
PACK CV ACCESS (CUSTOM PROCEDURE TRAY) ×1 IMPLANT
PAD ARMBOARD 7.5X6 YLW CONV (MISCELLANEOUS) ×2 IMPLANT
SLING ARM FOAM STRAP LRG (SOFTGOODS) IMPLANT
SLING ARM FOAM STRAP MED (SOFTGOODS) IMPLANT
STRIP CLOSURE SKIN 1/2X4 (GAUZE/BANDAGES/DRESSINGS) ×1 IMPLANT
SUT MNCRL AB 4-0 PS2 18 (SUTURE) ×1 IMPLANT
SUT PROLENE 6 0 BV (SUTURE) ×1 IMPLANT
SUT VIC AB 3-0 SH 27 (SUTURE) ×1
SUT VIC AB 3-0 SH 27X BRD (SUTURE) ×1 IMPLANT
SYR 3ML LL SCALE MARK (SYRINGE) IMPLANT
TOWEL GREEN STERILE (TOWEL DISPOSABLE) ×1 IMPLANT
UNDERPAD 30X36 HEAVY ABSORB (UNDERPADS AND DIAPERS) ×1 IMPLANT
VASCULAR TIE MINI RED 18IN STL (MISCELLANEOUS) IMPLANT
WATER STERILE IRR 1000ML POUR (IV SOLUTION) ×1 IMPLANT

## 2023-08-13 NOTE — Anesthesia Postprocedure Evaluation (Signed)
Anesthesia Post Note  Patient: Brandon Warren  Procedure(s) Performed: LEFT ARM BARCHIOCEPHALIC ARTERIOVENOUS (AV) FISTULA CREATION (Left: Arm Upper)     Patient location during evaluation: PACU Anesthesia Type: Regional Level of consciousness: awake and alert Pain management: pain level controlled Vital Signs Assessment: post-procedure vital signs reviewed and stable Respiratory status: spontaneous breathing, nonlabored ventilation and respiratory function stable Cardiovascular status: stable and blood pressure returned to baseline Postop Assessment: no apparent nausea or vomiting Anesthetic complications: no   No notable events documented.  Last Vitals:  Vitals:   08/13/23 0915 08/13/23 0930  BP: 120/60 129/62  Pulse: (!) 55 (!) 52  Resp: 17 20  Temp:  36.7 C  SpO2: 94% 94%    Last Pain:  Vitals:   08/13/23 0930  TempSrc:   PainSc: 0-No pain                 Cloyde Oregel

## 2023-08-13 NOTE — Discharge Instructions (Signed)
[facility]  Discharge Instructions  AV Fistula or Graft Surgery for Dialysis Access  Please refer to the following instructions for your post-procedure care. Your surgeon or physician assistant will discuss any changes with you.  Activity  You may drive the day following your surgery, if you are comfortable and no longer taking prescription pain medication. Resume full activity as the soreness in your incision resolves.  Bathing/Showering  You may shower after you go home. Keep your incision dry for 48 hours. Do not soak in a bathtub, hot tub, or swim until the incision heals completely. You may not shower if you have a hemodialysis catheter.  Incision Care  Clean your incision with mild soap and water after 48 hours. Pat the area dry with a clean towel. You do not need a bandage unless otherwise instructed. Do not apply any ointments or creams to your incision. You may have skin glue on your incision. Do not peel it off. It will come off on its own in about one week. Your arm may swell a bit after surgery. To reduce swelling use pillows to elevate your arm so it is above your heart. Your doctor will tell you if you need to lightly wrap your arm with an ACE bandage.  Diet  Resume your normal diet. There are not special food restrictions following this procedure. In order to heal from your surgery, it is CRITICAL to get adequate nutrition. Your body requires vitamins, minerals, and protein. Vegetables are the best source of vitamins and minerals. Vegetables also provide the perfect balance of protein. Processed food has little nutritional value, so try to avoid this.  Medications  Resume taking all of your medications. If your incision is causing pain, you may take over-the counter pain relievers such as acetaminophen (Tylenol). If you were prescribed a stronger pain medication, please be aware these medications can cause nausea and constipation. Prevent  nausea by taking the medication with a snack or meal. Avoid constipation by drinking plenty of fluids and eating foods with high amount of fiber, such as fruits, vegetables, and grains.  Do not take Tylenol if you are taking prescription pain medications.  Follow up Your surgeon may want to see you in the office following your access surgery. If so, this will be arranged at the time of your surgery.  Please call us immediately for any of the following conditions:  Increased pain, redness, drainage (pus) from your incision site Fever of 101 degrees or higher Severe or worsening pain at your incision site Hand pain or numbness.  Reduce your risk of vascular disease:  Stop smoking. If you would like help, call [facility] at 1-800-QUIT-NOW ((240) 430-1326) or [place] at (216) 660-7031  Manage your cholesterol Maintain a desired weight Control your diabetes Keep your blood pressure down  Dialysis  It will take several weeks to several months for your new dialysis access to be ready for use. Your surgeon will determine when it is okay to use it. Your nephrologist will continue to direct your dialysis. You can continue to use your Permcath until your new access is ready for use.   08/13/2023 Brandon Warren [id number] 07-17-1951  Surgeon(s): Leonie Douglas, MD  Procedure(s): Creation left brachiocephalic AV fistula  x Do not stick fistula for 12 weeks    If you have any questions, please call the office at [phone].

## 2023-08-13 NOTE — Anesthesia Procedure Notes (Addendum)
Anesthesia Regional Block: Axillary brachial plexus block   Pre-Anesthetic Checklist: , timeout performed,  Correct Patient, Correct Site, Correct Laterality,  Correct Procedure, Correct Position, site marked,  Risks and benefits discussed,  Surgical consent,  Pre-op evaluation,  At surgeon's request and post-op pain management  Laterality: Left and Upper  Prep: chloraprep       Needles:  Injection technique: Single-shot      Needle Length: 9cm  Needle Gauge: 22     Additional Needles: Arrow StimuQuik ECHO Echogenic Stimulating PNB Needle  Procedures:,,,, ultrasound used (permanent image in chart),,    Narrative:  Start time: 08/13/2023 7:25 AM End time: 08/13/2023 7:35 AM Injection made incrementally with aspirations every 5 mL.  Performed by: Personally  Anesthesiologist: Val Eagle, MD  Additional Notes: Started procedure as left supraclavicular nerve block. US anatomy difficult despite adjustment of position. Able to visualize needle tip and spread adjacent to subclavian artery. !0 ml of (15ml 1.5% Lido with 1:200k epi and 10ml 2% mepivicaine) injected. Given poor images transitioned to axillary given better imaging and plan to complete patient under regional

## 2023-08-13 NOTE — Interval H&P Note (Signed)
History and Physical Interval Note:  08/13/2023 7:35 AM  Brandon Warren  has presented today for surgery, with the diagnosis of Chronic Kidney Disease Stage V.  The various methods of treatment have been discussed with the patient and family. After consideration of risks, benefits and other options for treatment, the patient has consented to  Procedure(s): LEFT ARM ARTERIOVENOUS (AV) FISTULA CREATION (Left) as a surgical intervention.  The patient's history has been reviewed, patient examined, no change in status, stable for surgery.  I have reviewed the patient's chart and labs.  Questions were answered to the patient's satisfaction.     Leonie Douglas

## 2023-08-13 NOTE — Transfer of Care (Signed)
Immediate Anesthesia Transfer of Care Note  Patient: Brandon Warren  Procedure(s) Performed: LEFT ARM BARCHIOCEPHALIC ARTERIOVENOUS (AV) FISTULA CREATION (Left: Arm Upper)  Patient Location: PACU  Anesthesia Type:MAC  Level of Consciousness: awake, alert , and oriented  Airway & Oxygen Therapy: Patient Spontanous Breathing  Post-op Assessment: Report given to RN and Post -op Vital signs reviewed and stable  Post vital signs: Reviewed and stable  Last Vitals:  Vitals Value Taken Time  BP 120/60 08/13/23 0915  Temp 36.7 C 08/13/23 0908  Pulse 53 08/13/23 0917  Resp 18 08/13/23 0917  SpO2 94 % 08/13/23 0917  Vitals shown include unfiled device data.  Last Pain:  Vitals:   08/13/23 0908  TempSrc:   PainSc: 0-No pain         Complications: No notable events documented.

## 2023-08-13 NOTE — Op Note (Signed)
DATE OF SERVICE: 08/13/2023  PATIENT:  Brandon Warren  72 y.o. male  PRE-OPERATIVE DIAGNOSIS:  CKD 5  POST-OPERATIVE DIAGNOSIS:  Same  PROCEDURE:   Left brachiocephalic arteriovenous fistula creation  SURGEON:  Surgeons and Role:    * Leonie Douglas, MD - Primary  ASSISTANT: Stann Mainland, PA-C  An experienced assistant was required given the complexity of this procedure and the standard of surgical care. My assistant helped with exposure through counter tension, suctioning, ligation and retraction to better visualize the surgical field.  My assistant expedited sewing during the case by following my sutures. Wherever I use the term "we" in the report, my assistant actively helped me with that portion of the procedure.  ANESTHESIA:   regional and MAC  EBL: minimal  BLOOD ADMINISTERED:none  DRAINS: none   LOCAL MEDICATIONS USED:  NONE  SPECIMEN:  none  COUNTS: confirmed correct.  TOURNIQUET:  none  PATIENT DISPOSITION:  PACU - hemodynamically stable.   Delay start of Pharmacological VTE agent (>24hrs) due to surgical blood loss or risk of bleeding: no  INDICATION FOR PROCEDURE: SOCRATES CAHOON is a 72 y.o. male with CKD 5. After careful discussion of risks, benefits, and alternatives the patient was offered left brachiocephalic fistula creation. The patient understood and wished to proceed.  OPERATIVE FINDINGS: healthy cephalic vein. Healthy brachial artery. Good doppler bruit at completion. Palpable radial pulse at completion.  DESCRIPTION OF PROCEDURE: After identification of the patient in the pre-operative holding area, the patient was transferred to the operating room. The patient was positioned supine on the operating room table. Anesthesia was induced. The left arm was prepped and draped in standard fashion. A surgical pause was performed confirming correct patient, procedure, and operative location.  Using intraoperative ultrasound, the course of the left upper  extremity superficial veins was mapped.  The cephalic vein appeared adequate for arteriovenous fistula creation.  The brachial artery was similarly mapped.  The artery appeared adequate for arterial venous creation. We ensured there was no anomalous arterial anatomy such as a high bifurcation.  A transverse incision was made in the left arm just distal to the antecubital crease.  Incision was carried down through subcutaneous tissue until the cephalic vein was identified and skeletonized.  We continued our exposure through the aponeurosis of the biceps.  The brachial artery was encountered its usual position.  The artery was circumferentially exposed and encircled with Silastic Vesseloops.  Patient was systemically heparinized.  The distal cephalic vein was transected.  The distal stump of the cephalic vein was oversewn with a 2-0 silk suture.  The proximal vein was controlled with a bulldog clamp.  The brachial artery was clamped proximally medially.  An anterior arteriotomy was made with a 11 blade.  The arteriotomy was extended with Potts scissors.  Using a parachute technique the cephalic vein was anastomosed to the brachial arteriotomy in end-to-side fashion with continuous running suture of 6-0 Prolene.  Immediately prior to completion the anastomosis was flushed and de-aired.  The anastomosis was completed.  Hemostasis was assured.  The fistula was interrogated with Doppler.  Audible bruit was heard throughout the course of the cephalic vein.  A left radial artery signal was heard which augmented slightly with compression of the fistula.  Upon completion of the case instrument and sharps counts were confirmed correct. The patient was transferred to the PACU in good condition. I was present for all portions of the procedure.  FOLLOW UP PLAN: Assuming a normal postoperative course,  VVS PA will see the patient in 6 weeks with AVF duplex.   Rande Brunt. Lenell Antu, MD [facility]  [facility] Phone Number: [phone] 08/13/2023 9:04 AM

## 2023-08-14 ENCOUNTER — Encounter (HOSPITAL_COMMUNITY): Payer: Self-pay | Admitting: Vascular Surgery

## 2023-09-07 ENCOUNTER — Other Ambulatory Visit: Payer: Self-pay

## 2023-09-07 DIAGNOSIS — N185 Chronic kidney disease, stage 5: Secondary | ICD-10-CM

## 2023-09-21 DIAGNOSIS — N2581 Secondary hyperparathyroidism of renal origin: Secondary | ICD-10-CM | POA: Diagnosis not present

## 2023-09-21 DIAGNOSIS — I5032 Chronic diastolic (congestive) heart failure: Secondary | ICD-10-CM | POA: Diagnosis not present

## 2023-09-21 DIAGNOSIS — D649 Anemia, unspecified: Secondary | ICD-10-CM | POA: Diagnosis not present

## 2023-09-21 DIAGNOSIS — D631 Anemia in chronic kidney disease: Secondary | ICD-10-CM | POA: Diagnosis not present

## 2023-09-21 DIAGNOSIS — E1129 Type 2 diabetes mellitus with other diabetic kidney complication: Secondary | ICD-10-CM | POA: Diagnosis not present

## 2023-09-21 DIAGNOSIS — E211 Secondary hyperparathyroidism, not elsewhere classified: Secondary | ICD-10-CM | POA: Diagnosis not present

## 2023-09-21 DIAGNOSIS — N189 Chronic kidney disease, unspecified: Secondary | ICD-10-CM | POA: Diagnosis not present

## 2023-09-21 DIAGNOSIS — R809 Proteinuria, unspecified: Secondary | ICD-10-CM | POA: Diagnosis not present

## 2023-09-21 DIAGNOSIS — N184 Chronic kidney disease, stage 4 (severe): Secondary | ICD-10-CM | POA: Diagnosis not present

## 2023-09-22 ENCOUNTER — Ambulatory Visit (HOSPITAL_COMMUNITY)
Admission: RE | Admit: 2023-09-22 | Discharge: 2023-09-22 | Disposition: A | Discharge status: Home / Self Care | Payer: Medicare HMO | Source: Ambulatory Visit | Attending: Vascular Surgery | Admitting: Vascular Surgery

## 2023-09-22 DIAGNOSIS — R809 Proteinuria, unspecified: Secondary | ICD-10-CM | POA: Diagnosis not present

## 2023-09-22 DIAGNOSIS — N185 Chronic kidney disease, stage 5: Secondary | ICD-10-CM | POA: Insufficient documentation

## 2023-09-22 DIAGNOSIS — N184 Chronic kidney disease, stage 4 (severe): Secondary | ICD-10-CM | POA: Diagnosis not present

## 2023-09-23 ENCOUNTER — Telehealth: Payer: Self-pay

## 2023-09-23 NOTE — Telephone Encounter (Signed)
Pt's daughter left VM that she had seen her father's dialysis duplex results and was concerned about the occlusive thrombus. MD was made aware of this by U/S tech and pt is scheduled for f.u next week. I called Brandon Warren, pt's daughter back and left message letting her know this. I also tried to reach pt and did not get him.

## 2023-09-26 DIAGNOSIS — N2581 Secondary hyperparathyroidism of renal origin: Secondary | ICD-10-CM | POA: Diagnosis not present

## 2023-09-26 DIAGNOSIS — R808 Other proteinuria: Secondary | ICD-10-CM | POA: Diagnosis not present

## 2023-09-26 DIAGNOSIS — I714 Abdominal aortic aneurysm, without rupture, unspecified: Secondary | ICD-10-CM | POA: Diagnosis not present

## 2023-09-26 DIAGNOSIS — T82590A Other mechanical complication of surgically created arteriovenous fistula, initial encounter: Secondary | ICD-10-CM | POA: Diagnosis not present

## 2023-09-29 ENCOUNTER — Ambulatory Visit (INDEPENDENT_AMBULATORY_CARE_PROVIDER_SITE_OTHER): Payer: Medicare HMO | Admitting: Physician Assistant

## 2023-09-29 VITALS — BP 157/76 | HR 69 | Temp 98.0°F | Resp 18 | Ht 70.0 in | Wt 262.0 lb

## 2023-09-29 DIAGNOSIS — I129 Hypertensive chronic kidney disease with stage 1 through stage 4 chronic kidney disease, or unspecified chronic kidney disease: Secondary | ICD-10-CM

## 2023-09-29 DIAGNOSIS — N184 Chronic kidney disease, stage 4 (severe): Secondary | ICD-10-CM

## 2023-09-29 NOTE — Progress Notes (Signed)
POST OPERATIVE OFFICE NOTE    CC:  F/u for surgery  HPI:  This is a 72 y.o. male who is s/p left BC AV fistula creation on 08/13/23 by Dr. Lenell Antu.    Pt returns today for follow up.  Pt states He is not sure when the fistula stopped working, but he knows the fistula is occluded.  He denies history of clotting disorders and denies history of Afib.   He spoke to Dr. Wolfgang Phoenix does not want [place] to attempt access in the right UE and he plans on sending him to a bariatric surgeon for PD access.  He is not on HD at this time.  He denies left UE weakness, pain or loss of motor.   Allergies  Allergen Reactions   Meat Extract Diarrhea, Nausea Only and Other (See Comments)    alphagal reaction   Lipitor [Atorvastatin] Other (See Comments)    Muscle cramps   Simvastatin Other (See Comments)    Muscle cramps    Allopurinol Rash    Current Outpatient Medications  Medication Sig Dispense Refill   acetaminophen (TYLENOL) 500 MG tablet Take 500 mg by mouth 2 (two) times daily as needed (for pain.).     ALPRAZolam (XANAX) 0.5 MG tablet Take 0.5 mg by mouth daily as needed for anxiety.     aspirin EC 81 MG tablet Take 81 mg by mouth every evening.     carvedilol (COREG) 25 MG tablet TAKE ONE AND ONE-HALF TABLET (37.5MG  TOTAL) BY MOUTH TWO TIMES A DAY 270 tablet 3   Cholecalciferol (VITAMIN D3) 5000 units CAPS Take 1 capsule (5,000 Units total) by mouth daily. 90 capsule 0   diltiazem (CARDIZEM) 30 MG tablet Take 1 tablet (30 mg total) by mouth 2 (two) times daily. 180 tablet 3   EPINEPHrine 0.3 mg/0.3 mL IJ SOAJ injection Inject 0.3 mg into the muscle as needed for anaphylaxis.     Febuxostat 80 MG TABS TAKE ONE TABLET (80 MG TOTAL) BY MOUTH DAILY. 30 tablet 3   ferrous sulfate 325 (65 FE) MG tablet Take 325 mg by mouth 3 (three) times daily.     finasteride (PROSCAR) 5 MG tablet Take 1 tablet (5 mg total) by mouth daily. 90 tablet 3   furosemide (LASIX) 20 MG tablet Take 40 mg by mouth 2 (two) times  daily.     hydrALAZINE (APRESOLINE) 100 MG tablet TAKE ONE TABLET (100MG  TOTAL) BY MOUTH THREE TIMES DAILY 270 tablet 3   levothyroxine (SYNTHROID) 25 MCG tablet Take 25 mcg by mouth daily before breakfast. Take with 200 mg     levothyroxine (SYNTHROID, LEVOTHROID) 200 MCG tablet TAKE ONE TABLET BY MOUTH EVERY DAY BEFORE BREAKFAST (Patient taking differently: Take 200 mcg by mouth daily before breakfast. Take with 25 mcg to equal 225 mcg daily) 90 tablet 0   MITIGARE 0.6 MG CAPS Take 0.6 mg by mouth 2 (two) times daily as needed (gout flare).     omega-3 acid ethyl esters (LOVAZA) 1 g capsule TAKE ONE CAPSULE BY MOUTH EVERY 12 HOURS 7 capsule 0   oxyCODONE-acetaminophen (PERCOCET) 5-325 MG tablet Take 1 tablet by mouth every 6 (six) hours as needed. 8 tablet 0   rosuvastatin (CRESTOR) 5 MG tablet Take 5 mg by mouth daily.     silodosin (RAPAFLO) 8 MG CAPS capsule Take 1 capsule (8 mg total) by mouth at bedtime. 30 capsule 11   sodium bicarbonate 650 MG tablet Take 650 mg by mouth 3 (three) times  daily.     tirzepatide (MOUNJARO) 10 MG/0.5ML Pen Inject 10 mg into the skin once a week. 6 mL 3   No current facility-administered medications for this visit.     ROS:  See HPI  Physical Exam:     Findings:  +--------------------+----------+-----------------+--------+  AVF                PSV (cm/s)Flow Vol (mL/min)Comments  +--------------------+----------+-----------------+--------+  Native artery inflow    91           77                  +--------------------+----------+-----------------+--------+  AVF Anastomosis         0                                +--------------------+----------+-----------------+--------+     +------------+----------+-------------+----------+----------------+  OUTFLOW VEINPSV (cm/s)Diameter (cm)Depth (cm)    Describe      +------------+----------+-------------+----------+----------------+  Shoulder                 0.56        0.96                      +------------+----------+-------------+----------+----------------+  Prox UA         12        0.57        0.75   competing branch  +------------+----------+-------------+----------+----------------+  Mid UA          19        0.47        0.58                     +------------+----------+-------------+----------+----------------+  Dist UA         0         0.45        0.68   competing branch  +------------+----------+-------------+----------+----------------+  AC Fossa        0         0.35        0.77       occluded      +------------+----------+-------------+----------+----------------+        Summary:  Arteriovenous fistula-Occlusive Thrombus noted in the cephalic outflow  vein at  the antecubital fossa.   Incision:  well healed  Extremities:  palpable radial pule doppler ulnar and palmer signals intact. No thrill in the fistula Neuro: sensation and motor are intact.     Assessment/Plan:  This is a 72 y.o. male who is s/p:left BC AV fistula that has occluded.  He states that he and Dr. Wolfgang Phoenix have discussed that he will hold off on right UE access and go with a PD cath.  He is not requiring HD/PD at this time he is CKD stage 4/5.  He does appear to have an acceptable Basilic on the left UE.  He will f/u as needed in the future pending his needs.        Mosetta Pigeon PA-C [facility] [phone]   Clinic MD:  Lenell Antu

## 2023-09-30 DIAGNOSIS — Z23 Encounter for immunization: Secondary | ICD-10-CM | POA: Diagnosis not present

## 2023-09-30 DIAGNOSIS — Z87891 Personal history of nicotine dependence: Secondary | ICD-10-CM | POA: Diagnosis not present

## 2023-09-30 DIAGNOSIS — E1122 Type 2 diabetes mellitus with diabetic chronic kidney disease: Secondary | ICD-10-CM | POA: Diagnosis not present

## 2023-09-30 DIAGNOSIS — I129 Hypertensive chronic kidney disease with stage 1 through stage 4 chronic kidney disease, or unspecified chronic kidney disease: Secondary | ICD-10-CM | POA: Diagnosis not present

## 2023-09-30 DIAGNOSIS — Z7985 Long-term (current) use of injectable non-insulin antidiabetic drugs: Secondary | ICD-10-CM | POA: Diagnosis not present

## 2023-09-30 DIAGNOSIS — N184 Chronic kidney disease, stage 4 (severe): Secondary | ICD-10-CM | POA: Diagnosis not present

## 2023-10-15 DIAGNOSIS — E039 Hypothyroidism, unspecified: Secondary | ICD-10-CM | POA: Diagnosis not present

## 2023-10-15 DIAGNOSIS — N184 Chronic kidney disease, stage 4 (severe): Secondary | ICD-10-CM | POA: Diagnosis not present

## 2023-10-15 DIAGNOSIS — E221 Hyperprolactinemia: Secondary | ICD-10-CM | POA: Diagnosis not present

## 2023-10-15 DIAGNOSIS — E1122 Type 2 diabetes mellitus with diabetic chronic kidney disease: Secondary | ICD-10-CM | POA: Diagnosis not present

## 2023-10-23 ENCOUNTER — Ambulatory Visit: Payer: Medicare HMO | Admitting: Student

## 2023-10-26 DIAGNOSIS — E1122 Type 2 diabetes mellitus with diabetic chronic kidney disease: Secondary | ICD-10-CM | POA: Diagnosis not present

## 2023-10-26 DIAGNOSIS — Z6837 Body mass index (BMI) 37.0-37.9, adult: Secondary | ICD-10-CM | POA: Diagnosis not present

## 2023-10-26 DIAGNOSIS — E221 Hyperprolactinemia: Secondary | ICD-10-CM | POA: Diagnosis not present

## 2023-10-26 DIAGNOSIS — N184 Chronic kidney disease, stage 4 (severe): Secondary | ICD-10-CM | POA: Diagnosis not present

## 2023-10-26 DIAGNOSIS — E039 Hypothyroidism, unspecified: Secondary | ICD-10-CM | POA: Diagnosis not present

## 2023-10-26 DIAGNOSIS — Z794 Long term (current) use of insulin: Secondary | ICD-10-CM | POA: Diagnosis not present

## 2023-11-05 ENCOUNTER — Other Ambulatory Visit (HOSPITAL_COMMUNITY): Payer: Self-pay

## 2023-11-10 DIAGNOSIS — R809 Proteinuria, unspecified: Secondary | ICD-10-CM | POA: Diagnosis not present

## 2023-11-10 DIAGNOSIS — N189 Chronic kidney disease, unspecified: Secondary | ICD-10-CM | POA: Diagnosis not present

## 2023-11-10 DIAGNOSIS — D631 Anemia in chronic kidney disease: Secondary | ICD-10-CM | POA: Diagnosis not present

## 2023-11-16 DIAGNOSIS — N2581 Secondary hyperparathyroidism of renal origin: Secondary | ICD-10-CM | POA: Diagnosis not present

## 2023-11-16 DIAGNOSIS — E1129 Type 2 diabetes mellitus with other diabetic kidney complication: Secondary | ICD-10-CM | POA: Diagnosis not present

## 2023-11-16 DIAGNOSIS — I714 Abdominal aortic aneurysm, without rupture, unspecified: Secondary | ICD-10-CM | POA: Diagnosis not present

## 2023-11-16 DIAGNOSIS — N269 Renal sclerosis, unspecified: Secondary | ICD-10-CM | POA: Diagnosis not present

## 2023-11-16 DIAGNOSIS — E1122 Type 2 diabetes mellitus with diabetic chronic kidney disease: Secondary | ICD-10-CM | POA: Diagnosis not present

## 2023-11-16 DIAGNOSIS — N184 Chronic kidney disease, stage 4 (severe): Secondary | ICD-10-CM | POA: Diagnosis not present

## 2023-11-16 DIAGNOSIS — D638 Anemia in other chronic diseases classified elsewhere: Secondary | ICD-10-CM | POA: Diagnosis not present

## 2023-11-16 DIAGNOSIS — R808 Other proteinuria: Secondary | ICD-10-CM | POA: Diagnosis not present

## 2023-11-16 DIAGNOSIS — I129 Hypertensive chronic kidney disease with stage 1 through stage 4 chronic kidney disease, or unspecified chronic kidney disease: Secondary | ICD-10-CM | POA: Diagnosis not present

## 2023-11-27 DIAGNOSIS — N186 End stage renal disease: Secondary | ICD-10-CM | POA: Diagnosis not present

## 2023-11-27 DIAGNOSIS — I2510 Atherosclerotic heart disease of native coronary artery without angina pectoris: Secondary | ICD-10-CM | POA: Diagnosis not present

## 2023-11-27 DIAGNOSIS — I120 Hypertensive chronic kidney disease with stage 5 chronic kidney disease or end stage renal disease: Secondary | ICD-10-CM | POA: Diagnosis not present

## 2023-11-27 DIAGNOSIS — E119 Type 2 diabetes mellitus without complications: Secondary | ICD-10-CM | POA: Diagnosis not present

## 2023-11-27 DIAGNOSIS — I10 Essential (primary) hypertension: Secondary | ICD-10-CM | POA: Diagnosis not present

## 2023-11-28 DIAGNOSIS — N186 End stage renal disease: Secondary | ICD-10-CM | POA: Diagnosis not present

## 2023-11-28 DIAGNOSIS — Z992 Dependence on renal dialysis: Secondary | ICD-10-CM | POA: Diagnosis not present

## 2023-12-04 ENCOUNTER — Ambulatory Visit: Payer: Medicare HMO | Attending: Student | Admitting: Student

## 2023-12-04 ENCOUNTER — Encounter: Payer: Self-pay | Admitting: Student

## 2023-12-04 VITALS — BP 110/64 | HR 67 | Ht 70.0 in | Wt 244.4 lb

## 2023-12-04 DIAGNOSIS — I2510 Atherosclerotic heart disease of native coronary artery without angina pectoris: Secondary | ICD-10-CM

## 2023-12-04 DIAGNOSIS — R002 Palpitations: Secondary | ICD-10-CM | POA: Diagnosis not present

## 2023-12-04 DIAGNOSIS — E782 Mixed hyperlipidemia: Secondary | ICD-10-CM | POA: Diagnosis not present

## 2023-12-04 DIAGNOSIS — R0609 Other forms of dyspnea: Secondary | ICD-10-CM | POA: Diagnosis not present

## 2023-12-04 DIAGNOSIS — I10 Essential (primary) hypertension: Secondary | ICD-10-CM | POA: Diagnosis not present

## 2023-12-04 DIAGNOSIS — N185 Chronic kidney disease, stage 5: Secondary | ICD-10-CM

## 2023-12-04 NOTE — Progress Notes (Signed)
 Cardiology Office Note    Date:  12/05/2023  ID:  Brandon Warren, DOB 1951/10/17, MRN [id number] Cardiologist: Alvan Carrier, MD    History of Present Illness:    Brandon Warren is a 73 y.o. male with past medical history of CAD (s/p DES to OM in 2005, patent stent by repeat cath in 02/2018 with minimal disease elsewhere), HTN, HLD, OSA, Stage 4 CKD and palpitations (brief SVT and rare PVC's by prior monitor) who presents to the office today for 72-month follow-up.  He was examined by myself in 06/2023 and denied any recent anginal symptoms but had experienced a 17 pound weight gain over the past few weeks despite no changes in his dietary intake. He did have follow-up with Vascular Surgery to discuss access placement for initiation of dialysis. By review of notes, he did undergo left brachiocephalic AV fistula creation on 08/13/2023. A follow-up echocardiogram was also obtained that month which showed his EF was preserved at 60 to 65% with no regional wall motion abnormalities. He did have mild LVH, grade 1 diastolic dysfunction, normal RV function and only trivial MR.  In talking with the patient and his wife today, he reports having lost over 30 pounds within the past few months. This has been due to dietary changes but also due to having his top teeth pulled and requiring dentures. He is not yet comfortable with these and is mostly consuming soft foods. He is planning to undergo peritoneal dialysis catheter placement as his fistula did not take. He does note he has been hypotensive at times and BP is at 110/64 during today's visit. Denies any recent chest pain or palpitations. No specific orthopnea, PND or pitting edema.  Studies Reviewed:   EKG: EKG is ordered today and demonstrates:   EKG Interpretation Date/Time:  Friday December 04 2023 15:42:44 EST Ventricular Rate:  64 PR Interval:  240 QRS Duration:  94 QT Interval:  422 QTC Calculation: 435 R Axis:   10  Text  Interpretation: Sinus rhythm with 1st degree A-V block When compared with ECG of 19-Nov-2022 23:41, PREVIOUS ECG IS PRESENT Confirmed by Johnson Grate ([id number]) on 12/05/2023 7:43:00 PM       Echocardiogram: 07/2023 IMPRESSIONS     1. Left ventricular ejection fraction, by estimation, is 60 to 65%. The  left ventricle has normal function. The left ventricle has no regional  wall motion abnormalities. There is mild left ventricular hypertrophy.  Left ventricular diastolic parameters  are consistent with Grade I diastolic dysfunction (impaired relaxation).  Normal LVEDP.   2. Right ventricular systolic function is normal. The right ventricular  size is normal. Tricuspid regurgitation signal is inadequate for assessing  PA pressure.   3. The mitral valve is normal in structure. Trivial mitral valve  regurgitation. No evidence of mitral stenosis.   4. The aortic valve was not well visualized. Aortic valve regurgitation  is not visualized. No aortic stenosis is present.   Comparison(s): No significant change from prior study.    Physical Exam:   VS:  BP 110/64 (BP Location: Left Arm, Cuff Size: Normal)   Pulse 67   Ht 5' 10 (1.778 m)   Wt 244 lb 6.4 oz (110.9 kg)   SpO2 94%   BMI 35.07 kg/m    Wt Readings from Last 3 Encounters:  12/04/23 244 lb 6.4 oz (110.9 kg)  09/29/23 262 lb (118.8 kg)  08/13/23 266 lb (120.7 kg)     GEN: Pleasant male appearing in no  acute distress NECK: No JVD; No carotid bruits CARDIAC: RRR, no murmurs, rubs, gallops RESPIRATORY:  Clear to auscultation without rales, wheezing or rhonchi  ABDOMEN: Appears non-distended. No obvious abdominal masses. EXTREMITIES: No clubbing or cyanosis. No pitting edema.  Distal pedal pulses are 2+ bilaterally.   Assessment and Plan:   1. CAD/HLD - He is s/p DES to OM in 2005 with patent stent by repeat cath in 02/2018. Echo last year showed a preserved EF of 60-65% with no regional WMA. He denies any recent  anginal symptoms.  - Continue ASA 81mg  daily, Coreg  37.5mg  BID, Crestor  5mg  daily and Lovaza . LDL was at 42 when checked in 2024.   2. Dyspnea on Exertion - He reports symptoms have improved with weight loss. Remains on Lasix  40mg  BID and diuretic therapy has been deferred to Nephrology due to his CKD.   3. Palpitations - Symptoms have overall been well-controlled. Given his soft BP at times, will stop Cardizem  30mg  BID and continue Coreg  37.5mg  BID.   4. HTN - BP is at 110/64 during today's visit but has been lower at times. Currently taking Coreg  37.5mg  BID, short-acting Cardizem  30mg  BID and Hydralazine  100mg  TID. Will stop Cardizem  given no recent palpitations. We reviewed he should follow BP closely with initiation of PD as he may require medication adjustments.   5. Stage 4-5 CKD - Being followed by Dr. Rachele and planning to undergo PD catheter placement later this month.   Signed, Laymon CHRISTELLA Qua, PA-C

## 2023-12-04 NOTE — Patient Instructions (Signed)
 Medication Instructions:  Your physician has recommended you make the following change in your medication:   Stop Taking Diltiazem    *If you need a refill on your cardiac medications before your next appointment, please call your pharmacy*   Lab Work: NONE   If you have labs (blood work) drawn today and your tests are completely normal, you will receive your results only by: MyChart Message (if you have MyChart) OR A paper copy in the mail If you have any lab test that is abnormal or we need to change your treatment, we will call you to review the results.   Testing/Procedures: NONE    Follow-Up: At [facility], you and your health needs are our priority.  As part of our continuing mission to provide you with exceptional heart care, we have created designated Provider Care Teams.  These Care Teams include your primary Cardiologist (physician) and Advanced Practice Providers (APPs -  Physician Assistants and Nurse Practitioners) who all work together to provide you with the care you need, when you need it.  We recommend signing up for the patient portal called MyChart.  Sign up information is provided on this After Visit Summary.  MyChart is used to connect with patients for Virtual Visits (Telemedicine).  Patients are able to view lab/test results, encounter notes, upcoming appointments, etc.  Non-urgent messages can be sent to your provider as well.   To learn more about what you can do with MyChart, go to [email or web address].    Your next appointment:    To Be Determined   Provider:   You may see Alvan Carrier, MD or one of the following Advanced Practice Providers on your designated Care Team:   Laymon Qua, PA-C  Olivia Pavy, [place]     Other Instructions Thank you for choosing [facility]!

## 2023-12-05 ENCOUNTER — Encounter: Payer: Self-pay | Admitting: Student

## 2023-12-14 ENCOUNTER — Other Ambulatory Visit: Payer: Self-pay | Admitting: Cardiology

## 2023-12-14 DIAGNOSIS — R809 Proteinuria, unspecified: Secondary | ICD-10-CM | POA: Diagnosis not present

## 2023-12-14 DIAGNOSIS — D631 Anemia in chronic kidney disease: Secondary | ICD-10-CM | POA: Diagnosis not present

## 2023-12-14 DIAGNOSIS — N184 Chronic kidney disease, stage 4 (severe): Secondary | ICD-10-CM | POA: Diagnosis not present

## 2023-12-14 DIAGNOSIS — D649 Anemia, unspecified: Secondary | ICD-10-CM | POA: Diagnosis not present

## 2023-12-14 DIAGNOSIS — N189 Chronic kidney disease, unspecified: Secondary | ICD-10-CM | POA: Diagnosis not present

## 2023-12-14 DIAGNOSIS — N185 Chronic kidney disease, stage 5: Secondary | ICD-10-CM | POA: Diagnosis not present

## 2023-12-14 DIAGNOSIS — E211 Secondary hyperparathyroidism, not elsewhere classified: Secondary | ICD-10-CM | POA: Diagnosis not present

## 2023-12-18 ENCOUNTER — Other Ambulatory Visit: Payer: Self-pay

## 2023-12-18 ENCOUNTER — Emergency Department (HOSPITAL_COMMUNITY)
Admission: EM | Admit: 2023-12-18 | Discharge: 2023-12-19 | Disposition: A | Discharge status: Home / Self Care | Payer: Medicare HMO | Attending: Emergency Medicine | Admitting: Emergency Medicine

## 2023-12-18 ENCOUNTER — Encounter (HOSPITAL_COMMUNITY): Payer: Self-pay

## 2023-12-18 DIAGNOSIS — Z955 Presence of coronary angioplasty implant and graft: Secondary | ICD-10-CM | POA: Diagnosis not present

## 2023-12-18 DIAGNOSIS — Z7982 Long term (current) use of aspirin: Secondary | ICD-10-CM | POA: Diagnosis not present

## 2023-12-18 DIAGNOSIS — K660 Peritoneal adhesions (postprocedural) (postinfection): Secondary | ICD-10-CM | POA: Diagnosis not present

## 2023-12-18 DIAGNOSIS — R338 Other retention of urine: Secondary | ICD-10-CM | POA: Diagnosis not present

## 2023-12-18 DIAGNOSIS — R339 Retention of urine, unspecified: Secondary | ICD-10-CM

## 2023-12-18 DIAGNOSIS — Z992 Dependence on renal dialysis: Secondary | ICD-10-CM | POA: Diagnosis not present

## 2023-12-18 DIAGNOSIS — Z794 Long term (current) use of insulin: Secondary | ICD-10-CM | POA: Insufficient documentation

## 2023-12-18 DIAGNOSIS — I2510 Atherosclerotic heart disease of native coronary artery without angina pectoris: Secondary | ICD-10-CM | POA: Diagnosis not present

## 2023-12-18 DIAGNOSIS — N186 End stage renal disease: Secondary | ICD-10-CM | POA: Diagnosis not present

## 2023-12-18 DIAGNOSIS — J449 Chronic obstructive pulmonary disease, unspecified: Secondary | ICD-10-CM | POA: Diagnosis not present

## 2023-12-18 DIAGNOSIS — Z6838 Body mass index (BMI) 38.0-38.9, adult: Secondary | ICD-10-CM | POA: Diagnosis not present

## 2023-12-18 DIAGNOSIS — Z79899 Other long term (current) drug therapy: Secondary | ICD-10-CM | POA: Diagnosis not present

## 2023-12-18 DIAGNOSIS — I120 Hypertensive chronic kidney disease with stage 5 chronic kidney disease or end stage renal disease: Secondary | ICD-10-CM | POA: Diagnosis not present

## 2023-12-18 DIAGNOSIS — N401 Enlarged prostate with lower urinary tract symptoms: Secondary | ICD-10-CM | POA: Diagnosis not present

## 2023-12-18 DIAGNOSIS — E1122 Type 2 diabetes mellitus with diabetic chronic kidney disease: Secondary | ICD-10-CM | POA: Diagnosis not present

## 2023-12-18 DIAGNOSIS — I252 Old myocardial infarction: Secondary | ICD-10-CM | POA: Diagnosis not present

## 2023-12-18 NOTE — ED Triage Notes (Signed)
Pt to ED from home with daughter with c/o urinary retention, pt has ESRD and had surgery today for peritoneal dialysis port placement, during procedure Robinul was used, common side effect is urinary retention. Pt daughter says pt still makes a good bit of urine every 2-4 hours and she just gave pt lasix and bladder discomfort has increased, pt has not urinated since surgical procedure today.

## 2023-12-19 ENCOUNTER — Other Ambulatory Visit: Payer: Self-pay

## 2023-12-19 ENCOUNTER — Emergency Department (HOSPITAL_COMMUNITY)
Admission: EM | Admit: 2023-12-19 | Discharge: 2023-12-19 | Disposition: A | Discharge status: Home / Self Care | Payer: Medicare HMO | Source: Home / Self Care | Attending: Emergency Medicine | Admitting: Emergency Medicine

## 2023-12-19 ENCOUNTER — Encounter (HOSPITAL_COMMUNITY): Payer: Self-pay

## 2023-12-19 DIAGNOSIS — R339 Retention of urine, unspecified: Secondary | ICD-10-CM

## 2023-12-19 DIAGNOSIS — Z7982 Long term (current) use of aspirin: Secondary | ICD-10-CM | POA: Insufficient documentation

## 2023-12-19 DIAGNOSIS — Z79899 Other long term (current) drug therapy: Secondary | ICD-10-CM | POA: Insufficient documentation

## 2023-12-19 LAB — URINALYSIS, ROUTINE W REFLEX MICROSCOPIC
Bacteria, UA: NONE SEEN
Bilirubin Urine: NEGATIVE
Bilirubin Urine: NEGATIVE
Glucose, UA: 50 mg/dL — AB
Glucose, UA: 50 mg/dL — AB
Hgb urine dipstick: NEGATIVE
Ketones, ur: NEGATIVE mg/dL
Ketones, ur: NEGATIVE mg/dL
Leukocytes,Ua: NEGATIVE
Leukocytes,Ua: NEGATIVE
Nitrite: NEGATIVE
Nitrite: NEGATIVE
Protein, ur: >=300 mg/dL — AB
Protein, ur: 100 mg/dL — AB
Specific Gravity, Urine: 1.012 (ref 1.005–1.030)
Specific Gravity, Urine: 1.011 (ref 1.005–1.030)
pH: 5 (ref 5.0–8.0)
pH: 5 (ref 5.0–8.0)

## 2023-12-19 MED ORDER — LIDOCAINE HCL URETHRAL/MUCOSAL 2 % EX GEL: 1.0000 | Freq: Once | CUTANEOUS | Status: DC | PRN

## 2023-12-19 MED ORDER — LIDOCAINE HCL URETHRAL/MUCOSAL 2 % EX GEL
1.0000 | Freq: Once | CUTANEOUS | Status: AC
  Administered 2023-12-19: 1 via URETHRAL
  Filled 2023-12-19: qty 10

## 2023-12-19 MED ORDER — OXYBUTYNIN CHLORIDE 5 MG PO TABS: 5.0000 mg | ORAL_TABLET | Freq: Two times a day (BID) | ORAL | 0 refills | Status: DC

## 2023-12-19 NOTE — ED Notes (Signed)
 Pt's standard drainage bag changed to leg bag per pt's request. Pt educated on how to empty and change bag.

## 2023-12-19 NOTE — Discharge Instructions (Addendum)
 Please leave the Foley catheter in place until evaluated by urology.  Return to emergency department immediately for any new or worsening symptoms.

## 2023-12-19 NOTE — ED Provider Notes (Signed)
 [place] EMERGENCY DEPARTMENT AT [facility] Provider Note   CSN: [id number] Arrival date & time: 12/19/23  4098     History  Chief Complaint  Patient presents with   Urinary Retention    Brandon Warren is a 73 y.o. male.  Patient is a 73 year old male who returns to the emergency department secondary to urinary retention.  He was evaluated the emergency department last night secondary to the same complaint during which time Foley catheter was placed but was removed and patient was discharged home.  He has been unable to urinate since that time.  Patient notes that he has had no associated fever, chills, constipation, diarrhea.  He did have a procedure yesterday to have a peritoneal dialysis catheter placed and did undergo general anesthesia.  He does have a history of BPH as well.        Home Medications Prior to Admission medications   Medication Sig Start Date End Date Taking? Authorizing Provider  acetaminophen (TYLENOL) 500 MG tablet Take 500 mg by mouth 2 (two) times daily as needed (for pain.).    [provider]  ALPRAZolam Prudy Feeler) 0.5 MG tablet Take 0.5 mg by mouth daily as needed for anxiety. Patient not taking: Reported on 12/04/2023    [provider]  aspirin EC 81 MG tablet Take 81 mg by mouth every evening.    [provider]  calcitRIOL (ROCALTROL) 0.25 MCG capsule Take 0.25 mcg by mouth 3 (three) times a week. 11/16/23   [provider]  carvedilol (COREG) 25 MG tablet TAKE 1 AND 1/2 TABLETS TWICE DAILY 12/14/23   Antoine Poche, MD  EPINEPHrine 0.3 mg/0.3 mL IJ SOAJ injection Inject 0.3 mg into the muscle as needed for anaphylaxis.    [provider]  Febuxostat 80 MG TABS TAKE ONE TABLET (80 MG TOTAL) BY MOUTH DAILY. 03/26/22   Rice, Jamesetta Orleans, MD  Ferrous Sulfate (FEROSUL PO) Take by mouth.    [provider]  ferrous sulfate 325 (65 FE) MG tablet Take 325 mg by mouth 3 (three) times daily.     [provider]  finasteride (PROSCAR) 5 MG tablet Take 1 tablet (5 mg total) by mouth daily. 03/09/23   McKenzie, Mardene Celeste, MD  furosemide (LASIX) 20 MG tablet Take 40 mg by mouth 2 (two) times daily. 08/28/22   [provider]  hydrALAZINE (APRESOLINE) 100 MG tablet TAKE ONE TABLET (100MG  TOTAL) BY MOUTH THREE TIMES DAILY 01/19/23   Antoine Poche, MD  levothyroxine (SYNTHROID) 25 MCG tablet Take 25 mcg by mouth daily before breakfast. Take with 200 mg    [provider]  levothyroxine (SYNTHROID, LEVOTHROID) 200 MCG tablet TAKE ONE TABLET BY MOUTH EVERY DAY BEFORE BREAKFAST Patient taking differently: Take 200 mcg by mouth daily before breakfast. Take with 25 mcg to equal 225 mcg daily 01/19/19   Roma Kayser, MD  MITIGARE 0.6 MG CAPS Take 0.6 mg by mouth 2 (two) times daily as needed (gout flare). 11/29/20   [provider]  omega-3 acid ethyl esters (LOVAZA) 1 g capsule TAKE ONE CAPSULE BY MOUTH EVERY 12 HOURS 05/02/19   Nida, Denman George, MD  oxyCODONE-acetaminophen (PERCOCET) 5-325 MG tablet Take 1 tablet by mouth every 6 (six) hours as needed. 08/13/23   Rhyne, Ames Coupe, PA-C  PARoxetine (PAXIL) 10 MG tablet Take 10 mg by mouth daily. 11/19/23   [provider]  rosuvastatin (CRESTOR) 5 MG tablet Take 5 mg by mouth  daily. 02/25/22   [provider]  silodosin (RAPAFLO) 8 MG CAPS capsule Take 1 capsule (8 mg total) by mouth at bedtime. 03/09/23   McKenzie, Mardene Celeste, MD  sodium bicarbonate 650 MG tablet Take 650 mg by mouth 3 (three) times daily. 09/04/21   [provider]  tirzepatide Greggory Keen) 10 MG/0.5ML Pen Inject 10 mg into the skin once a week. 07/27/23         Allergies    Meat extract, Lipitor [atorvastatin], Simvastatin, Allopurinol, and Alpha-gal    Review of Systems   Review of Systems  Genitourinary:  Positive for decreased urine volume.    Physical Exam Updated Vital Signs BP (!) 174/93 (BP Location:  Right Arm)   Pulse 67   Temp 97.7 F (36.5 C) (Oral)   Resp 18   Ht 5\' 10"  (1.778 m)   Wt 110.9 kg   SpO2 94%   BMI 35.07 kg/m  Physical Exam Vitals reviewed.  Constitutional:      Appearance: Normal appearance.  HENT:     Head: Normocephalic and atraumatic.     Nose: Nose normal.     Mouth/Throat:     Mouth: Mucous membranes are moist.  Eyes:     Extraocular Movements: Extraocular movements intact.     Conjunctiva/sclera: Conjunctivae normal.     Pupils: Pupils are equal, round, and reactive to light.  Cardiovascular:     Rate and Rhythm: Normal rate and regular rhythm.     Pulses: Normal pulses.     Heart sounds: Normal heart sounds.  Pulmonary:     Effort: Pulmonary effort is normal. No respiratory distress.     Breath sounds: Normal breath sounds.  Abdominal:     General: Abdomen is flat. Bowel sounds are normal. There is no distension.     Palpations: Abdomen is soft. There is no mass.     Tenderness: There is abdominal tenderness. There is no right CVA tenderness or left CVA tenderness.  Genitourinary:    Penis: Normal.      Testes: Normal.  Musculoskeletal:        General: Normal range of motion.     Cervical back: Normal range of motion and neck supple.  Skin:    General: Skin is warm and dry.  Neurological:     General: No focal deficit present.     Mental Status: He is alert and oriented to person, place, and time. Mental status is at baseline.  Psychiatric:        Mood and Affect: Mood normal.        Behavior: Behavior normal.        Thought Content: Thought content normal.        Judgment: Judgment normal.     ED Results / Procedures / Treatments   Labs (all labs ordered are listed, but only abnormal results are displayed) Labs Reviewed  URINALYSIS, ROUTINE W REFLEX MICROSCOPIC - Abnormal; Notable for the following components:      Result Value   Color, Urine STRAW (*)    Glucose, UA 50 (*)    Hgb urine dipstick SMALL (*)    Protein, ur 100  (*)    Bacteria, UA RARE (*)    All other components within normal limits    EKG None  Radiology No results found.  Procedures Procedures    Medications Ordered in ED Medications  lidocaine (XYLOCAINE) 2 % jelly 1 Application (has no administration in time range)  lidocaine (XYLOCAINE) 2 %  jelly 1 Application (1 Application Urethral Given 12/19/23 1610)    ED Course/ Medical Decision Making/ A&P                                 Medical Decision Making Amount and/or Complexity of Data Reviewed Labs: ordered.  Risk Prescription drug management.   This patient presents to the ED for concern of urinary retention differential diagnosis includes acute urinary retention, urinary tract infection, BPH    Additional history obtained:  Additional history obtained from daughter External records from outside source obtained and reviewed including medical records   Lab Tests:  I Ordered, and personally interpreted labs.  The pertinent results include: Negative urinalysis    Medicines ordered and prescription drug management:  I ordered medication including oxybutynin for urethral spasms Reevaluation of the patient after these medicines showed that the patient improved I have reviewed the patients home medicines and have made adjustments as needed   Problem List / ED Course:  Patient is doing well at this time and is stable for discharge home.  Discussed with patient that urinalysis demonstrated no signs of acute urinary tract infection.  Patient did have Foley catheter placed and will remain in place until evaluated by his urologist Dr. Ronne Binning.  Patient notes that symptoms have resolved with placement of the Foley catheter.  Continued Foley catheter care was discussed at the bedside.  Will provide a prescription for low-dose oxybutynin to help with spasms.  Strict return precautions were discussed for any new or worsening symptoms.  Patient and daughter voiced  understanding had no additional questions.   Social Determinants of Health:  None           Final Clinical Impression(s) / ED Diagnoses Final diagnoses:  Urinary retention    Rx / DC Orders ED Discharge Orders     None         Kathlen Mody 12/19/23 1022    Benjiman Core, MD 12/19/23 [phone]

## 2023-12-19 NOTE — ED Provider Notes (Signed)
 [place] EMERGENCY DEPARTMENT AT [facility] Provider Note   CSN: [id number] Arrival date & time: 12/18/23  2138     History  Chief Complaint  Patient presents with   Urinary Retention    Brandon Warren is a 73 y.o. male.  Patient is a 73 year old male with history of end-stage renal disease.  Patient had a peritoneal dialysis catheter placed today at [facility].  This was done under general anesthesia.  Patient returned home this evening, and has been unable to void ever since.  He is feeling distended and uncomfortable.  No fevers or chills.  He does have a history of prostate enlargement, but has never had urinary retention in the past.  The history is provided by the patient.       Home Medications Prior to Admission medications   Medication Sig Start Date End Date Taking? Authorizing Provider  acetaminophen (TYLENOL) 500 MG tablet Take 500 mg by mouth 2 (two) times daily as needed (for pain.).    [provider]  ALPRAZolam Prudy Feeler) 0.5 MG tablet Take 0.5 mg by mouth daily as needed for anxiety. Patient not taking: Reported on 12/04/2023    [provider]  aspirin EC 81 MG tablet Take 81 mg by mouth every evening.    [provider]  calcitRIOL (ROCALTROL) 0.25 MCG capsule Take 0.25 mcg by mouth 3 (three) times a week. 11/16/23   [provider]  carvedilol (COREG) 25 MG tablet TAKE 1 AND 1/2 TABLETS TWICE DAILY 12/14/23   Antoine Poche, MD  EPINEPHrine 0.3 mg/0.3 mL IJ SOAJ injection Inject 0.3 mg into the muscle as needed for anaphylaxis.    [provider]  Febuxostat 80 MG TABS TAKE ONE TABLET (80 MG TOTAL) BY MOUTH DAILY. 03/26/22   Rice, Jamesetta Orleans, MD  Ferrous Sulfate (FEROSUL PO) Take by mouth.    [provider]  ferrous sulfate 325 (65 FE) MG tablet Take 325 mg by mouth 3 (three) times daily.    [provider]  finasteride (PROSCAR) 5 MG tablet Take 1 tablet (5 mg total) by mouth  daily. 03/09/23   McKenzie, Mardene Celeste, MD  furosemide (LASIX) 20 MG tablet Take 40 mg by mouth 2 (two) times daily. 08/28/22   [provider]  hydrALAZINE (APRESOLINE) 100 MG tablet TAKE ONE TABLET (100MG  TOTAL) BY MOUTH THREE TIMES DAILY 01/19/23   Antoine Poche, MD  levothyroxine (SYNTHROID) 25 MCG tablet Take 25 mcg by mouth daily before breakfast. Take with 200 mg    [provider]  levothyroxine (SYNTHROID, LEVOTHROID) 200 MCG tablet TAKE ONE TABLET BY MOUTH EVERY DAY BEFORE BREAKFAST Patient taking differently: Take 200 mcg by mouth daily before breakfast. Take with 25 mcg to equal 225 mcg daily 01/19/19   Roma Kayser, MD  MITIGARE 0.6 MG CAPS Take 0.6 mg by mouth 2 (two) times daily as needed (gout flare). 11/29/20   [provider]  omega-3 acid ethyl esters (LOVAZA) 1 g capsule TAKE ONE CAPSULE BY MOUTH EVERY 12 HOURS 05/02/19   Nida, Denman George, MD  oxyCODONE-acetaminophen (PERCOCET) 5-325 MG tablet Take 1 tablet by mouth every 6 (six) hours as needed. 08/13/23   Rhyne, Ames Coupe, PA-C  PARoxetine (PAXIL) 10 MG tablet Take 10 mg by mouth daily. 11/19/23   [provider]  rosuvastatin (CRESTOR) 5 MG tablet Take 5 mg by mouth daily. 02/25/22   [provider]  silodosin (RAPAFLO) 8 MG CAPS capsule Take 1  capsule (8 mg total) by mouth at bedtime. 03/09/23   McKenzie, Mardene Celeste, MD  sodium bicarbonate 650 MG tablet Take 650 mg by mouth 3 (three) times daily. 09/04/21   [provider]  tirzepatide Greggory Keen) 10 MG/0.5ML Pen Inject 10 mg into the skin once a week. 07/27/23         Allergies    Meat extract, Lipitor [atorvastatin], Simvastatin, Allopurinol, and Alpha-gal    Review of Systems   Review of Systems  All other systems reviewed and are negative.   Physical Exam Updated Vital Signs BP (!) 182/86 (BP Location: Right Arm)   Pulse 77   Temp 98 F (36.7 C) (Oral)   Resp 16   Ht 5\' 10"  (1.778 m)   Wt 110.9 kg    SpO2 96%   BMI 35.07 kg/m  Physical Exam Vitals and nursing note reviewed.  Constitutional:      General: He is not in acute distress.    Appearance: He is well-developed. He is not diaphoretic.  HENT:     Head: Normocephalic and atraumatic.  Pulmonary:     Effort: Pulmonary effort is normal.  Abdominal:     General: Bowel sounds are normal. There is no distension.     Palpations: Abdomen is soft.     Tenderness: There is no abdominal tenderness.  Musculoskeletal:        General: Normal range of motion.     Cervical back: Normal range of motion and neck supple.  Skin:    General: Skin is warm and dry.  Neurological:     Mental Status: He is alert and oriented to person, place, and time.     Coordination: Coordination normal.     ED Results / Procedures / Treatments   Labs (all labs ordered are listed, but only abnormal results are displayed) Labs Reviewed  URINALYSIS, ROUTINE W REFLEX MICROSCOPIC    EKG None  Radiology No results found.  Procedures Procedures    Medications Ordered in ED Medications - No data to display  ED Course/ Medical Decision Making/ A&P  Patient is a 73 year old male presenting with urinary retention.  He underwent peritoneal dialysis catheter placement today in [place].  This was done under general anesthesia and patient has been unable to void since arriving home.  Catheter was placed with drainage of nearly 1000 cc of urine.  Urinalysis inconsistent with infection.  Suspect anesthesia related.  Offered to leave the catheter in place, however patient would like to have it removed and see if he is unable to void.  He will return if his symptoms recur.  Final Clinical Impression(s) / ED Diagnoses Final diagnoses:  None    Rx / DC Orders ED Discharge Orders     None         Geoffery Lyons, MD 12/19/23 [phone]

## 2023-12-19 NOTE — Discharge Instructions (Signed)
 Your urinalysis was clear and showed no evidence for infection.  Return to the emergency department if you develop recurrent urinary retention, or for other new and concerning symptoms.

## 2023-12-19 NOTE — ED Triage Notes (Signed)
 Pt c/o urinary retention since being seen last night. Pt had peritoneal dialysis port placed yesterday and came to this ED last night with retention. Per family and pt the provider that saw him last night stated after discharge if he was unable to urinate after the I & O cath to come back and we would place a catheter and refer to urology.

## 2023-12-21 ENCOUNTER — Telehealth: Payer: Self-pay

## 2023-12-21 ENCOUNTER — Ambulatory Visit (INDEPENDENT_AMBULATORY_CARE_PROVIDER_SITE_OTHER): Payer: Medicare HMO

## 2023-12-21 ENCOUNTER — Telehealth: Payer: Self-pay | Admitting: Urology

## 2023-12-21 DIAGNOSIS — Z435 Encounter for attention to cystostomy: Secondary | ICD-10-CM

## 2023-12-21 DIAGNOSIS — R339 Retention of urine, unspecified: Secondary | ICD-10-CM

## 2023-12-21 NOTE — Telephone Encounter (Addendum)
 Dr. Gus Height surgeon put PD cath on friday do want patient to have cath in due to the risk of UTI  infection which can cause more complication with the PD cath.  NP Tiffany state's she can take cath out and she can send over any surgical notes needed.  Dr. Ronne Binning state's that patient could potentially get an  infection due to cath being placed and removal. Per Dr. Ronne Binning patient need to D/C Oxybutynin and continued Rapaflo to help get cath out through a voiding trial within one week.  Tiffany, NP state's she can take patient cath out in one week if needed.  Tiffany, NP is made aware of patient appointment for voiding trial. Tiffany, NP is made aware of MD verbal orders.

## 2023-12-21 NOTE — Progress Notes (Signed)
 Patient came in today to have cath bag change from leg bag to overnight bag.Marland Kitchen

## 2023-12-21 NOTE — Telephone Encounter (Signed)
 Urinary Retenion Patient called with difficulty voiding symptoms  Frequency: unable to void Symptom onset:24 hours went to ER 12/18/2023 had cath place took oxybutynin 1 day Pain no  Is the patient on any overactive bladder medications?  Yes Oxbutynin  Is the patient on patient take any BPH medications? Yes Rapflo   Any Recent Urologic Surgeries or Procedures: no What LAPAROSCOPIC PERITONEAL DIALYSIS CATHETER INSERTION WITH OMENTOPEXY   Any History Of: Stones: unsure Recurrent UTI's: unsure Any history of BPH (male only) yes Any history of urinary retention: unsure  Plan: Triage sent to MD for next steps

## 2023-12-21 NOTE — Telephone Encounter (Signed)
 Return call to patient. Patient state's he has to empty his leg bag every two hours. Patient is made aware to come to office to get his bag change to his overnight bag. Patient voiced understanding.

## 2023-12-21 NOTE — Telephone Encounter (Signed)
 Had surgery for peritoneal cath last week, cannot urinate now, went to ED twice and they told him to follow up with [place] asap.

## 2023-12-21 NOTE — Telephone Encounter (Signed)
 Pt was advised to call the MD in charge of placing his peritoneal cath and also notified that we would f/u with MD McKenzie for his advisement as well

## 2023-12-24 DIAGNOSIS — N186 End stage renal disease: Secondary | ICD-10-CM | POA: Diagnosis not present

## 2023-12-24 DIAGNOSIS — Z1330 Encounter for screening examination for mental health and behavioral disorders, unspecified: Secondary | ICD-10-CM | POA: Diagnosis not present

## 2023-12-31 DIAGNOSIS — E119 Type 2 diabetes mellitus without complications: Secondary | ICD-10-CM | POA: Diagnosis not present

## 2023-12-31 DIAGNOSIS — I2510 Atherosclerotic heart disease of native coronary artery without angina pectoris: Secondary | ICD-10-CM | POA: Diagnosis not present

## 2023-12-31 DIAGNOSIS — I10 Essential (primary) hypertension: Secondary | ICD-10-CM | POA: Diagnosis not present

## 2023-12-31 DIAGNOSIS — G4733 Obstructive sleep apnea (adult) (pediatric): Secondary | ICD-10-CM | POA: Diagnosis not present

## 2023-12-31 DIAGNOSIS — I120 Hypertensive chronic kidney disease with stage 5 chronic kidney disease or end stage renal disease: Secondary | ICD-10-CM | POA: Diagnosis not present

## 2023-12-31 DIAGNOSIS — N186 End stage renal disease: Secondary | ICD-10-CM | POA: Diagnosis not present

## 2024-01-05 NOTE — Telephone Encounter (Signed)
 Yes he does.

## 2024-01-06 DIAGNOSIS — J449 Chronic obstructive pulmonary disease, unspecified: Secondary | ICD-10-CM | POA: Diagnosis not present

## 2024-01-06 DIAGNOSIS — N186 End stage renal disease: Secondary | ICD-10-CM | POA: Diagnosis not present

## 2024-01-06 DIAGNOSIS — E1122 Type 2 diabetes mellitus with diabetic chronic kidney disease: Secondary | ICD-10-CM | POA: Diagnosis not present

## 2024-01-06 DIAGNOSIS — T82848A Pain from vascular prosthetic devices, implants and grafts, initial encounter: Secondary | ICD-10-CM | POA: Diagnosis not present

## 2024-01-06 DIAGNOSIS — I2510 Atherosclerotic heart disease of native coronary artery without angina pectoris: Secondary | ICD-10-CM | POA: Diagnosis not present

## 2024-01-06 DIAGNOSIS — Z7985 Long-term (current) use of injectable non-insulin antidiabetic drugs: Secondary | ICD-10-CM | POA: Diagnosis not present

## 2024-01-06 DIAGNOSIS — T85691A Other mechanical complication of intraperitoneal dialysis catheter, initial encounter: Secondary | ICD-10-CM | POA: Diagnosis not present

## 2024-01-06 DIAGNOSIS — Z955 Presence of coronary angioplasty implant and graft: Secondary | ICD-10-CM | POA: Diagnosis not present

## 2024-01-06 DIAGNOSIS — I120 Hypertensive chronic kidney disease with stage 5 chronic kidney disease or end stage renal disease: Secondary | ICD-10-CM | POA: Diagnosis not present

## 2024-01-06 DIAGNOSIS — Z992 Dependence on renal dialysis: Secondary | ICD-10-CM | POA: Diagnosis not present

## 2024-01-11 ENCOUNTER — Ambulatory Visit: Payer: Medicare HMO

## 2024-01-18 ENCOUNTER — Ambulatory Visit: Admitting: Urology

## 2024-01-18 ENCOUNTER — Encounter: Payer: Self-pay | Admitting: Urology

## 2024-01-18 VITALS — BP 116/68 | HR 98

## 2024-01-18 DIAGNOSIS — N401 Enlarged prostate with lower urinary tract symptoms: Secondary | ICD-10-CM

## 2024-01-18 DIAGNOSIS — R339 Retention of urine, unspecified: Secondary | ICD-10-CM | POA: Diagnosis not present

## 2024-01-18 DIAGNOSIS — R310 Gross hematuria: Secondary | ICD-10-CM | POA: Diagnosis not present

## 2024-01-18 LAB — BLADDER SCAN AMB NON-IMAGING: Scan Result: 3

## 2024-01-18 MED ORDER — SILODOSIN 8 MG PO CAPS: 8.0000 mg | ORAL_CAPSULE | Freq: Two times a day (BID) | ORAL | 11 refills | Status: DC

## 2024-01-18 NOTE — Progress Notes (Signed)
 01/18/2024 10:03 AM   Brandon Warren 1951/05/15 [id number]  Referring provider: Elfredia Nevins, MD [address] [place],  [place] [address]  Followup urinary retention  HPI: Brandon Warren is a 72yo here for followup for BPh and urinary retention. He had a PD catheter placed in February and developed urinary retention. He took 2 does of oxybutynin post op.  Foley was in for 10 days. PVR 3cc. IPSS 16 QOL 2 on rapaflo 8mg  and finasteride 5mg  daily. He has intermittent straining to urinate and an intermittent weak urinary stream. Nocturia 1-3x.    PMH: Past Medical History:  Diagnosis Date   Anemia    Arthritis    Back pain    BPH (benign prostatic hyperplasia)    CAD S/P percutaneous coronary angioplasty    a. 2005: PCI OM - Zomax study stent DES  2.5 x16 mm. b. cath in 02/2018 showing 20% ISR of LCx stent and 20% stenosis along mid-distal LM.    CKD (chronic kidney disease)    Controlled type 2 diabetes with neuropathy (HCC)    Diastolic heart failure (HCC)    Grade 1   Edema of both lower extremities    Food allergy    Gout    Hypercholesterolemia    Hypertension    Hypothyroidism    Kidney disease    MI (myocardial infarction) (HCC)    in his 95s   Sleep apnea    SOB (shortness of breath)     Surgical History: Past Surgical History:  Procedure Laterality Date   APPENDECTOMY     AV FISTULA PLACEMENT Left 08/13/2023   Procedure: LEFT ARM BARCHIOCEPHALIC ARTERIOVENOUS (AV) FISTULA CREATION;  Surgeon: Leonie Douglas, MD;  Location: [facility] OR;  Service: Vascular;  Laterality: Left;   CATARACT EXTRACTION Left    COLONOSCOPY N/A 02/01/2014   Procedure: COLONOSCOPY;  Surgeon: Corbin Ade, MD;  Location: [facility];  Service: Endoscopy;  Laterality: N/A;  8:30   CORONARY ANGIOPLASTY WITH STENT PLACEMENT  2005   PCI- OM1 Zomax study stent 2.5 x16 mm.   LEFT HEART CATH AND CORONARY ANGIOGRAPHY N/A 03/15/2018   Procedure: LEFT HEART CATH AND CORONARY ANGIOGRAPHY;   Surgeon: Marykay Lex, MD;  Location: [facility] INVASIVE CV LAB;  Service: Cardiovascular;  Laterality: N/A;    Home Medications:  Allergies as of 01/18/2024       Reactions   Meat Extract Diarrhea, Nausea Only, Other (See Comments)   alphagal reaction   Lipitor [atorvastatin] Other (See Comments)   Muscle cramps   Simvastatin Other (See Comments)   Muscle cramps   Allopurinol Rash   Alpha-gal Diarrhea        Medication List        Accurate as of January 18, 2024 10:03 AM. If you have any questions, ask your nurse or doctor.          acetaminophen 500 MG tablet Commonly known as: TYLENOL Take 500 mg by mouth 2 (two) times daily as needed (for pain.).   ALPRAZolam 0.5 MG tablet Commonly known as: XANAX Take 0.5 mg by mouth daily as needed for anxiety.   aspirin EC 81 MG tablet Take 81 mg by mouth every evening.   calcitRIOL 0.25 MCG capsule Commonly known as: ROCALTROL Take 0.25 mcg by mouth 3 (three) times a week.   carvedilol 25 MG tablet Commonly known as: COREG TAKE 1 AND 1/2 TABLETS TWICE DAILY   EPINEPHrine 0.3 mg/0.3 mL Soaj injection Commonly known as:  EPI-PEN Inject 0.3 mg into the muscle as needed for anaphylaxis.   Febuxostat 80 MG Tabs TAKE ONE TABLET (80 MG TOTAL) BY MOUTH DAILY.   FEROSUL PO Take by mouth. What changed: Another medication with the same name was removed. Continue taking this medication, and follow the directions you see here.   finasteride 5 MG tablet Commonly known as: PROSCAR Take 1 tablet (5 mg total) by mouth daily.   furosemide 20 MG tablet Commonly known as: LASIX Take 40 mg by mouth 2 (two) times daily.   hydrALAZINE 100 MG tablet Commonly known as: APRESOLINE TAKE ONE TABLET (100MG  TOTAL) BY MOUTH THREE TIMES DAILY   levothyroxine 25 MCG tablet Commonly known as: SYNTHROID Take 25 mcg by mouth daily before breakfast. Take with 200 mg What changed: Another medication with the same name was removed. Continue taking  this medication, and follow the directions you see here.   Mitigare 0.6 MG Caps Generic drug: Colchicine Take 0.6 mg by mouth 2 (two) times daily as needed (gout flare).   Mounjaro 10 MG/0.5ML Pen Generic drug: tirzepatide Inject 10 mg into the skin once a week.   omega-3 acid ethyl esters 1 g capsule Commonly known as: LOVAZA TAKE ONE CAPSULE BY MOUTH EVERY 12 HOURS   oxybutynin 5 MG tablet Commonly known as: DITROPAN Take 1 tablet (5 mg total) by mouth 2 (two) times daily.   oxyCODONE-acetaminophen 5-325 MG tablet Commonly known as: Percocet Take 1 tablet by mouth every 6 (six) hours as needed.   PARoxetine 10 MG tablet Commonly known as: PAXIL Take 10 mg by mouth daily.   rosuvastatin 5 MG tablet Commonly known as: CRESTOR Take 5 mg by mouth daily.   silodosin 8 MG Caps capsule Commonly known as: RAPAFLO Take 1 capsule (8 mg total) by mouth at bedtime.   sodium bicarbonate 650 MG tablet Take 650 mg by mouth 3 (three) times daily.        Allergies:  Allergies  Allergen Reactions   Meat Extract Diarrhea, Nausea Only and Other (See Comments)    alphagal reaction   Lipitor [Atorvastatin] Other (See Comments)    Muscle cramps   Simvastatin Other (See Comments)    Muscle cramps    Allopurinol Rash   Alpha-Gal Diarrhea    Family History: Family History  Problem Relation Age of Onset   Colon polyps Mother    Hypertension Mother    High Cholesterol Father    Heart disease Father    COPD Father    Thyroid disease Daughter    Healthy Daughter    Healthy Other    Colon cancer Neg Hx     Social History:  reports that he quit smoking about 19 years ago. His smoking use included cigarettes. He started smoking about 49 years ago. He has a 60 pack-year smoking history. He has never used smokeless tobacco. He reports that he does not currently use alcohol. He reports that he does not use drugs.  ROS: All other review of systems were reviewed and are negative  except what is noted above in HPI  Physical Exam: BP 116/68   Pulse 98   Constitutional:  Alert and oriented, No acute distress. HEENT: [place] AT, moist mucus membranes.  Trachea midline, no masses. Cardiovascular: No clubbing, cyanosis, or edema. Respiratory: Normal respiratory effort, no increased work of breathing. GI: Abdomen is soft, nontender, nondistended, no abdominal masses GU: No CVA tenderness.  Lymph: No cervical or inguinal lymphadenopathy. Skin: No rashes, bruises  or suspicious lesions. Neurologic: Grossly intact, no focal deficits, moving all 4 extremities. Psychiatric: Normal mood and affect.  Laboratory Data: Lab Results  Component Value Date   WBC 10.9 (H) 11/21/2022   HGB 11.9 (L) 08/13/2023   HCT 35.0 (L) 08/13/2023   MCV 89.7 11/21/2022   PLT 182 11/21/2022    Lab Results  Component Value Date   CREATININE 4.30 (H) 08/13/2023    No results found for: "PSA"  No results found for: "TESTOSTERONE"  Lab Results  Component Value Date   HGBA1C 6.5 (H) 11/20/2022    Urinalysis    Component Value Date/Time   COLORURINE STRAW (A) 12/19/2023 0930   APPEARANCEUR CLEAR 12/19/2023 0930   APPEARANCEUR Clear 12/04/2015 1038   LABSPEC 1.011 12/19/2023 0930   PHURINE 5.0 12/19/2023 0930   GLUCOSEU 50 (A) 12/19/2023 0930   HGBUR SMALL (A) 12/19/2023 0930   BILIRUBINUR NEGATIVE 12/19/2023 0930   BILIRUBINUR Negative 12/04/2015 1038   KETONESUR NEGATIVE 12/19/2023 0930   PROTEINUR 100 (A) 12/19/2023 0930   NITRITE NEGATIVE 12/19/2023 0930   LEUKOCYTESUR NEGATIVE 12/19/2023 0930    Lab Results  Component Value Date   LABMICR See below: 12/04/2015   WBCUA 0-5 12/04/2015   RBCUA 0-2 12/04/2015   LABEPIT 0-10 12/04/2015   BACTERIA RARE (A) 12/19/2023    Pertinent Imaging:  No results found for this or any previous visit.  No results found for this or any previous visit.  No results found for this or any previous visit.  No results found for this or  any previous visit.  Results for orders placed during the hospital encounter of 05/13/21  US RENAL  Narrative CLINICAL DATA:  Acute renal failure  EXAM: RENAL / URINARY TRACT ULTRASOUND COMPLETE  COMPARISON:  Ultrasound 11/05/2017  FINDINGS: Right Kidney:  Renal measurements: 15.2 x 6.8 x 7.7 cm = volume: 416.7 mL. Echogenicity within normal limits. No mass or hydronephrosis visualized.  Left Kidney:  Renal measurements: 13 x 6.8 x 5.7 cm = volume: 263.4 mL. Echogenicity within normal limits. No hydronephrosis. Small cyst in the upper pole measuring up to 15 mm.  Bladder:  Appears normal for degree of bladder distention.  Other:  None.  IMPRESSION: 1. Kidneys appear large in size bilaterally but without hydronephrosis nor significant change compared to prior ultrasound. 2. Small cysts in the left kidney   Electronically Signed By: Jasmine Pang M.D. On: 05/13/2021 22:16  No results found for this or any previous visit.  No results found for this or any previous visit.  Results for orders placed during the hospital encounter of 11/19/22  CT Renal Stone Study  Narrative CLINICAL DATA:  Hematuria.  EXAM: CT ABDOMEN AND PELVIS WITHOUT CONTRAST  TECHNIQUE: Multidetector CT imaging of the abdomen and pelvis was performed following the standard protocol without IV contrast.  RADIATION DOSE REDUCTION: This exam was performed according to the departmental dose-optimization program which includes automated exposure control, adjustment of the mA and/or kV according to patient size and/or use of iterative reconstruction technique.  COMPARISON:  None Available.  FINDINGS: Lower chest: No acute abnormality.  Hepatobiliary: No focal liver abnormality is seen. The gallbladder is moderately distended. No gallstones, gallbladder wall thickening, or biliary dilatation.  Pancreas: Punctate calcifications are seen scattered throughout the pancreatic  parenchyma. No pancreatic ductal dilatation or surrounding inflammatory changes.  Spleen: A 2.1 cm diameter area of low attenuation is within an otherwise normal-appearing spleen.  Adrenals/Urinary Tract: Adrenal glands are unremarkable. Kidneys are normal  in size, without renal calculi, focal lesion, or hydronephrosis. The urinary bladder is poorly distended and subsequently limited in evaluation. Moderate severity diffuse urinary bladder wall thickening is noted. Very mild hazy surrounding inflammatory fat stranding is seen.  Stomach/Bowel: Stomach is within normal limits. The appendix is not identified. No evidence of bowel wall thickening, distention, or inflammatory changes. Noninflamed diverticula are seen throughout the descending and sigmoid colon.  Vascular/Lymphatic: Aortic atherosclerosis. No enlarged abdominal or pelvic lymph nodes.  Reproductive: The prostate gland is mildly enlarged. Mild prostate gland calcification is also noted.  Other: No abdominal wall hernia or abnormality. No abdominopelvic ascites.  Musculoskeletal: There is grade 2 anterolisthesis of the L5 vertebral body on S1 with marked severity multilevel degenerative changes noted throughout the lumbar spine.  IMPRESSION: 1. Findings consistent with acute cystitis. Correlation with urinalysis is recommended. 2. Colonic diverticulosis. 3. Grade 2 anterolisthesis of the L5 vertebral body on S1 with marked severity multilevel degenerative changes throughout the lumbar spine. 4. 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 Correlation with nonemergent splenic ultrasound is recommended. 5. Aortic atherosclerosis.  Aortic Atherosclerosis (ICD10-I70.0).   Electronically Signed By: Aram Candela M.D. On: 11/19/2022 23:38   Assessment & Plan:    1. Benign prostatic hyperplasia with incomplete bladder emptying (Primary) Rapaflo 8mg  daily and finasteride 5mg  daily -  Urinalysis, Routine w reflex microscopic - BLADDER SCAN AMB NON-IMAGING  2. Urinary retention Rapaflo 8mg  daily and finasteride 5mg  daily     No follow-ups on file.  Wilkie Aye, MD  [facility] [place]

## 2024-01-18 NOTE — Patient Instructions (Signed)

## 2024-01-18 NOTE — Progress Notes (Signed)
 post void residual=3

## 2024-01-19 ENCOUNTER — Ambulatory Visit: Payer: Medicare HMO | Admitting: Urology

## 2024-01-20 DIAGNOSIS — I2510 Atherosclerotic heart disease of native coronary artery without angina pectoris: Secondary | ICD-10-CM | POA: Diagnosis not present

## 2024-01-20 DIAGNOSIS — E119 Type 2 diabetes mellitus without complications: Secondary | ICD-10-CM | POA: Diagnosis not present

## 2024-01-20 DIAGNOSIS — G4733 Obstructive sleep apnea (adult) (pediatric): Secondary | ICD-10-CM | POA: Diagnosis not present

## 2024-01-20 DIAGNOSIS — N186 End stage renal disease: Secondary | ICD-10-CM | POA: Diagnosis not present

## 2024-01-23 ENCOUNTER — Encounter: Payer: Self-pay | Admitting: Urology

## 2024-01-25 DIAGNOSIS — E1122 Type 2 diabetes mellitus with diabetic chronic kidney disease: Secondary | ICD-10-CM | POA: Diagnosis not present

## 2024-01-25 DIAGNOSIS — Z13220 Encounter for screening for lipoid disorders: Secondary | ICD-10-CM | POA: Diagnosis not present

## 2024-01-25 DIAGNOSIS — N186 End stage renal disease: Secondary | ICD-10-CM | POA: Diagnosis not present

## 2024-01-25 DIAGNOSIS — Z1159 Encounter for screening for other viral diseases: Secondary | ICD-10-CM | POA: Diagnosis not present

## 2024-01-25 DIAGNOSIS — Z992 Dependence on renal dialysis: Secondary | ICD-10-CM | POA: Diagnosis not present

## 2024-01-26 DIAGNOSIS — N186 End stage renal disease: Secondary | ICD-10-CM | POA: Diagnosis not present

## 2024-01-26 DIAGNOSIS — Z992 Dependence on renal dialysis: Secondary | ICD-10-CM | POA: Diagnosis not present

## 2024-01-27 DIAGNOSIS — Z992 Dependence on renal dialysis: Secondary | ICD-10-CM | POA: Diagnosis not present

## 2024-01-27 DIAGNOSIS — N186 End stage renal disease: Secondary | ICD-10-CM | POA: Diagnosis not present

## 2024-01-28 ENCOUNTER — Other Ambulatory Visit (HOSPITAL_COMMUNITY): Payer: Self-pay

## 2024-01-28 DIAGNOSIS — E221 Hyperprolactinemia: Secondary | ICD-10-CM | POA: Diagnosis not present

## 2024-01-28 DIAGNOSIS — I120 Hypertensive chronic kidney disease with stage 5 chronic kidney disease or end stage renal disease: Secondary | ICD-10-CM | POA: Diagnosis not present

## 2024-01-28 DIAGNOSIS — N186 End stage renal disease: Secondary | ICD-10-CM | POA: Diagnosis not present

## 2024-01-28 DIAGNOSIS — Z992 Dependence on renal dialysis: Secondary | ICD-10-CM | POA: Diagnosis not present

## 2024-01-28 DIAGNOSIS — E039 Hypothyroidism, unspecified: Secondary | ICD-10-CM | POA: Diagnosis not present

## 2024-01-28 DIAGNOSIS — Z7985 Long-term (current) use of injectable non-insulin antidiabetic drugs: Secondary | ICD-10-CM | POA: Diagnosis not present

## 2024-01-28 DIAGNOSIS — E1122 Type 2 diabetes mellitus with diabetic chronic kidney disease: Secondary | ICD-10-CM | POA: Diagnosis not present

## 2024-01-28 MED ORDER — MOUNJARO 10 MG/0.5ML ~~LOC~~ SOAJ
10.0000 mg | SUBCUTANEOUS | 3 refills | Status: AC
  Filled 2024-01-28: qty 6, 84d supply, fill #0
  Filled 2024-05-05 (×2): qty 6, 84d supply, fill #1
  Filled 2024-07-22: qty 6, 84d supply, fill #2
  Filled 2024-10-18: qty 6, 84d supply, fill #3

## 2024-01-29 DIAGNOSIS — Z992 Dependence on renal dialysis: Secondary | ICD-10-CM | POA: Diagnosis not present

## 2024-01-29 DIAGNOSIS — N186 End stage renal disease: Secondary | ICD-10-CM | POA: Diagnosis not present

## 2024-01-29 DIAGNOSIS — Z23 Encounter for immunization: Secondary | ICD-10-CM | POA: Diagnosis not present

## 2024-02-01 DIAGNOSIS — Z23 Encounter for immunization: Secondary | ICD-10-CM | POA: Diagnosis not present

## 2024-02-01 DIAGNOSIS — Z992 Dependence on renal dialysis: Secondary | ICD-10-CM | POA: Diagnosis not present

## 2024-02-01 DIAGNOSIS — N186 End stage renal disease: Secondary | ICD-10-CM | POA: Diagnosis not present

## 2024-02-02 DIAGNOSIS — Z23 Encounter for immunization: Secondary | ICD-10-CM | POA: Diagnosis not present

## 2024-02-02 DIAGNOSIS — N186 End stage renal disease: Secondary | ICD-10-CM | POA: Diagnosis not present

## 2024-02-02 DIAGNOSIS — R7989 Other specified abnormal findings of blood chemistry: Secondary | ICD-10-CM | POA: Diagnosis not present

## 2024-02-02 DIAGNOSIS — E039 Hypothyroidism, unspecified: Secondary | ICD-10-CM | POA: Diagnosis not present

## 2024-02-02 DIAGNOSIS — E221 Hyperprolactinemia: Secondary | ICD-10-CM | POA: Diagnosis not present

## 2024-02-02 DIAGNOSIS — Z91018 Allergy to other foods: Secondary | ICD-10-CM | POA: Diagnosis not present

## 2024-02-02 DIAGNOSIS — E1122 Type 2 diabetes mellitus with diabetic chronic kidney disease: Secondary | ICD-10-CM | POA: Diagnosis not present

## 2024-02-02 DIAGNOSIS — Z6837 Body mass index (BMI) 37.0-37.9, adult: Secondary | ICD-10-CM | POA: Diagnosis not present

## 2024-02-02 DIAGNOSIS — N184 Chronic kidney disease, stage 4 (severe): Secondary | ICD-10-CM | POA: Diagnosis not present

## 2024-02-02 DIAGNOSIS — Z992 Dependence on renal dialysis: Secondary | ICD-10-CM | POA: Diagnosis not present

## 2024-02-03 DIAGNOSIS — N186 End stage renal disease: Secondary | ICD-10-CM | POA: Diagnosis not present

## 2024-02-03 DIAGNOSIS — Z23 Encounter for immunization: Secondary | ICD-10-CM | POA: Diagnosis not present

## 2024-02-03 DIAGNOSIS — Z992 Dependence on renal dialysis: Secondary | ICD-10-CM | POA: Diagnosis not present

## 2024-02-04 DIAGNOSIS — Z23 Encounter for immunization: Secondary | ICD-10-CM | POA: Diagnosis not present

## 2024-02-04 DIAGNOSIS — N186 End stage renal disease: Secondary | ICD-10-CM | POA: Diagnosis not present

## 2024-02-04 DIAGNOSIS — Z992 Dependence on renal dialysis: Secondary | ICD-10-CM | POA: Diagnosis not present

## 2024-02-05 DIAGNOSIS — N186 End stage renal disease: Secondary | ICD-10-CM | POA: Diagnosis not present

## 2024-02-05 DIAGNOSIS — Z992 Dependence on renal dialysis: Secondary | ICD-10-CM | POA: Diagnosis not present

## 2024-02-07 DIAGNOSIS — Z992 Dependence on renal dialysis: Secondary | ICD-10-CM | POA: Diagnosis not present

## 2024-02-07 DIAGNOSIS — N186 End stage renal disease: Secondary | ICD-10-CM | POA: Diagnosis not present

## 2024-02-08 DIAGNOSIS — N186 End stage renal disease: Secondary | ICD-10-CM | POA: Diagnosis not present

## 2024-02-08 DIAGNOSIS — Z992 Dependence on renal dialysis: Secondary | ICD-10-CM | POA: Diagnosis not present

## 2024-02-10 DIAGNOSIS — Z992 Dependence on renal dialysis: Secondary | ICD-10-CM | POA: Diagnosis not present

## 2024-02-10 DIAGNOSIS — N186 End stage renal disease: Secondary | ICD-10-CM | POA: Diagnosis not present

## 2024-02-12 DIAGNOSIS — Z992 Dependence on renal dialysis: Secondary | ICD-10-CM | POA: Diagnosis not present

## 2024-02-12 DIAGNOSIS — N186 End stage renal disease: Secondary | ICD-10-CM | POA: Diagnosis not present

## 2024-02-14 DIAGNOSIS — N186 End stage renal disease: Secondary | ICD-10-CM | POA: Diagnosis not present

## 2024-02-14 DIAGNOSIS — Z992 Dependence on renal dialysis: Secondary | ICD-10-CM | POA: Diagnosis not present

## 2024-02-15 DIAGNOSIS — N186 End stage renal disease: Secondary | ICD-10-CM | POA: Diagnosis not present

## 2024-02-15 DIAGNOSIS — Z992 Dependence on renal dialysis: Secondary | ICD-10-CM | POA: Diagnosis not present

## 2024-02-17 DIAGNOSIS — Z992 Dependence on renal dialysis: Secondary | ICD-10-CM | POA: Diagnosis not present

## 2024-02-17 DIAGNOSIS — N186 End stage renal disease: Secondary | ICD-10-CM | POA: Diagnosis not present

## 2024-02-19 ENCOUNTER — Telehealth: Payer: Self-pay

## 2024-02-19 DIAGNOSIS — Z992 Dependence on renal dialysis: Secondary | ICD-10-CM | POA: Diagnosis not present

## 2024-02-19 DIAGNOSIS — N186 End stage renal disease: Secondary | ICD-10-CM | POA: Diagnosis not present

## 2024-02-19 NOTE — Telephone Encounter (Signed)
 Medication prior authorization request received.  Completed PA request through cover my meds for drug Silodosin  8 MG. KEY: [id number]  Approved: Pending

## 2024-02-21 DIAGNOSIS — N186 End stage renal disease: Secondary | ICD-10-CM | POA: Diagnosis not present

## 2024-02-21 DIAGNOSIS — Z992 Dependence on renal dialysis: Secondary | ICD-10-CM | POA: Diagnosis not present

## 2024-02-22 DIAGNOSIS — Z992 Dependence on renal dialysis: Secondary | ICD-10-CM | POA: Diagnosis not present

## 2024-02-22 DIAGNOSIS — E1122 Type 2 diabetes mellitus with diabetic chronic kidney disease: Secondary | ICD-10-CM | POA: Diagnosis not present

## 2024-02-22 DIAGNOSIS — N186 End stage renal disease: Secondary | ICD-10-CM | POA: Diagnosis not present

## 2024-02-24 DIAGNOSIS — Z992 Dependence on renal dialysis: Secondary | ICD-10-CM | POA: Diagnosis not present

## 2024-02-24 DIAGNOSIS — N186 End stage renal disease: Secondary | ICD-10-CM | POA: Diagnosis not present

## 2024-02-25 DIAGNOSIS — Z992 Dependence on renal dialysis: Secondary | ICD-10-CM | POA: Diagnosis not present

## 2024-02-25 DIAGNOSIS — N186 End stage renal disease: Secondary | ICD-10-CM | POA: Diagnosis not present

## 2024-02-26 DIAGNOSIS — Z992 Dependence on renal dialysis: Secondary | ICD-10-CM | POA: Diagnosis not present

## 2024-02-26 DIAGNOSIS — N186 End stage renal disease: Secondary | ICD-10-CM | POA: Diagnosis not present

## 2024-02-27 DIAGNOSIS — Z992 Dependence on renal dialysis: Secondary | ICD-10-CM | POA: Diagnosis not present

## 2024-02-27 DIAGNOSIS — N186 End stage renal disease: Secondary | ICD-10-CM | POA: Diagnosis not present

## 2024-02-28 DIAGNOSIS — Z992 Dependence on renal dialysis: Secondary | ICD-10-CM | POA: Diagnosis not present

## 2024-02-28 DIAGNOSIS — N186 End stage renal disease: Secondary | ICD-10-CM | POA: Diagnosis not present

## 2024-02-29 DIAGNOSIS — Z992 Dependence on renal dialysis: Secondary | ICD-10-CM | POA: Diagnosis not present

## 2024-02-29 DIAGNOSIS — N186 End stage renal disease: Secondary | ICD-10-CM | POA: Diagnosis not present

## 2024-03-02 DIAGNOSIS — N186 End stage renal disease: Secondary | ICD-10-CM | POA: Diagnosis not present

## 2024-03-02 DIAGNOSIS — Z992 Dependence on renal dialysis: Secondary | ICD-10-CM | POA: Diagnosis not present

## 2024-03-04 DIAGNOSIS — Z992 Dependence on renal dialysis: Secondary | ICD-10-CM | POA: Diagnosis not present

## 2024-03-04 DIAGNOSIS — N186 End stage renal disease: Secondary | ICD-10-CM | POA: Diagnosis not present

## 2024-03-06 DIAGNOSIS — Z992 Dependence on renal dialysis: Secondary | ICD-10-CM | POA: Diagnosis not present

## 2024-03-06 DIAGNOSIS — N186 End stage renal disease: Secondary | ICD-10-CM | POA: Diagnosis not present

## 2024-03-07 DIAGNOSIS — Z992 Dependence on renal dialysis: Secondary | ICD-10-CM | POA: Diagnosis not present

## 2024-03-07 DIAGNOSIS — N186 End stage renal disease: Secondary | ICD-10-CM | POA: Diagnosis not present

## 2024-03-09 DIAGNOSIS — N186 End stage renal disease: Secondary | ICD-10-CM | POA: Diagnosis not present

## 2024-03-09 DIAGNOSIS — Z992 Dependence on renal dialysis: Secondary | ICD-10-CM | POA: Diagnosis not present

## 2024-03-11 DIAGNOSIS — Z992 Dependence on renal dialysis: Secondary | ICD-10-CM | POA: Diagnosis not present

## 2024-03-11 DIAGNOSIS — N186 End stage renal disease: Secondary | ICD-10-CM | POA: Diagnosis not present

## 2024-03-13 DIAGNOSIS — N186 End stage renal disease: Secondary | ICD-10-CM | POA: Diagnosis not present

## 2024-03-13 DIAGNOSIS — Z992 Dependence on renal dialysis: Secondary | ICD-10-CM | POA: Diagnosis not present

## 2024-03-14 DIAGNOSIS — N186 End stage renal disease: Secondary | ICD-10-CM | POA: Diagnosis not present

## 2024-03-14 DIAGNOSIS — Z992 Dependence on renal dialysis: Secondary | ICD-10-CM | POA: Diagnosis not present

## 2024-03-16 ENCOUNTER — Ambulatory Visit: Admitting: Urology

## 2024-03-16 DIAGNOSIS — Z992 Dependence on renal dialysis: Secondary | ICD-10-CM | POA: Diagnosis not present

## 2024-03-16 DIAGNOSIS — N186 End stage renal disease: Secondary | ICD-10-CM | POA: Diagnosis not present

## 2024-03-18 DIAGNOSIS — Z992 Dependence on renal dialysis: Secondary | ICD-10-CM | POA: Diagnosis not present

## 2024-03-18 DIAGNOSIS — N186 End stage renal disease: Secondary | ICD-10-CM | POA: Diagnosis not present

## 2024-03-20 DIAGNOSIS — N186 End stage renal disease: Secondary | ICD-10-CM | POA: Diagnosis not present

## 2024-03-20 DIAGNOSIS — Z992 Dependence on renal dialysis: Secondary | ICD-10-CM | POA: Diagnosis not present

## 2024-03-21 DIAGNOSIS — N186 End stage renal disease: Secondary | ICD-10-CM | POA: Diagnosis not present

## 2024-03-21 DIAGNOSIS — Z992 Dependence on renal dialysis: Secondary | ICD-10-CM | POA: Diagnosis not present

## 2024-03-23 DIAGNOSIS — N186 End stage renal disease: Secondary | ICD-10-CM | POA: Diagnosis not present

## 2024-03-23 DIAGNOSIS — Z992 Dependence on renal dialysis: Secondary | ICD-10-CM | POA: Diagnosis not present

## 2024-03-25 DIAGNOSIS — Z992 Dependence on renal dialysis: Secondary | ICD-10-CM | POA: Diagnosis not present

## 2024-03-25 DIAGNOSIS — N186 End stage renal disease: Secondary | ICD-10-CM | POA: Diagnosis not present

## 2024-03-27 DIAGNOSIS — Z23 Encounter for immunization: Secondary | ICD-10-CM | POA: Diagnosis not present

## 2024-03-27 DIAGNOSIS — Z992 Dependence on renal dialysis: Secondary | ICD-10-CM | POA: Diagnosis not present

## 2024-03-27 DIAGNOSIS — N186 End stage renal disease: Secondary | ICD-10-CM | POA: Diagnosis not present

## 2024-03-28 DIAGNOSIS — Z23 Encounter for immunization: Secondary | ICD-10-CM | POA: Diagnosis not present

## 2024-03-28 DIAGNOSIS — Z992 Dependence on renal dialysis: Secondary | ICD-10-CM | POA: Diagnosis not present

## 2024-03-28 DIAGNOSIS — N186 End stage renal disease: Secondary | ICD-10-CM | POA: Diagnosis not present

## 2024-03-30 DIAGNOSIS — Z992 Dependence on renal dialysis: Secondary | ICD-10-CM | POA: Diagnosis not present

## 2024-03-30 DIAGNOSIS — Z23 Encounter for immunization: Secondary | ICD-10-CM | POA: Diagnosis not present

## 2024-03-30 DIAGNOSIS — N186 End stage renal disease: Secondary | ICD-10-CM | POA: Diagnosis not present

## 2024-04-01 DIAGNOSIS — Z23 Encounter for immunization: Secondary | ICD-10-CM | POA: Diagnosis not present

## 2024-04-01 DIAGNOSIS — Z992 Dependence on renal dialysis: Secondary | ICD-10-CM | POA: Diagnosis not present

## 2024-04-01 DIAGNOSIS — N186 End stage renal disease: Secondary | ICD-10-CM | POA: Diagnosis not present

## 2024-04-03 DIAGNOSIS — Z992 Dependence on renal dialysis: Secondary | ICD-10-CM | POA: Diagnosis not present

## 2024-04-03 DIAGNOSIS — Z23 Encounter for immunization: Secondary | ICD-10-CM | POA: Diagnosis not present

## 2024-04-03 DIAGNOSIS — N186 End stage renal disease: Secondary | ICD-10-CM | POA: Diagnosis not present

## 2024-04-04 ENCOUNTER — Emergency Department (HOSPITAL_COMMUNITY)
Admission: EM | Admit: 2024-04-04 | Discharge: 2024-04-04 | Disposition: A | Discharge status: Home / Self Care | Attending: Emergency Medicine | Admitting: Emergency Medicine

## 2024-04-04 ENCOUNTER — Encounter (HOSPITAL_COMMUNITY): Payer: Self-pay | Admitting: *Deleted

## 2024-04-04 ENCOUNTER — Other Ambulatory Visit: Payer: Self-pay

## 2024-04-04 DIAGNOSIS — I2510 Atherosclerotic heart disease of native coronary artery without angina pectoris: Secondary | ICD-10-CM | POA: Diagnosis not present

## 2024-04-04 DIAGNOSIS — N186 End stage renal disease: Secondary | ICD-10-CM | POA: Diagnosis not present

## 2024-04-04 DIAGNOSIS — Z79899 Other long term (current) drug therapy: Secondary | ICD-10-CM | POA: Insufficient documentation

## 2024-04-04 DIAGNOSIS — I130 Hypertensive heart and chronic kidney disease with heart failure and stage 1 through stage 4 chronic kidney disease, or unspecified chronic kidney disease: Secondary | ICD-10-CM | POA: Diagnosis not present

## 2024-04-04 DIAGNOSIS — I5030 Unspecified diastolic (congestive) heart failure: Secondary | ICD-10-CM | POA: Insufficient documentation

## 2024-04-04 DIAGNOSIS — R002 Palpitations: Secondary | ICD-10-CM

## 2024-04-04 DIAGNOSIS — N189 Chronic kidney disease, unspecified: Secondary | ICD-10-CM | POA: Insufficient documentation

## 2024-04-04 DIAGNOSIS — E039 Hypothyroidism, unspecified: Secondary | ICD-10-CM | POA: Insufficient documentation

## 2024-04-04 DIAGNOSIS — Z7982 Long term (current) use of aspirin: Secondary | ICD-10-CM | POA: Diagnosis not present

## 2024-04-04 DIAGNOSIS — Z992 Dependence on renal dialysis: Secondary | ICD-10-CM | POA: Diagnosis not present

## 2024-04-04 DIAGNOSIS — R000 Tachycardia, unspecified: Secondary | ICD-10-CM | POA: Diagnosis not present

## 2024-04-04 DIAGNOSIS — Z23 Encounter for immunization: Secondary | ICD-10-CM | POA: Diagnosis not present

## 2024-04-04 LAB — COMPREHENSIVE METABOLIC PANEL WITH GFR
ALT: 21 U/L (ref 0–44)
AST: 16 U/L (ref 15–41)
Albumin: 3.1 g/dL — ABNORMAL LOW (ref 3.5–5.0)
Alkaline Phosphatase: 61 U/L (ref 38–126)
Anion gap: 12 (ref 5–15)
BUN: 32 mg/dL — ABNORMAL HIGH (ref 8–23)
CO2: 20 mmol/L — ABNORMAL LOW (ref 22–32)
Calcium: 8.7 mg/dL — ABNORMAL LOW (ref 8.9–10.3)
Chloride: 107 mmol/L (ref 98–111)
Creatinine, Ser: 4.44 mg/dL — ABNORMAL HIGH (ref 0.61–1.24)
GFR, Estimated: 13 mL/min — ABNORMAL LOW (ref >60)
Glucose, Bld: 127 mg/dL — ABNORMAL HIGH (ref 70–99)
Potassium: 4.7 mmol/L (ref 3.5–5.1)
Sodium: 139 mmol/L (ref 135–145)
Total Bilirubin: <0.2 mg/dL (ref 0.0–1.2)
Total Protein: 6.7 g/dL (ref 6.5–8.1)

## 2024-04-04 LAB — CBC WITH DIFFERENTIAL/PLATELET
Abs Immature Granulocytes: 0.06 10*3/uL (ref 0.00–0.07)
Basophils Absolute: 0.1 10*3/uL (ref 0.0–0.1)
Basophils Relative: 1 %
Eosinophils Absolute: 0.3 10*3/uL (ref 0.0–0.5)
Eosinophils Relative: 2 %
HCT: 35.8 % — ABNORMAL LOW (ref 39.0–52.0)
Hemoglobin: 11.6 g/dL — ABNORMAL LOW (ref 13.0–17.0)
Immature Granulocytes: 1 %
Lymphocytes Relative: 15 %
Lymphs Abs: 1.8 10*3/uL (ref 0.7–4.0)
MCH: 29.4 pg (ref 26.0–34.0)
MCHC: 32.4 g/dL (ref 30.0–36.0)
MCV: 90.9 fL (ref 80.0–100.0)
Monocytes Absolute: 0.9 10*3/uL (ref 0.1–1.0)
Monocytes Relative: 7 %
Neutro Abs: 9.4 10*3/uL — ABNORMAL HIGH (ref 1.7–7.7)
Neutrophils Relative %: 74 %
Platelets: 241 10*3/uL (ref 150–400)
RBC: 3.94 MIL/uL — ABNORMAL LOW (ref 4.22–5.81)
RDW: 14.6 % (ref 11.5–15.5)
WBC: 12.6 10*3/uL — ABNORMAL HIGH (ref 4.0–10.5)
nRBC: 0 % (ref 0.0–0.2)

## 2024-04-04 LAB — MAGNESIUM: Magnesium: 2.1 mg/dL (ref 1.7–2.4)

## 2024-04-04 NOTE — ED Triage Notes (Signed)
 Pt BIB [facility] from home for tachycardia (130-140), pt took extra 1/2 dose of carvedilol  prior to EMS arrival, upon arrival HR 80s, 129/86, RR17, 98% RA, pt is dialysis pt, has missed no appts

## 2024-04-04 NOTE — Discharge Instructions (Signed)
 Thank you for coming to [facility] Emergency Department. You were seen for tachycardia. We did an exam, labs, and imaging, and these showed no acute findings.  You were observed in the emergency department for 2 hours with no recurrence of your symptoms.  Please continue to take your medications as prescribed and perform your peritoneal dialysis as prescribed. Please follow up with your cardiologist within 1 to 2 weeks.  Do not hesitate to return to the ED or call 911 if you experience: -Worsening symptoms -Tachycardia or palpitations at persist despite taking your rescue carvedilol  - Tachycardia or palpitations accompanied by chest pain, nausea vomiting, sweating, shortness of breath, lightheadedness or passing out -Lightheadedness, passing out -Fevers/chills -Anything else that concerns you

## 2024-04-04 NOTE — ED Notes (Signed)
 ED Provider at bedside.

## 2024-04-04 NOTE — ED Provider Notes (Signed)
 [facility] EMERGENCY DEPARTMENT AT [facility] Provider Note   CSN: [id number] Arrival date & time: 04/04/24  1236     History  Chief Complaint  Patient presents with   Tachycardia    ZYLEN WENIG is a very pleasant 73 y.o. male with PMH as listed below who presents BIB [facility] from home for tachycardia (~150 bpm) that lasted ~1 hour. Has episodes normally 1-2 times per month but they last only 30 seconds - 3 minutes. He has previously been diagnosed w/ supraventricular tachycardia by monitor with Dr. Amanda Jungling and has PRN 1/2 dose carvedilol  to take for that. Had been told by Dr. Amanda Jungling that if the symptoms lasted longer than 45 minutes to come to the ED. He did take his carvedilol  and as soon as the ambulance came and he got into the ambulance, his tachycardia ceased. He feels well now. Denies chest pain, N/V, diaphoresis, leg swelling. Recent travel, hospitalizations/surgeries. Does peritoneal dialysis nightly, hasn't missed any.   Past Medical History:  Diagnosis Date   Anemia    Arthritis    Back pain    BPH (benign prostatic hyperplasia)    CAD S/P percutaneous coronary angioplasty    a. 2005: PCI OM - Zomax study stent DES  2.5 x16 mm. b. cath in 02/2018 showing 20% ISR of LCx stent and 20% stenosis along mid-distal LM.    CKD (chronic kidney disease)    Controlled type 2 diabetes with neuropathy (HCC)    Diastolic heart failure (HCC)    Grade 1   Edema of both lower extremities    Food allergy    Gout    Hypercholesterolemia    Hypertension    Hypothyroidism    Kidney disease    MI (myocardial infarction) (HCC)    in his 43s   Sleep apnea    SOB (shortness of breath)        Home Medications Prior to Admission medications   Medication Sig Start Date End Date Taking? Authorizing Provider  acetaminophen  (TYLENOL ) 500 MG tablet Take 500 mg by mouth 2 (two) times daily as needed (for pain.).    [provider]  ALPRAZolam  (XANAX ) 0.5 MG tablet Take  0.5 mg by mouth daily as needed for anxiety. Patient not taking: Reported on 12/04/2023    [provider]  aspirin  EC 81 MG tablet Take 81 mg by mouth every evening.    [provider]  calcitRIOL (ROCALTROL) 0.25 MCG capsule Take 0.25 mcg by mouth 3 (three) times a week. 11/16/23   [provider]  carvedilol  (COREG ) 25 MG tablet TAKE 1 AND 1/2 TABLETS TWICE DAILY 12/14/23   Laurann Pollock, MD  EPINEPHrine  0.3 mg/0.3 mL IJ SOAJ injection Inject 0.3 mg into the muscle as needed for anaphylaxis.    [provider]  Febuxostat  80 MG TABS TAKE ONE TABLET (80 MG TOTAL) BY MOUTH DAILY. 03/26/22   Rice, Haig Levan, MD  Ferrous Sulfate (FEROSUL PO) Take by mouth.    [provider]  finasteride  (PROSCAR ) 5 MG tablet Take 1 tablet (5 mg total) by mouth daily. 03/09/23   McKenzie, Arden Beck, MD  furosemide  (LASIX ) 20 MG tablet Take 40 mg by mouth 2 (two) times daily. 08/28/22   [provider]  hydrALAZINE  (APRESOLINE ) 100 MG tablet TAKE ONE TABLET (100MG  TOTAL) BY MOUTH THREE TIMES DAILY 01/19/23   Laurann Pollock, MD  levothyroxine  (SYNTHROID ) 25 MCG tablet Take 25 mcg by mouth daily before breakfast.  Take with 200 mg    [provider]  MITIGARE 0.6 MG CAPS Take 0.6 mg by mouth 2 (two) times daily as needed (gout flare). 11/29/20   [provider]  omega-3 acid ethyl esters (LOVAZA ) 1 g capsule TAKE ONE CAPSULE BY MOUTH EVERY 12 HOURS 05/02/19   Nida, Gebreselassie W, MD  oxyCODONE -acetaminophen  (PERCOCET) 5-325 MG tablet Take 1 tablet by mouth every 6 (six) hours as needed. 08/13/23   Rhyne, Maryanna Smart, PA-C  PARoxetine (PAXIL) 10 MG tablet Take 10 mg by mouth daily. 11/19/23   [provider]  rosuvastatin  (CRESTOR ) 5 MG tablet Take 5 mg by mouth daily. 02/25/22   [provider]  silodosin  (RAPAFLO ) 8 MG CAPS capsule Take 1 capsule (8 mg total) by mouth in the morning and at bedtime. 01/18/24   McKenzie, Arden Beck, MD   sodium bicarbonate 650 MG tablet Take 650 mg by mouth 3 (three) times daily. 09/04/21   [provider]  tirzepatide  (MOUNJARO ) 10 MG/0.5ML Pen Inject 10 mg into the skin once a week. 07/27/23     tirzepatide  (MOUNJARO ) 10 MG/0.5ML Pen Inject 10 mg into the skin once a week. 01/28/24         Allergies    Meat extract, Lipitor [atorvastatin], Simvastatin, Allopurinol , and Alpha-gal    Review of Systems   Review of Systems A 10 point review of systems was performed and is negative unless otherwise reported in HPI.  Physical Exam Updated Vital Signs BP 101/81   Pulse 76   Temp 98.2 F (36.8 C) (Oral)   Resp 17   Ht 5\' 10"  (1.778 m)   Wt 100.7 kg   SpO2 95%   BMI 31.85 kg/m  Physical Exam General: Normal appearing male, lying in bed.  HEENT: PERRLA, Sclera anicteric, MMM, trachea midline.  Cardiology: RRR, no murmurs/rubs/gallops.  Resp: Normal respiratory rate and effort. CTAB, no wheezes, rhonchi, crackles.  Abd: Soft, non-tender, non-distended. No rebound tenderness or guarding. PD catheter in abdomen with no surrounding erythema/induration/fluctuance or TTP.  GU: Deferred. MSK: No peripheral edema or signs of trauma. Extremities without deformity or TTP. No cyanosis or clubbing. Skin: warm, dry.  Neuro: A&Ox4, CNs II-XII grossly intact. MAEs. Sensation grossly intact.  Psych: Normal mood and affect.   ED Results / Procedures / Treatments   Labs (all labs ordered are listed, but only abnormal results are displayed) Labs Reviewed  CBC WITH DIFFERENTIAL/PLATELET - Abnormal; Notable for the following components:      Result Value   WBC 12.6 (*)    RBC 3.94 (*)    Hemoglobin 11.6 (*)    HCT 35.8 (*)    Neutro Abs 9.4 (*)    All other components within normal limits  COMPREHENSIVE METABOLIC PANEL WITH GFR - Abnormal; Notable for the following components:   CO2 20 (*)    Glucose, Bld 127 (*)    BUN 32 (*)    Creatinine, Ser 4.44 (*)    Calcium  8.7 (*)    Albumin  3.1 (*)    GFR, Estimated 13 (*)    All other components within normal limits  MAGNESIUM    EKG EKG Interpretation Date/Time:  Monday April 04 2024 12:58:39 EDT Ventricular Rate:  79 PR Interval:  175 QRS Duration:  96 QT Interval:  377 QTC Calculation: 433 R Axis:   4  Text Interpretation: Sinus rhythm Low voltage, precordial leads Confirmed by Annita Kindle [id number] on 04/04/2024 1:33:20 PM  Radiology No results  found.  Procedures Procedures    Medications Ordered in ED Medications - No data to display  ED Course/ Medical Decision Making/ A&P                          Medical Decision Making Amount and/or Complexity of Data Reviewed Labs: ordered.    This patient presents to the ED for concern of tachycardia, this involves an extensive number of treatment options, and is a complaint that carries with it a high risk of complications and morbidity.  I considered the following differential and admission for this acute, potentially life threatening condition. Patient is HDS on arrival, sxs have resolved, very well-appearing.  MDM:    Pt w/ h/o tachycardia. He had no sxs now and his sxs resolved 20-30 minutes after taking his PRN carvedilol . Since the sxs lasted so long he called the ambulance like he was instructed by cardiologist. He has no significant electrolyte derangements. 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 Has mild leukocytosis but per chart review and on discussion with the patient he seems to have had this recently and he has no f/c or focal infectious sxs. Patient is observed for >2 hours in the ED with no recurrence of tachycardia. Discussed w/ patient to return to ED if his tachycardia returns and does not go away with his PRN carevedilol or if it is associated w/ sxs such as CP, SOB, lightheadedness/syncope, diaphoresis. Hasn't seen [facility] since 2023 so advised f/u within 1-2 weeks w/ cardiology. DC w/ discharge instructions/return precautions. All questions answered to  patient's satisfaction.        Labs: I Ordered, and personally interpreted labs.  The pertinent results include:  those listed above  Additional history obtained from chart review, wife at bedside.    Cardiac Monitoring: The patient was maintained on a cardiac monitor.  I personally viewed and interpreted the cardiac monitored which showed an underlying rhythm of: NSR  Reevaluation:  I reevaluated the patient and found that they have :resolved  Social Determinants of Health: lives independently  Disposition:  DC w/ discharge instructions/return precautions. All questions answered to patient's satisfaction.    Co morbidities that complicate the patient evaluation  Past Medical History:  Diagnosis Date   Anemia    Arthritis    Back pain    BPH (benign prostatic hyperplasia)    CAD S/P percutaneous coronary angioplasty    a. 2005: PCI OM - Zomax study stent DES  2.5 x16 mm. b. cath in 02/2018 showing 20% ISR of LCx stent and 20% stenosis along mid-distal LM.    CKD (chronic kidney disease)    Controlled type 2 diabetes with neuropathy (HCC)    Diastolic heart failure (HCC)    Grade 1   Edema of both lower extremities    Food allergy    Gout    Hypercholesterolemia    Hypertension    Hypothyroidism    Kidney disease    MI (myocardial infarction) (HCC)    in his 63s   Sleep apnea    SOB (shortness of breath)      Medicines No orders of the defined types were placed in this encounter.   I have reviewed the patients home medicines and have made adjustments as needed  Problem List / ED Course: Problem List Items Addressed This Visit   None Visit Diagnoses       Palpitations    -  Primary  This note was created using dictation software, which may contain spelling or grammatical errors.    Merdis Stalling, MD 04/04/24 [phone]

## 2024-04-06 DIAGNOSIS — Z992 Dependence on renal dialysis: Secondary | ICD-10-CM | POA: Diagnosis not present

## 2024-04-06 DIAGNOSIS — Z23 Encounter for immunization: Secondary | ICD-10-CM | POA: Diagnosis not present

## 2024-04-06 DIAGNOSIS — N186 End stage renal disease: Secondary | ICD-10-CM | POA: Diagnosis not present

## 2024-04-08 DIAGNOSIS — Z23 Encounter for immunization: Secondary | ICD-10-CM | POA: Diagnosis not present

## 2024-04-08 DIAGNOSIS — N186 End stage renal disease: Secondary | ICD-10-CM | POA: Diagnosis not present

## 2024-04-08 DIAGNOSIS — Z992 Dependence on renal dialysis: Secondary | ICD-10-CM | POA: Diagnosis not present

## 2024-04-10 DIAGNOSIS — N186 End stage renal disease: Secondary | ICD-10-CM | POA: Diagnosis not present

## 2024-04-10 DIAGNOSIS — Z992 Dependence on renal dialysis: Secondary | ICD-10-CM | POA: Diagnosis not present

## 2024-04-10 DIAGNOSIS — Z23 Encounter for immunization: Secondary | ICD-10-CM | POA: Diagnosis not present

## 2024-04-11 DIAGNOSIS — Z23 Encounter for immunization: Secondary | ICD-10-CM | POA: Diagnosis not present

## 2024-04-11 DIAGNOSIS — N186 End stage renal disease: Secondary | ICD-10-CM | POA: Diagnosis not present

## 2024-04-11 DIAGNOSIS — Z992 Dependence on renal dialysis: Secondary | ICD-10-CM | POA: Diagnosis not present

## 2024-04-13 DIAGNOSIS — Z992 Dependence on renal dialysis: Secondary | ICD-10-CM | POA: Diagnosis not present

## 2024-04-13 DIAGNOSIS — Z23 Encounter for immunization: Secondary | ICD-10-CM | POA: Diagnosis not present

## 2024-04-13 DIAGNOSIS — N186 End stage renal disease: Secondary | ICD-10-CM | POA: Diagnosis not present

## 2024-04-15 DIAGNOSIS — Z992 Dependence on renal dialysis: Secondary | ICD-10-CM | POA: Diagnosis not present

## 2024-04-15 DIAGNOSIS — Z23 Encounter for immunization: Secondary | ICD-10-CM | POA: Diagnosis not present

## 2024-04-15 DIAGNOSIS — N186 End stage renal disease: Secondary | ICD-10-CM | POA: Diagnosis not present

## 2024-04-17 DIAGNOSIS — Z23 Encounter for immunization: Secondary | ICD-10-CM | POA: Diagnosis not present

## 2024-04-17 DIAGNOSIS — N186 End stage renal disease: Secondary | ICD-10-CM | POA: Diagnosis not present

## 2024-04-17 DIAGNOSIS — Z992 Dependence on renal dialysis: Secondary | ICD-10-CM | POA: Diagnosis not present

## 2024-04-18 DIAGNOSIS — N186 End stage renal disease: Secondary | ICD-10-CM | POA: Diagnosis not present

## 2024-04-18 DIAGNOSIS — Z23 Encounter for immunization: Secondary | ICD-10-CM | POA: Diagnosis not present

## 2024-04-18 DIAGNOSIS — Z992 Dependence on renal dialysis: Secondary | ICD-10-CM | POA: Diagnosis not present

## 2024-04-20 DIAGNOSIS — N186 End stage renal disease: Secondary | ICD-10-CM | POA: Diagnosis not present

## 2024-04-20 DIAGNOSIS — Z992 Dependence on renal dialysis: Secondary | ICD-10-CM | POA: Diagnosis not present

## 2024-04-20 DIAGNOSIS — Z23 Encounter for immunization: Secondary | ICD-10-CM | POA: Diagnosis not present

## 2024-04-22 DIAGNOSIS — Z992 Dependence on renal dialysis: Secondary | ICD-10-CM | POA: Diagnosis not present

## 2024-04-22 DIAGNOSIS — N186 End stage renal disease: Secondary | ICD-10-CM | POA: Diagnosis not present

## 2024-04-22 DIAGNOSIS — Z23 Encounter for immunization: Secondary | ICD-10-CM | POA: Diagnosis not present

## 2024-04-24 DIAGNOSIS — Z992 Dependence on renal dialysis: Secondary | ICD-10-CM | POA: Diagnosis not present

## 2024-04-24 DIAGNOSIS — N186 End stage renal disease: Secondary | ICD-10-CM | POA: Diagnosis not present

## 2024-04-24 DIAGNOSIS — Z23 Encounter for immunization: Secondary | ICD-10-CM | POA: Diagnosis not present

## 2024-04-25 DIAGNOSIS — Z23 Encounter for immunization: Secondary | ICD-10-CM | POA: Diagnosis not present

## 2024-04-25 DIAGNOSIS — Z992 Dependence on renal dialysis: Secondary | ICD-10-CM | POA: Diagnosis not present

## 2024-04-25 DIAGNOSIS — N186 End stage renal disease: Secondary | ICD-10-CM | POA: Diagnosis not present

## 2024-04-26 DIAGNOSIS — E1122 Type 2 diabetes mellitus with diabetic chronic kidney disease: Secondary | ICD-10-CM | POA: Diagnosis not present

## 2024-04-26 DIAGNOSIS — N186 End stage renal disease: Secondary | ICD-10-CM | POA: Diagnosis not present

## 2024-04-26 DIAGNOSIS — Z992 Dependence on renal dialysis: Secondary | ICD-10-CM | POA: Diagnosis not present

## 2024-04-27 DIAGNOSIS — N186 End stage renal disease: Secondary | ICD-10-CM | POA: Diagnosis not present

## 2024-04-27 DIAGNOSIS — Z992 Dependence on renal dialysis: Secondary | ICD-10-CM | POA: Diagnosis not present

## 2024-04-29 DIAGNOSIS — N186 End stage renal disease: Secondary | ICD-10-CM | POA: Diagnosis not present

## 2024-04-29 DIAGNOSIS — Z992 Dependence on renal dialysis: Secondary | ICD-10-CM | POA: Diagnosis not present

## 2024-05-01 DIAGNOSIS — N186 End stage renal disease: Secondary | ICD-10-CM | POA: Diagnosis not present

## 2024-05-01 DIAGNOSIS — Z992 Dependence on renal dialysis: Secondary | ICD-10-CM | POA: Diagnosis not present

## 2024-05-02 DIAGNOSIS — Z992 Dependence on renal dialysis: Secondary | ICD-10-CM | POA: Diagnosis not present

## 2024-05-02 DIAGNOSIS — N186 End stage renal disease: Secondary | ICD-10-CM | POA: Diagnosis not present

## 2024-05-04 DIAGNOSIS — N186 End stage renal disease: Secondary | ICD-10-CM | POA: Diagnosis not present

## 2024-05-04 DIAGNOSIS — Z992 Dependence on renal dialysis: Secondary | ICD-10-CM | POA: Diagnosis not present

## 2024-05-05 ENCOUNTER — Other Ambulatory Visit (HOSPITAL_COMMUNITY): Payer: Self-pay

## 2024-05-05 ENCOUNTER — Other Ambulatory Visit (HOSPITAL_BASED_OUTPATIENT_CLINIC_OR_DEPARTMENT_OTHER): Payer: Self-pay

## 2024-05-06 DIAGNOSIS — N186 End stage renal disease: Secondary | ICD-10-CM | POA: Diagnosis not present

## 2024-05-06 DIAGNOSIS — Z992 Dependence on renal dialysis: Secondary | ICD-10-CM | POA: Diagnosis not present

## 2024-05-08 DIAGNOSIS — N186 End stage renal disease: Secondary | ICD-10-CM | POA: Diagnosis not present

## 2024-05-08 DIAGNOSIS — Z992 Dependence on renal dialysis: Secondary | ICD-10-CM | POA: Diagnosis not present

## 2024-05-09 ENCOUNTER — Other Ambulatory Visit: Payer: Self-pay | Admitting: Cardiology

## 2024-05-09 DIAGNOSIS — Z992 Dependence on renal dialysis: Secondary | ICD-10-CM | POA: Diagnosis not present

## 2024-05-09 DIAGNOSIS — N186 End stage renal disease: Secondary | ICD-10-CM | POA: Diagnosis not present

## 2024-05-11 DIAGNOSIS — N186 End stage renal disease: Secondary | ICD-10-CM | POA: Diagnosis not present

## 2024-05-11 DIAGNOSIS — Z992 Dependence on renal dialysis: Secondary | ICD-10-CM | POA: Diagnosis not present

## 2024-05-13 DIAGNOSIS — Z992 Dependence on renal dialysis: Secondary | ICD-10-CM | POA: Diagnosis not present

## 2024-05-13 DIAGNOSIS — N186 End stage renal disease: Secondary | ICD-10-CM | POA: Diagnosis not present

## 2024-05-15 DIAGNOSIS — Z992 Dependence on renal dialysis: Secondary | ICD-10-CM | POA: Diagnosis not present

## 2024-05-15 DIAGNOSIS — N186 End stage renal disease: Secondary | ICD-10-CM | POA: Diagnosis not present

## 2024-05-16 DIAGNOSIS — N186 End stage renal disease: Secondary | ICD-10-CM | POA: Diagnosis not present

## 2024-05-16 DIAGNOSIS — Z992 Dependence on renal dialysis: Secondary | ICD-10-CM | POA: Diagnosis not present

## 2024-05-18 DIAGNOSIS — N186 End stage renal disease: Secondary | ICD-10-CM | POA: Diagnosis not present

## 2024-05-18 DIAGNOSIS — Z992 Dependence on renal dialysis: Secondary | ICD-10-CM | POA: Diagnosis not present

## 2024-05-20 DIAGNOSIS — Z992 Dependence on renal dialysis: Secondary | ICD-10-CM | POA: Diagnosis not present

## 2024-05-20 DIAGNOSIS — N186 End stage renal disease: Secondary | ICD-10-CM | POA: Diagnosis not present

## 2024-05-22 DIAGNOSIS — N186 End stage renal disease: Secondary | ICD-10-CM | POA: Diagnosis not present

## 2024-05-22 DIAGNOSIS — Z992 Dependence on renal dialysis: Secondary | ICD-10-CM | POA: Diagnosis not present

## 2024-05-23 DIAGNOSIS — N186 End stage renal disease: Secondary | ICD-10-CM | POA: Diagnosis not present

## 2024-05-23 DIAGNOSIS — Z992 Dependence on renal dialysis: Secondary | ICD-10-CM | POA: Diagnosis not present

## 2024-05-25 DIAGNOSIS — Z992 Dependence on renal dialysis: Secondary | ICD-10-CM | POA: Diagnosis not present

## 2024-05-25 DIAGNOSIS — N186 End stage renal disease: Secondary | ICD-10-CM | POA: Diagnosis not present

## 2024-05-27 DIAGNOSIS — Z23 Encounter for immunization: Secondary | ICD-10-CM | POA: Diagnosis not present

## 2024-05-27 DIAGNOSIS — Z992 Dependence on renal dialysis: Secondary | ICD-10-CM | POA: Diagnosis not present

## 2024-05-27 DIAGNOSIS — N186 End stage renal disease: Secondary | ICD-10-CM | POA: Diagnosis not present

## 2024-05-29 DIAGNOSIS — Z23 Encounter for immunization: Secondary | ICD-10-CM | POA: Diagnosis not present

## 2024-05-29 DIAGNOSIS — N186 End stage renal disease: Secondary | ICD-10-CM | POA: Diagnosis not present

## 2024-05-29 DIAGNOSIS — Z992 Dependence on renal dialysis: Secondary | ICD-10-CM | POA: Diagnosis not present

## 2024-05-30 DIAGNOSIS — Z992 Dependence on renal dialysis: Secondary | ICD-10-CM | POA: Diagnosis not present

## 2024-05-30 DIAGNOSIS — N186 End stage renal disease: Secondary | ICD-10-CM | POA: Diagnosis not present

## 2024-05-30 DIAGNOSIS — Z23 Encounter for immunization: Secondary | ICD-10-CM | POA: Diagnosis not present

## 2024-06-01 DIAGNOSIS — Z23 Encounter for immunization: Secondary | ICD-10-CM | POA: Diagnosis not present

## 2024-06-01 DIAGNOSIS — N186 End stage renal disease: Secondary | ICD-10-CM | POA: Diagnosis not present

## 2024-06-01 DIAGNOSIS — Z992 Dependence on renal dialysis: Secondary | ICD-10-CM | POA: Diagnosis not present

## 2024-06-02 ENCOUNTER — Telehealth: Payer: Self-pay | Admitting: Nurse Practitioner

## 2024-06-02 ENCOUNTER — Ambulatory Visit: Attending: Nurse Practitioner | Admitting: Nurse Practitioner

## 2024-06-02 VITALS — BP 128/72 | HR 62 | Ht 70.0 in | Wt 229.2 lb

## 2024-06-02 DIAGNOSIS — Z992 Dependence on renal dialysis: Secondary | ICD-10-CM | POA: Diagnosis not present

## 2024-06-02 DIAGNOSIS — N186 End stage renal disease: Secondary | ICD-10-CM | POA: Diagnosis not present

## 2024-06-02 DIAGNOSIS — I471 Supraventricular tachycardia, unspecified: Secondary | ICD-10-CM | POA: Diagnosis not present

## 2024-06-02 DIAGNOSIS — I2510 Atherosclerotic heart disease of native coronary artery without angina pectoris: Secondary | ICD-10-CM

## 2024-06-02 DIAGNOSIS — I10 Essential (primary) hypertension: Secondary | ICD-10-CM

## 2024-06-02 DIAGNOSIS — R002 Palpitations: Secondary | ICD-10-CM

## 2024-06-02 DIAGNOSIS — E785 Hyperlipidemia, unspecified: Secondary | ICD-10-CM

## 2024-06-02 NOTE — Telephone Encounter (Signed)
 Checking percert on the following    30 day preventice monitor - svt

## 2024-06-02 NOTE — Patient Instructions (Addendum)
Medication Instructions:  Continue all current medications.  Labwork: none  Testing/Procedures: Your physician has recommended that you wear a 30 day event monitor. Event monitors are medical devices that record the heart's electrical activity. Doctors most often us these monitors to diagnose arrhythmias. Arrhythmias are problems with the speed or rhythm of the heartbeat. The monitor is a small, portable device. You can wear one while you do your normal daily activities. This is usually used to diagnose what is causing palpitations/syncope (passing out).  Follow-Up: 6-8 weeks   Any Other Special Instructions Will Be Listed Below (If Applicable).  If you need a refill on your cardiac medications before your next appointment, please call your pharmacy.

## 2024-06-02 NOTE — Progress Notes (Signed)
 Cardiology Office Note   Date:  06/02/2024 ID:  Brandon Warren, Brandon Warren 03-16-1951, MRN [id number] PCP: [facility] Providers Cardiologist:  Alvan Carrier, MD     History of Present Illness Brandon Warren is a 73 y.o. male with a PMH of CAD, DOE, hyperlipidemia, hypertension, SVT, PVCs, palpitations, ESRD on PD, who presents today for ED follow-up.  Previous cardiovascular history of drug-eluting stent placed to OM in 2005.  Heart catheterization in 2019 showed patent stent.  Echocardiogram from 2024 revealed normal LVEF with no regional WMA.  Last seen by Brandon Qua, PA-C on December 04, 2023.  At that time, he was planning to undergo peritoneal dialysis catheter placement as his fistula did not take.  He had lost 30 pounds in the past few months due to dietary changes but also due to needing dentures.  Reported having been hypotensive at times, but overall doing well.  ED visit on April 04, 2024 at [facility] due to tachycardia.  Heart rate around 150 bpm, lasted 1 hour.  He reported episodes happen around 1-2 times per month but they only last about 30 seconds to 3 minutes at a time.  Patient did take his carvedilol , by the time EMS arrived him tachycardia ceased.  He was doing well at the time.  Labs revealed electrolytes were within normal limits, overall unremarkable.  EKG showed sinus rhythm.  He was told to follow-up with outpatient cardiology.  Today he presents for follow-up with his wife.  He states after his palpitations lasted for over an hour he called EMS and made it about 1/2 a mild away from the hospital, and his palpitations resolved. Does tell me his palpitations are rare when they occur. He does PD about 4 nights per week. Otherwise doing well. Has lost a significant amount of weight in the last several months. Denies any chest pain, shortness of breath, syncope, presyncope, dizziness, orthopnea, PND, swelling or significant weight  changes, acute bleeding, or claudication. Follows Dr. Rachele.   ROS: Negative. See HPI.   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  Daughter is a Scientist, clinical (histocompatibility and immunogenetics).   Studies Reviewed  EKG: EKG is not ordered today.  Echo 07/2023:  1. Left ventricular ejection fraction, by estimation, is 60 to 65%. The  left ventricle has normal function. The left ventricle has no regional  wall motion abnormalities. There is mild left ventricular hypertrophy.  Left ventricular diastolic parameters  are consistent with Grade I diastolic dysfunction (impaired relaxation).  Normal LVEDP.   2. Right ventricular systolic function is normal. The right ventricular  size is normal. Tricuspid regurgitation signal is inadequate for assessing  PA pressure.   3. The mitral valve is normal in structure. Trivial mitral valve  regurgitation. No evidence of mitral stenosis.   4. The aortic valve was not well visualized. Aortic valve regurgitation  is not visualized. No aortic stenosis is present.   Comparison(s): No significant change from prior study.  Cardiac monitor 08/2020:  30 day event monitor Min HR 52, Max HR 114, Avg HR 68 Reported symptoms correlate with sinus rhythm, rare PVCs No significant arrhythmias   LHC 02/2018:  Mid LM to Dist LM lesion is 20% stenosed. Prox Cx (Zomax Study DES STENT) is 20% stenosed. The left ventricular systolic function is normal. The left ventricular ejection fraction is 55-65% by visual estimate. LV end diastolic pressure is moderately elevated.   Widely patent circumflex stent with minimal disease  elsewhere.   Moderately elevated LVEDP   Plan: Discharge home after bed rest. Anticipate  medication adjustments with antihypertensives and possibly diuretic.  Physical Exam VS:  BP 128/72   Pulse 62   Ht 5' 10 (1.778 m)   Wt 229 lb 3.2 oz (104 kg)   SpO2 96%   BMI 32.89 kg/m        Wt Readings from Last 3 Encounters:  06/02/24 229 lb 3.2 oz (104 kg)  04/04/24 222  lb (100.7 kg)  12/19/23 244 lb 6.4 oz (110.9 kg)    GEN: Obese, 73 year old male in no acute distress NECK: No JVD; No carotid bruits CARDIAC: S1/S2, RRR, no murmurs, rubs, gallops RESPIRATORY:  Clear to auscultation without rales, wheezing or rhonchi  ABDOMEN: Soft, non-tender, non-distended EXTREMITIES:  No edema; No deformity   ASSESSMENT AND PLAN   SVT, palpitations Admits to rare episodes.  By the time patient arrived to ED in June 2025, episode had resolved on arrival.  Admits to rare episodes.  Will arrange 30-day monitor for further evaluation.  Continue current medication regimen at this time.  Most recent electrolytes that were checked were normal.  No other medication changes at this time. Heart healthy diet and regular cardiovascular exercise encouraged. Did discuss maneuvers for SVT.  Care and ED precautions discussed.  He verbalized understanding.  CAD Previous cardiovascular history of drug-eluting stent placed to OM in 2005.  Heart catheterization in 2019 showed patent stent.  Echocardiogram from 2024 revealed normal LVEF with no regional WMA. Stable with no anginal symptoms. No indication for ischemic evaluation.  Continue current medication regimen. Heart healthy diet and regular cardiovascular exercise encouraged.   HLD No recent lipid panel on file.  Will request labs from PCP's office at next office visit.  Continue current medication regimen. Heart healthy diet and regular cardiovascular exercise encouraged.   HTN Blood pressure stable. Discussed to monitor BP at home at least 2 hours after medications and sitting for 5-10 minutes.  No medication changes at this time. Heart healthy diet and regular cardiovascular exercise encouraged.   ESRD on PD Denies any issues.  Tolerates treatments well.  Continue follow-up with nephrology.   Dispo: Follow-up with MD/APP in 6 to 8 weeks or sooner if any changes.  Signed, Almarie Crate, NP

## 2024-06-03 DIAGNOSIS — Z23 Encounter for immunization: Secondary | ICD-10-CM | POA: Diagnosis not present

## 2024-06-03 DIAGNOSIS — Z992 Dependence on renal dialysis: Secondary | ICD-10-CM | POA: Diagnosis not present

## 2024-06-03 DIAGNOSIS — N186 End stage renal disease: Secondary | ICD-10-CM | POA: Diagnosis not present

## 2024-06-05 DIAGNOSIS — Z992 Dependence on renal dialysis: Secondary | ICD-10-CM | POA: Diagnosis not present

## 2024-06-05 DIAGNOSIS — N186 End stage renal disease: Secondary | ICD-10-CM | POA: Diagnosis not present

## 2024-06-05 DIAGNOSIS — Z23 Encounter for immunization: Secondary | ICD-10-CM | POA: Diagnosis not present

## 2024-06-06 DIAGNOSIS — Z992 Dependence on renal dialysis: Secondary | ICD-10-CM | POA: Diagnosis not present

## 2024-06-06 DIAGNOSIS — Z23 Encounter for immunization: Secondary | ICD-10-CM | POA: Diagnosis not present

## 2024-06-06 DIAGNOSIS — N186 End stage renal disease: Secondary | ICD-10-CM | POA: Diagnosis not present

## 2024-06-08 DIAGNOSIS — Z23 Encounter for immunization: Secondary | ICD-10-CM | POA: Diagnosis not present

## 2024-06-08 DIAGNOSIS — Z992 Dependence on renal dialysis: Secondary | ICD-10-CM | POA: Diagnosis not present

## 2024-06-08 DIAGNOSIS — N186 End stage renal disease: Secondary | ICD-10-CM | POA: Diagnosis not present

## 2024-06-09 ENCOUNTER — Other Ambulatory Visit (HOSPITAL_BASED_OUTPATIENT_CLINIC_OR_DEPARTMENT_OTHER): Payer: Self-pay

## 2024-06-10 ENCOUNTER — Ambulatory Visit: Admitting: Urology

## 2024-06-10 ENCOUNTER — Ambulatory Visit: Attending: Nurse Practitioner

## 2024-06-10 DIAGNOSIS — Z992 Dependence on renal dialysis: Secondary | ICD-10-CM | POA: Diagnosis not present

## 2024-06-10 DIAGNOSIS — I471 Supraventricular tachycardia, unspecified: Secondary | ICD-10-CM

## 2024-06-10 DIAGNOSIS — Z23 Encounter for immunization: Secondary | ICD-10-CM | POA: Diagnosis not present

## 2024-06-10 DIAGNOSIS — R002 Palpitations: Secondary | ICD-10-CM

## 2024-06-10 DIAGNOSIS — N186 End stage renal disease: Secondary | ICD-10-CM | POA: Diagnosis not present

## 2024-06-12 DIAGNOSIS — Z992 Dependence on renal dialysis: Secondary | ICD-10-CM | POA: Diagnosis not present

## 2024-06-12 DIAGNOSIS — N186 End stage renal disease: Secondary | ICD-10-CM | POA: Diagnosis not present

## 2024-06-12 DIAGNOSIS — Z23 Encounter for immunization: Secondary | ICD-10-CM | POA: Diagnosis not present

## 2024-06-13 ENCOUNTER — Encounter: Payer: Self-pay | Admitting: Nurse Practitioner

## 2024-06-13 DIAGNOSIS — Z23 Encounter for immunization: Secondary | ICD-10-CM | POA: Diagnosis not present

## 2024-06-13 DIAGNOSIS — N186 End stage renal disease: Secondary | ICD-10-CM | POA: Diagnosis not present

## 2024-06-13 DIAGNOSIS — Z992 Dependence on renal dialysis: Secondary | ICD-10-CM | POA: Diagnosis not present

## 2024-06-15 DIAGNOSIS — N186 End stage renal disease: Secondary | ICD-10-CM | POA: Diagnosis not present

## 2024-06-15 DIAGNOSIS — Z992 Dependence on renal dialysis: Secondary | ICD-10-CM | POA: Diagnosis not present

## 2024-06-15 DIAGNOSIS — Z23 Encounter for immunization: Secondary | ICD-10-CM | POA: Diagnosis not present

## 2024-06-17 ENCOUNTER — Encounter: Payer: Self-pay | Admitting: Urology

## 2024-06-17 ENCOUNTER — Ambulatory Visit: Admitting: Urology

## 2024-06-17 VITALS — BP 170/76 | HR 62

## 2024-06-17 DIAGNOSIS — Z23 Encounter for immunization: Secondary | ICD-10-CM | POA: Diagnosis not present

## 2024-06-17 DIAGNOSIS — Z992 Dependence on renal dialysis: Secondary | ICD-10-CM | POA: Diagnosis not present

## 2024-06-17 DIAGNOSIS — R339 Retention of urine, unspecified: Secondary | ICD-10-CM

## 2024-06-17 DIAGNOSIS — N401 Enlarged prostate with lower urinary tract symptoms: Secondary | ICD-10-CM

## 2024-06-17 DIAGNOSIS — R338 Other retention of urine: Secondary | ICD-10-CM | POA: Diagnosis not present

## 2024-06-17 DIAGNOSIS — N186 End stage renal disease: Secondary | ICD-10-CM | POA: Diagnosis not present

## 2024-06-17 DIAGNOSIS — R350 Frequency of micturition: Secondary | ICD-10-CM | POA: Diagnosis not present

## 2024-06-17 MED ORDER — SILODOSIN 8 MG PO CAPS: 8.0000 mg | ORAL_CAPSULE | Freq: Two times a day (BID) | ORAL | 11 refills | Status: AC

## 2024-06-17 MED ORDER — FINASTERIDE 5 MG PO TABS: 5.0000 mg | ORAL_TABLET | Freq: Every day | ORAL | 3 refills | Status: AC

## 2024-06-17 NOTE — Progress Notes (Signed)
 06/17/2024 9:01 AM   Ozell GORMAN Bras 1951/04/04 [id number]  Referring provider: Bertell Satterfield, MD [address] [place],  [place] [address]  Followup difficulty urinating   HPI: Mr Menges is a 73yo here for followup for BPH with difficulty urinating. He remains on rapaflo  8mg  BID and finasteride . IPSS 25 QOL 5. Urine stream is weak. He has intermittent straining to urinate. Nocturia 2-3x. He is unhappy with his LUTS.    PMH: Past Medical History:  Diagnosis Date   Anemia    Arthritis    Back pain    BPH (benign prostatic hyperplasia)    CAD S/P percutaneous coronary angioplasty    a. 2005: PCI OM - Zomax study stent DES  2.5 x16 mm. b. cath in 02/2018 showing 20% ISR of LCx stent and 20% stenosis along mid-distal LM.    CKD (chronic kidney disease)    Controlled type 2 diabetes with neuropathy (HCC)    Diastolic heart failure (HCC)    Grade 1   Edema of both lower extremities    Food allergy    Gout    Hypercholesterolemia    Hypertension    Hypothyroidism    Kidney disease    MI (myocardial infarction) (HCC)    in his 51s   Sleep apnea    SOB (shortness of breath)     Surgical History: Past Surgical History:  Procedure Laterality Date   APPENDECTOMY     AV FISTULA PLACEMENT Left 08/13/2023   Procedure: LEFT ARM BARCHIOCEPHALIC ARTERIOVENOUS (AV) FISTULA CREATION;  Surgeon: Magda Debby SAILOR, MD;  Location: [facility] OR;  Service: Vascular;  Laterality: Left;   CATARACT EXTRACTION Left    COLONOSCOPY N/A 02/01/2014   Procedure: COLONOSCOPY;  Surgeon: Lamar CHRISTELLA Hollingshead, MD;  Location: [facility] ENDO SUITE;  Service: Endoscopy;  Laterality: N/A;  8:30   CORONARY ANGIOPLASTY WITH STENT PLACEMENT  2005   PCI- OM1 Zomax study stent 2.5 x16 mm.   LEFT HEART CATH AND CORONARY ANGIOGRAPHY N/A 03/15/2018   Procedure: LEFT HEART CATH AND CORONARY ANGIOGRAPHY;  Surgeon: Anner Alm ORN, MD;  Location: [facility] INVASIVE CV LAB;  Service: Cardiovascular;  Laterality: N/A;    Home  Medications:  Allergies as of 06/17/2024       Reactions   Meat Extract Diarrhea, Nausea Only, Other (See Comments)   alphagal reaction   Lipitor [atorvastatin] Other (See Comments)   Muscle cramps   Simvastatin Other (See Comments)   Muscle cramps   Allopurinol  Rash   Alpha-gal Diarrhea        Medication List        Accurate as of June 17, 2024  9:01 AM. If you have any questions, ask your nurse or doctor.          acetaminophen  500 MG tablet Commonly known as: TYLENOL  Take 500 mg by mouth 2 (two) times daily as needed (for pain.).   aspirin  EC 81 MG tablet Take 81 mg by mouth every evening.   calcitRIOL 0.25 MCG capsule Commonly known as: ROCALTROL Take 0.25 mcg by mouth 3 (three) times a week.   carvedilol  25 MG tablet Commonly known as: COREG  TAKE 1 AND 1/2 TABLETS TWICE DAILY   EPINEPHrine  0.3 mg/0.3 mL Soaj injection Commonly known as: EPI-PEN Inject 0.3 mg into the muscle as needed for anaphylaxis.   Febuxostat  80 MG Tabs TAKE ONE TABLET (80 MG TOTAL) BY MOUTH DAILY.   finasteride  5 MG tablet Commonly known as: PROSCAR  Take 1 tablet (5  mg total) by mouth daily.   furosemide  20 MG tablet Commonly known as: LASIX  Take 40 mg by mouth 2 (two) times daily.   hydrALAZINE  100 MG tablet Commonly known as: APRESOLINE  TAKE 1 TABLET THREE TIMES DAILY What changed: when to take this   levothyroxine  25 MCG tablet Commonly known as: SYNTHROID  Take 25 mcg by mouth daily before breakfast. Take with 200 mg   losartan  100 MG tablet Commonly known as: COZAAR  Take 100 mg by mouth daily.   Mitigare 0.6 MG Caps Generic drug: Colchicine Take 0.6 mg by mouth 2 (two) times daily as needed (gout flare).   Mounjaro  10 MG/0.5ML Pen Generic drug: tirzepatide  Inject 10 mg into the skin once a week.   Mounjaro  10 MG/0.5ML Pen Generic drug: tirzepatide  Inject 10 mg into the skin once a week.   omega-3 acid ethyl esters 1 g capsule Commonly known as:  LOVAZA  TAKE ONE CAPSULE BY MOUTH EVERY 12 HOURS   PARoxetine 10 MG tablet Commonly known as: PAXIL Take 10 mg by mouth daily.   rosuvastatin  5 MG tablet Commonly known as: CRESTOR  Take 5 mg by mouth daily.   silodosin  8 MG Caps capsule Commonly known as: RAPAFLO  Take 1 capsule (8 mg total) by mouth in the morning and at bedtime.        Allergies:  Allergies  Allergen Reactions   Meat Extract Diarrhea, Nausea Only and Other (See Comments)    alphagal reaction   Lipitor [Atorvastatin] Other (See Comments)    Muscle cramps   Simvastatin Other (See Comments)    Muscle cramps    Allopurinol  Rash   Alpha-Gal Diarrhea    Family History: Family History  Problem Relation Age of Onset   Colon polyps Mother    Hypertension Mother    High Cholesterol Father    Heart disease Father    COPD Father    Thyroid  disease Daughter    Healthy Daughter    Healthy Other    Colon cancer Neg Hx     Social History:  reports that he quit smoking about 20 years ago. His smoking use included cigarettes. He started smoking about 50 years ago. He has a 60 pack-year smoking history. He has never used smokeless tobacco. He reports that he does not currently use alcohol. He reports that he does not use drugs.  ROS: All other review of systems were reviewed and are negative except what is noted above in HPI  Physical Exam: BP (!) 170/76   Pulse 62   Constitutional:  Alert and oriented, No acute distress. HEENT: [place] AT, moist mucus membranes.  Trachea midline, no masses. Cardiovascular: No clubbing, cyanosis, or edema. Respiratory: Normal respiratory effort, no increased work of breathing. GI: Abdomen is soft, nontender, nondistended, no abdominal masses GU: No CVA tenderness.  Lymph: No cervical or inguinal lymphadenopathy. Skin: No rashes, bruises or suspicious lesions. Neurologic: Grossly intact, no focal deficits, moving all 4 extremities. Psychiatric: Normal mood and  affect.  Laboratory Data: Lab Results  Component Value Date   WBC 12.6 (H) 04/04/2024   HGB 11.6 (L) 04/04/2024   HCT 35.8 (L) 04/04/2024   MCV 90.9 04/04/2024   PLT 241 04/04/2024    Lab Results  Component Value Date   CREATININE 4.44 (H) 04/04/2024    No results found for: PSA  No results found for: TESTOSTERONE   Lab Results  Component Value Date   HGBA1C 6.5 (H) 11/20/2022    Urinalysis    Component Value Date/Time  COLORURINE STRAW (A) 12/19/2023 0930   APPEARANCEUR CLEAR 12/19/2023 0930   APPEARANCEUR Clear 12/04/2015 1038   LABSPEC 1.011 12/19/2023 0930   PHURINE 5.0 12/19/2023 0930   GLUCOSEU 50 (A) 12/19/2023 0930   HGBUR SMALL (A) 12/19/2023 0930   BILIRUBINUR NEGATIVE 12/19/2023 0930   BILIRUBINUR Negative 12/04/2015 1038   KETONESUR NEGATIVE 12/19/2023 0930   PROTEINUR 100 (A) 12/19/2023 0930   NITRITE NEGATIVE 12/19/2023 0930   LEUKOCYTESUR NEGATIVE 12/19/2023 0930    Lab Results  Component Value Date   LABMICR See below: 12/04/2015   WBCUA 0-5 12/04/2015   RBCUA 0-2 12/04/2015   LABEPIT 0-10 12/04/2015   BACTERIA RARE (A) 12/19/2023    Pertinent Imaging:  No results found for this or any previous visit.  No results found for this or any previous visit.  No results found for this or any previous visit.  No results found for this or any previous visit.  Results for orders placed during the hospital encounter of 05/13/21  US  RENAL  Narrative CLINICAL DATA:  Acute renal failure  EXAM: RENAL / URINARY TRACT ULTRASOUND COMPLETE  COMPARISON:  Ultrasound 11/05/2017  FINDINGS: Right Kidney:  Renal measurements: 15.2 x 6.8 x 7.7 cm = volume: 416.7 mL. Echogenicity within normal limits. No mass or hydronephrosis visualized.  Left Kidney:  Renal measurements: 13 x 6.8 x 5.7 cm = volume: 263.4 mL. Echogenicity within normal limits. No hydronephrosis. Small cyst in the upper pole measuring up to 15 mm.  Bladder:  Appears  normal for degree of bladder distention.  Other:  None.  IMPRESSION: 1. Kidneys appear large in size bilaterally but without hydronephrosis nor significant change compared to prior ultrasound. 2. Small cysts in the left kidney   Electronically Signed By: Luke Bun M.D. On: 05/13/2021 22:16  No results found for this or any previous visit.  No results found for this or any previous visit.  Results for orders placed during the hospital encounter of 11/19/22  CT Renal Stone Study  Narrative CLINICAL DATA:  Hematuria.  EXAM: CT ABDOMEN AND PELVIS WITHOUT CONTRAST  TECHNIQUE: Multidetector CT imaging of the abdomen and pelvis was performed following the standard protocol without IV contrast.  RADIATION DOSE REDUCTION: This exam was performed according to the departmental dose-optimization program which includes automated exposure control, adjustment of the mA and/or kV according to patient size and/or use of iterative reconstruction technique.  COMPARISON:  None Available.  FINDINGS: Lower chest: No acute abnormality.  Hepatobiliary: No focal liver abnormality is seen. The gallbladder is moderately distended. No gallstones, gallbladder wall thickening, or biliary dilatation.  Pancreas: Punctate calcifications are seen scattered throughout the pancreatic parenchyma. No pancreatic ductal dilatation or surrounding inflammatory changes.  Spleen: A 2.1 cm diameter area of low attenuation is within an otherwise normal-appearing spleen.  Adrenals/Urinary Tract: Adrenal glands are unremarkable. Kidneys are normal in size, without renal calculi, focal lesion, or hydronephrosis. The urinary bladder is poorly distended and subsequently limited in evaluation. Moderate severity diffuse urinary bladder wall thickening is noted. Very mild hazy surrounding inflammatory fat stranding is seen.  Stomach/Bowel: Stomach is within normal limits. The appendix is not identified.  No evidence of bowel wall thickening, distention, or inflammatory changes. Noninflamed diverticula are seen throughout the descending and sigmoid colon.  Vascular/Lymphatic: Aortic atherosclerosis. No enlarged abdominal or pelvic lymph nodes.  Reproductive: The prostate gland is mildly enlarged. Mild prostate gland calcification is also noted.  Other: No abdominal wall hernia or abnormality. No abdominopelvic ascites.  Musculoskeletal:  There is grade 2 anterolisthesis of the L5 vertebral body on S1 with marked severity multilevel degenerative changes noted throughout the lumbar spine.  IMPRESSION: 1. Findings consistent with acute cystitis. Correlation with urinalysis is recommended. 2. Colonic diverticulosis. 3. Grade 2 anterolisthesis of the L5 vertebral body on S1 with marked severity multilevel degenerative changes throughout the lumbar spine. 4. 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 Correlation with nonemergent splenic ultrasound is recommended. 5. Aortic atherosclerosis.  Aortic Atherosclerosis (ICD10-I70.0).   Electronically Signed By: Suzen Dials M.D. On: 11/19/2022 23:38   Assessment & Plan:    1. Benign prostatic hyperplasia with incomplete bladder emptying (Primary) We discussed the management of his BPH including continued medical therapy, Rezum, Urolift, TURP and simple prostatectomy. After discussing the options the patient has elected to proceed with medical therapy. He is interested in Urolift and will call if he desires cystoscopy.   - Urinalysis, Routine w reflex microscopic  2. Urinary retention Continue rapaflo  8mg  BID and finasteride  5mg  daily  3. Urinary frequency Continue rapaflo  8mg  BID and finasteride  5mg  daily.    No follow-ups on file.  Belvie Clara, MD  [facility] [place]

## 2024-06-17 NOTE — Patient Instructions (Signed)

## 2024-06-19 DIAGNOSIS — Z992 Dependence on renal dialysis: Secondary | ICD-10-CM | POA: Diagnosis not present

## 2024-06-19 DIAGNOSIS — Z23 Encounter for immunization: Secondary | ICD-10-CM | POA: Diagnosis not present

## 2024-06-19 DIAGNOSIS — N186 End stage renal disease: Secondary | ICD-10-CM | POA: Diagnosis not present

## 2024-06-22 DIAGNOSIS — Z23 Encounter for immunization: Secondary | ICD-10-CM | POA: Diagnosis not present

## 2024-06-22 DIAGNOSIS — Z992 Dependence on renal dialysis: Secondary | ICD-10-CM | POA: Diagnosis not present

## 2024-06-22 DIAGNOSIS — N186 End stage renal disease: Secondary | ICD-10-CM | POA: Diagnosis not present

## 2024-06-24 DIAGNOSIS — Z992 Dependence on renal dialysis: Secondary | ICD-10-CM | POA: Diagnosis not present

## 2024-06-24 DIAGNOSIS — N186 End stage renal disease: Secondary | ICD-10-CM | POA: Diagnosis not present

## 2024-06-24 DIAGNOSIS — Z23 Encounter for immunization: Secondary | ICD-10-CM | POA: Diagnosis not present

## 2024-06-27 DIAGNOSIS — Z992 Dependence on renal dialysis: Secondary | ICD-10-CM | POA: Diagnosis not present

## 2024-06-27 DIAGNOSIS — N186 End stage renal disease: Secondary | ICD-10-CM | POA: Diagnosis not present

## 2024-06-29 DIAGNOSIS — N186 End stage renal disease: Secondary | ICD-10-CM | POA: Diagnosis not present

## 2024-06-29 DIAGNOSIS — Z992 Dependence on renal dialysis: Secondary | ICD-10-CM | POA: Diagnosis not present

## 2024-06-30 DIAGNOSIS — N186 End stage renal disease: Secondary | ICD-10-CM | POA: Diagnosis not present

## 2024-06-30 DIAGNOSIS — Z992 Dependence on renal dialysis: Secondary | ICD-10-CM | POA: Diagnosis not present

## 2024-07-01 DIAGNOSIS — Z992 Dependence on renal dialysis: Secondary | ICD-10-CM | POA: Diagnosis not present

## 2024-07-01 DIAGNOSIS — N186 End stage renal disease: Secondary | ICD-10-CM | POA: Diagnosis not present

## 2024-07-04 DIAGNOSIS — N186 End stage renal disease: Secondary | ICD-10-CM | POA: Diagnosis not present

## 2024-07-04 DIAGNOSIS — Z992 Dependence on renal dialysis: Secondary | ICD-10-CM | POA: Diagnosis not present

## 2024-07-06 DIAGNOSIS — Z992 Dependence on renal dialysis: Secondary | ICD-10-CM | POA: Diagnosis not present

## 2024-07-06 DIAGNOSIS — N186 End stage renal disease: Secondary | ICD-10-CM | POA: Diagnosis not present

## 2024-07-08 DIAGNOSIS — N186 End stage renal disease: Secondary | ICD-10-CM | POA: Diagnosis not present

## 2024-07-08 DIAGNOSIS — Z992 Dependence on renal dialysis: Secondary | ICD-10-CM | POA: Diagnosis not present

## 2024-07-11 DIAGNOSIS — Z992 Dependence on renal dialysis: Secondary | ICD-10-CM | POA: Diagnosis not present

## 2024-07-11 DIAGNOSIS — N186 End stage renal disease: Secondary | ICD-10-CM | POA: Diagnosis not present

## 2024-07-11 NOTE — Addendum Note (Signed)
 Addended by: Treyvon Blahut on: 07/11/2024 09:48 AM   Modules accepted: Orders

## 2024-07-13 DIAGNOSIS — N184 Chronic kidney disease, stage 4 (severe): Secondary | ICD-10-CM | POA: Diagnosis not present

## 2024-07-13 DIAGNOSIS — N185 Chronic kidney disease, stage 5: Secondary | ICD-10-CM | POA: Diagnosis not present

## 2024-07-13 DIAGNOSIS — N186 End stage renal disease: Secondary | ICD-10-CM | POA: Diagnosis not present

## 2024-07-13 DIAGNOSIS — Z992 Dependence on renal dialysis: Secondary | ICD-10-CM | POA: Diagnosis not present

## 2024-07-15 DIAGNOSIS — Z992 Dependence on renal dialysis: Secondary | ICD-10-CM | POA: Diagnosis not present

## 2024-07-15 DIAGNOSIS — N186 End stage renal disease: Secondary | ICD-10-CM | POA: Diagnosis not present

## 2024-07-18 DIAGNOSIS — N186 End stage renal disease: Secondary | ICD-10-CM | POA: Diagnosis not present

## 2024-07-18 DIAGNOSIS — Z992 Dependence on renal dialysis: Secondary | ICD-10-CM | POA: Diagnosis not present

## 2024-07-20 DIAGNOSIS — Z992 Dependence on renal dialysis: Secondary | ICD-10-CM | POA: Diagnosis not present

## 2024-07-20 DIAGNOSIS — N186 End stage renal disease: Secondary | ICD-10-CM | POA: Diagnosis not present

## 2024-07-20 DIAGNOSIS — N2581 Secondary hyperparathyroidism of renal origin: Secondary | ICD-10-CM | POA: Diagnosis not present

## 2024-07-22 DIAGNOSIS — N186 End stage renal disease: Secondary | ICD-10-CM | POA: Diagnosis not present

## 2024-07-22 DIAGNOSIS — N2581 Secondary hyperparathyroidism of renal origin: Secondary | ICD-10-CM | POA: Diagnosis not present

## 2024-07-22 DIAGNOSIS — Z992 Dependence on renal dialysis: Secondary | ICD-10-CM | POA: Diagnosis not present

## 2024-07-25 ENCOUNTER — Encounter: Payer: Self-pay | Admitting: Nurse Practitioner

## 2024-07-25 ENCOUNTER — Ambulatory Visit: Attending: Nurse Practitioner | Admitting: Nurse Practitioner

## 2024-07-25 VITALS — BP 152/72 | HR 72 | Ht 69.5 in | Wt 232.6 lb

## 2024-07-25 DIAGNOSIS — Z992 Dependence on renal dialysis: Secondary | ICD-10-CM

## 2024-07-25 DIAGNOSIS — E785 Hyperlipidemia, unspecified: Secondary | ICD-10-CM

## 2024-07-25 DIAGNOSIS — N2581 Secondary hyperparathyroidism of renal origin: Secondary | ICD-10-CM | POA: Diagnosis not present

## 2024-07-25 DIAGNOSIS — I714 Abdominal aortic aneurysm, without rupture, unspecified: Secondary | ICD-10-CM

## 2024-07-25 DIAGNOSIS — N186 End stage renal disease: Secondary | ICD-10-CM

## 2024-07-25 DIAGNOSIS — I10 Essential (primary) hypertension: Secondary | ICD-10-CM | POA: Diagnosis not present

## 2024-07-25 DIAGNOSIS — I2510 Atherosclerotic heart disease of native coronary artery without angina pectoris: Secondary | ICD-10-CM | POA: Diagnosis not present

## 2024-07-25 DIAGNOSIS — Z87898 Personal history of other specified conditions: Secondary | ICD-10-CM

## 2024-07-25 DIAGNOSIS — I471 Supraventricular tachycardia, unspecified: Secondary | ICD-10-CM

## 2024-07-25 MED ORDER — AMLODIPINE BESYLATE 2.5 MG PO TABS
2.5000 mg | ORAL_TABLET | Freq: Every day | ORAL | 3 refills | Status: AC
Start: 2024-07-25 — End: 2024-10-23

## 2024-07-25 NOTE — Progress Notes (Unsigned)
 Cardiology Office Note   Date:  07/25/2024 ID:  Brandon Warren, Brandon Warren 1951-02-27, MRN [id number] PCP: [facility] Providers Cardiologist:  Alvan Carrier, MD     History of Present Illness Brandon Warren is a 73 y.o. male with a PMH of CAD, DOE, hyperlipidemia, hypertension, SVT, PVCs, palpitations, AAA, ESRD on PD, who presents today for 6-8 week follow-up.  Previous cardiovascular history of drug-eluting stent placed to OM in 2005.  Heart catheterization in 2019 showed patent stent.  Echocardiogram from 2024 revealed normal LVEF with no regional WMA.  Last seen by Laymon Qua, PA-C on December 04, 2023.  At that time, he was planning to undergo peritoneal dialysis catheter placement as his fistula did not take.  He had lost 30 pounds in the past few months due to dietary changes but also due to needing dentures.  Reported having been hypotensive at times, but overall doing well.  ED visit on April 04, 2024 at [facility] due to tachycardia.  Heart rate around 150 bpm, lasted 1 hour.  He reported episodes happen around 1-2 times per month but they only last about 30 seconds to 3 minutes at a time.  Patient did take his carvedilol , by the time EMS arrived him tachycardia ceased.  He was doing well at the time.  Labs revealed electrolytes were within normal limits, overall unremarkable.  EKG showed sinus rhythm.  He was told to follow-up with outpatient cardiology.  06/02/2024 - Today he presents for follow-up with his wife.  He states after his palpitations lasted for over an hour he called EMS and made it about 1/2 a mild away from the hospital, and his palpitations resolved. Does tell me his palpitations are rare when they occur. He does PD about 4 nights per week. Otherwise doing well. Has lost a significant amount of weight in the last several months. Denies any chest pain, shortness of breath, syncope, presyncope, dizziness, orthopnea, PND,  swelling or significant weight changes, acute bleeding, or claudication. Follows Dr. Rachele.   07/25/2024 - Here for follow-up.  Doing well.  Chief concern is BP seems to be more elevated.  Admits to SBP in the 170s noted at home. Denies any chest pain, shortness of breath, palpitations, syncope, presyncope, dizziness, orthopnea, PND, swelling or significant weight changes, acute bleeding, or claudication. Wants to know when his study should be performed to evaluate his AAA.    ROS: Negative. See HPI.   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  Daughter is a Scientist, clinical (histocompatibility and immunogenetics).   Studies Reviewed  EKG: EKG is not ordered today.  Cardiac monitor 06/2024:  See scanned report under tab CV Proc   Echo 07/2023:  1. Left ventricular ejection fraction, by estimation, is 60 to 65%. The  left ventricle has normal function. The left ventricle has no regional  wall motion abnormalities. There is mild left ventricular hypertrophy.  Left ventricular diastolic parameters  are consistent with Grade I diastolic dysfunction (impaired relaxation).  Normal LVEDP.   2. Right ventricular systolic function is normal. The right ventricular  size is normal. Tricuspid regurgitation signal is inadequate for assessing  PA pressure.   3. The mitral valve is normal in structure. Trivial mitral valve  regurgitation. No evidence of mitral stenosis.   4. The aortic valve was not well visualized. Aortic valve regurgitation  is not visualized. No aortic stenosis is present.   Comparison(s): No significant change from prior study.  Cardiac  monitor 08/2020:  30 day event monitor Min HR 52, Max HR 114, Avg HR 68 Reported symptoms correlate with sinus rhythm, rare PVCs No significant arrhythmias   LHC 02/2018:  Mid LM to Dist LM lesion is 20% stenosed. Prox Cx (Zomax Study DES STENT) is 20% stenosed. The left ventricular systolic function is normal. The left ventricular ejection fraction is 55-65% by visual estimate. LV  end diastolic pressure is moderately elevated.   Widely patent circumflex stent with minimal disease elsewhere.   Moderately elevated LVEDP   Plan: Discharge home after bed rest. Anticipate  medication adjustments with antihypertensives and possibly diuretic.  Physical Exam VS:  BP (!) 152/72   Pulse 72   Ht 5' 9.5 (1.765 m)   Wt 232 lb 9.6 oz (105.5 kg)   SpO2 95%   BMI 33.86 kg/m        Wt Readings from Last 3 Encounters:  07/25/24 232 lb 9.6 oz (105.5 kg)  06/02/24 229 lb 3.2 oz (104 kg)  04/04/24 222 lb (100.7 kg)    GEN: Obese, 73 year old male in no acute distress NECK: No JVD; No carotid bruits CARDIAC: S1/S2, RRR, no murmurs, rubs, gallops RESPIRATORY:  Clear to auscultation without rales, wheezing or rhonchi  ABDOMEN: Soft, non-tender, non-distended EXTREMITIES:  No edema; No deformity   ASSESSMENT AND PLAN   SVT, hx of palpitations Denies any recent palpitations or tachycardia.  I went over his event monitor that was overall reassuring.  Predominant reading was sinus rhythm, average heart rate 66 bpm with less than 1% burden of ventricular ectopic beats, had 2 occurrences of V. tach with longest episode lasting 11 seconds, appears he was asymptomatic with this. Continue current medication regimen at this time. Heart healthy diet and regular cardiovascular exercise encouraged.  Previously did discuss maneuvers for SVT.  Care and ED precautions discussed.  He verbalized understanding.  CAD Previous cardiovascular history of drug-eluting stent placed to OM in 2005.  Heart catheterization in 2019 showed patent stent.  Echocardiogram from 2024 revealed normal LVEF with no regional WMA. Stable with no anginal symptoms. No indication for ischemic evaluation.  Continue current medication regimen. Heart healthy diet and regular cardiovascular exercise encouraged.   HLD No recent lipid panel on file.  Will request labs from PCP's office.  Continue current medication regimen.  Heart healthy diet and regular cardiovascular exercise encouraged.   HTN Blood pressure elevated, getting elevated readings at home. Discussed to monitor BP at home at least 2 hours after medications and sitting for 5-10 minutes.  Will begin Norvasc  2.5 mg daily and he will provide an update for us  in 2 to 3 weeks with his BP trends.  No other medication changes at this time. Heart healthy diet and regular cardiovascular exercise encouraged.   ESRD on PD Denies any issues.  Tolerates treatments well.  Continue follow-up with nephrology.  6. AAA Abdominal ultrasound from 2022 showed abdominal aortic aneurysm measuring 3.2 cm and was recommended follow-up ultrasound every 3 years.  He will be due this December and we will arrange this.  Dispo: Follow-up with MD/APP in 6 months or sooner if any changes.  Signed, Almarie Crate, NP

## 2024-07-25 NOTE — Patient Instructions (Signed)
 Medication Instructions:  Your physician has recommended you make the following change in your medication:   -Start Amlodipine  2.5 mg once daily   *If you need a refill on your cardiac medications before your next appointment, please call your pharmacy*  Lab Work: None If you have labs (blood work) drawn today and your tests are completely normal, you will receive your results only by: MyChart Message (if you have MyChart) OR A paper copy in the mail If you have any lab test that is abnormal or we need to change your treatment, we will call you to review the results.  Testing/Procedures: Your physician has requested that you have an abdominal aorta duplex. During this test, an ultrasound is used to evaluate the aorta. Allow 30 minutes for this exam. Do not eat after midnight the day before and avoid carbonated beverages.  Please note: We ask at that you not bring children with you during ultrasound (echo/ vascular) testing. Due to room size and safety concerns, children are not allowed in the ultrasound rooms during exams. Our front office staff cannot provide observation of children in our lobby area while testing is being conducted. An adult accompanying a patient to their appointment will only be allowed in the ultrasound room at the discretion of the ultrasound technician under special circumstances. We apologize for any inconvenience.   Follow-Up: At [facility], you and your health needs are our priority.  As part of our continuing mission to provide you with exceptional heart care, our providers are all part of one team.  This team includes your primary Cardiologist (physician) and Advanced Practice Providers or APPs (Physician Assistants and Nurse Practitioners) who all work together to provide you with the care you need, when you need it.  Your next appointment:   6 month(s)  Provider:   You may see Alvan Carrier, MD or the following Advanced Practice Provider on your  designated Care Team:   Almarie Crate, NP    We recommend signing up for the patient portal called MyChart.  Sign up information is provided on this After Visit Summary.  MyChart is used to connect with patients for Virtual Visits (Telemedicine).  Patients are able to view lab/test results, encounter notes, upcoming appointments, etc.  Non-urgent messages can be sent to your provider as well.   To learn more about what you can do with MyChart, go to [email or web address].   Other Instructions

## 2024-08-11 ENCOUNTER — Encounter: Payer: Self-pay | Admitting: Nurse Practitioner

## 2024-09-02 ENCOUNTER — Telehealth: Payer: Self-pay | Admitting: Nurse Practitioner

## 2024-09-02 NOTE — Telephone Encounter (Signed)
 Pt would like E. Miriam to know two weeks ago he left a log of BP readings with the office after adding amlodipine  to his medications. Pt is requesting callback to discuss medication management.

## 2024-09-06 NOTE — Telephone Encounter (Signed)
 Informed patient that I have not been able to locate any BP log.  Patient states that he does home dialysis & takes his readings every day.  He will make a new list & drop off at the office.

## 2024-09-19 ENCOUNTER — Other Ambulatory Visit: Payer: Self-pay | Admitting: Cardiology

## 2024-10-04 ENCOUNTER — Other Ambulatory Visit

## 2024-10-18 ENCOUNTER — Ambulatory Visit (HOSPITAL_COMMUNITY)
Admission: RE | Admit: 2024-10-18 | Discharge: 2024-10-18 | Disposition: A | Discharge status: Home / Self Care | Source: Ambulatory Visit | Attending: Nurse Practitioner | Admitting: Nurse Practitioner

## 2024-10-18 ENCOUNTER — Other Ambulatory Visit: Payer: Self-pay | Admitting: Nurse Practitioner

## 2024-10-18 DIAGNOSIS — N186 End stage renal disease: Secondary | ICD-10-CM

## 2024-10-18 DIAGNOSIS — I471 Supraventricular tachycardia, unspecified: Secondary | ICD-10-CM

## 2024-10-18 DIAGNOSIS — I714 Abdominal aortic aneurysm, without rupture, unspecified: Secondary | ICD-10-CM

## 2024-10-18 DIAGNOSIS — Z87898 Personal history of other specified conditions: Secondary | ICD-10-CM

## 2024-10-18 DIAGNOSIS — E785 Hyperlipidemia, unspecified: Secondary | ICD-10-CM

## 2024-10-18 DIAGNOSIS — I2510 Atherosclerotic heart disease of native coronary artery without angina pectoris: Secondary | ICD-10-CM

## 2024-10-18 DIAGNOSIS — I10 Essential (primary) hypertension: Secondary | ICD-10-CM

## 2024-10-24 ENCOUNTER — Other Ambulatory Visit (HOSPITAL_COMMUNITY): Payer: Self-pay

## 2024-10-25 ENCOUNTER — Other Ambulatory Visit: Payer: Self-pay

## 2024-10-25 ENCOUNTER — Other Ambulatory Visit (HOSPITAL_COMMUNITY): Payer: Self-pay

## 2024-10-25 MED ORDER — MOUNJARO 12.5 MG/0.5ML ~~LOC~~ SOAJ
12.5000 mg | SUBCUTANEOUS | 3 refills | Status: AC
  Filled 2024-10-25 (×2): qty 6, 84d supply, fill #0

## 2024-11-04 ENCOUNTER — Ambulatory Visit: Admitting: Urology

## 2024-11-11 ENCOUNTER — Ambulatory Visit: Payer: Self-pay | Admitting: Nurse Practitioner

## 2024-11-14 ENCOUNTER — Telehealth: Payer: Self-pay | Admitting: Nurse Practitioner

## 2024-11-14 ENCOUNTER — Ambulatory Visit: Payer: Self-pay | Admitting: Nurse Practitioner

## 2024-11-14 NOTE — Telephone Encounter (Signed)
 The patient has been notified of the result and verbalized understanding.  All questions (if any) were answered. Rosina JAYSON Cornea, CMA 11/14/2024 5:15 PM

## 2024-11-14 NOTE — Telephone Encounter (Signed)
 Pt returning your call regarding recent results.

## 2024-11-24 ENCOUNTER — Other Ambulatory Visit (HOSPITAL_COMMUNITY): Payer: Self-pay

## 2024-11-28 ENCOUNTER — Ambulatory Visit: Payer: Self-pay | Admitting: Nurse Practitioner

## 2025-01-24 ENCOUNTER — Ambulatory Visit: Admitting: Cardiology
